# Patient Record
Sex: Female | Born: 1955 | Race: White | Hispanic: No | Smoking: Never smoker
Health system: Southern US, Community
[De-identification: ages and names within clinical notes are randomized; demographics above are authoritative.]

## PROBLEM LIST (undated history)

## (undated) DIAGNOSIS — I2510 Atherosclerotic heart disease of native coronary artery without angina pectoris: Secondary | ICD-10-CM

## (undated) DIAGNOSIS — F419 Anxiety disorder, unspecified: Secondary | ICD-10-CM

## (undated) DIAGNOSIS — I209 Angina pectoris, unspecified: Secondary | ICD-10-CM

## (undated) DIAGNOSIS — K219 Gastro-esophageal reflux disease without esophagitis: Secondary | ICD-10-CM

## (undated) DIAGNOSIS — E1140 Type 2 diabetes mellitus with diabetic neuropathy, unspecified: Secondary | ICD-10-CM

## (undated) DIAGNOSIS — Z9889 Other specified postprocedural states: Secondary | ICD-10-CM

## (undated) DIAGNOSIS — G4730 Sleep apnea, unspecified: Secondary | ICD-10-CM

## (undated) DIAGNOSIS — C189 Malignant neoplasm of colon, unspecified: Secondary | ICD-10-CM

## (undated) DIAGNOSIS — I639 Cerebral infarction, unspecified: Secondary | ICD-10-CM

## (undated) DIAGNOSIS — E7800 Pure hypercholesterolemia, unspecified: Secondary | ICD-10-CM

## (undated) DIAGNOSIS — M1990 Unspecified osteoarthritis, unspecified site: Secondary | ICD-10-CM

## (undated) DIAGNOSIS — R112 Nausea with vomiting, unspecified: Secondary | ICD-10-CM

## (undated) DIAGNOSIS — I519 Heart disease, unspecified: Secondary | ICD-10-CM

## (undated) DIAGNOSIS — I10 Essential (primary) hypertension: Secondary | ICD-10-CM

## (undated) DIAGNOSIS — E039 Hypothyroidism, unspecified: Secondary | ICD-10-CM

## (undated) DIAGNOSIS — Z85828 Personal history of other malignant neoplasm of skin: Secondary | ICD-10-CM

## (undated) DIAGNOSIS — E079 Disorder of thyroid, unspecified: Secondary | ICD-10-CM

## (undated) DIAGNOSIS — R079 Chest pain, unspecified: Secondary | ICD-10-CM

## (undated) DIAGNOSIS — F410 Panic disorder [episodic paroxysmal anxiety] without agoraphobia: Secondary | ICD-10-CM

## (undated) DIAGNOSIS — I219 Acute myocardial infarction, unspecified: Secondary | ICD-10-CM

## (undated) HISTORY — PX: KNEE SURGERY: SHX244

## (undated) HISTORY — PX: JOINT REPLACEMENT: SHX530

## (undated) HISTORY — PX: ABDOMINAL HYSTERECTOMY: SHX81

## (undated) HISTORY — PX: CHOLECYSTECTOMY: SHX55

## (undated) HISTORY — DX: Malignant neoplasm of colon, unspecified: C18.9

## (undated) HISTORY — DX: Hypothyroidism, unspecified: E03.9

## (undated) HISTORY — PX: CORONARY ANGIOPLASTY WITH STENT PLACEMENT: SHX49

## (undated) HISTORY — DX: Gastro-esophageal reflux disease without esophagitis: K21.9

## (undated) HISTORY — DX: Sleep apnea, unspecified: G47.30

## (undated) HISTORY — PX: BACK SURGERY: SHX140

## (undated) HISTORY — DX: Panic disorder (episodic paroxysmal anxiety): F41.0

## (undated) HISTORY — PX: ELBOW SURGERY: SHX618

## (undated) HISTORY — DX: Heart disease, unspecified: I51.9

## (undated) HISTORY — DX: Anxiety disorder, unspecified: F41.9

## (undated) HISTORY — DX: Pure hypercholesterolemia, unspecified: E78.00

## (undated) HISTORY — DX: Chest pain, unspecified: R07.9

---

## 1996-02-03 DIAGNOSIS — I219 Acute myocardial infarction, unspecified: Secondary | ICD-10-CM

## 1996-02-03 HISTORY — DX: Acute myocardial infarction, unspecified: I21.9

## 1999-05-29 ENCOUNTER — Ambulatory Visit (HOSPITAL_COMMUNITY): Admission: RE | Admit: 1999-05-29 | Discharge: 1999-05-29 | Payer: Self-pay | Admitting: Neurosurgery

## 1999-05-29 ENCOUNTER — Encounter: Payer: Self-pay | Admitting: Neurosurgery

## 1999-06-12 ENCOUNTER — Ambulatory Visit (HOSPITAL_COMMUNITY): Admission: RE | Admit: 1999-06-12 | Discharge: 1999-06-12 | Payer: Self-pay | Admitting: Neurosurgery

## 1999-06-12 ENCOUNTER — Encounter: Payer: Self-pay | Admitting: Neurosurgery

## 2000-08-27 ENCOUNTER — Encounter: Admission: RE | Admit: 2000-08-27 | Discharge: 2000-08-27 | Payer: Self-pay | Admitting: Neurosurgery

## 2000-08-27 ENCOUNTER — Encounter: Payer: Self-pay | Admitting: Neurosurgery

## 2000-09-07 ENCOUNTER — Ambulatory Visit (HOSPITAL_COMMUNITY): Admission: RE | Admit: 2000-09-07 | Discharge: 2000-09-07 | Payer: Self-pay | Admitting: Pulmonary Disease

## 2000-10-01 ENCOUNTER — Encounter: Payer: Self-pay | Admitting: Neurosurgery

## 2000-10-05 ENCOUNTER — Encounter: Payer: Self-pay | Admitting: Neurosurgery

## 2000-10-05 ENCOUNTER — Inpatient Hospital Stay (HOSPITAL_COMMUNITY): Admission: RE | Admit: 2000-10-05 | Discharge: 2000-10-12 | Payer: Self-pay | Admitting: Neurosurgery

## 2000-10-26 ENCOUNTER — Inpatient Hospital Stay (HOSPITAL_COMMUNITY): Admission: RE | Admit: 2000-10-26 | Discharge: 2000-11-11 | Payer: Self-pay | Admitting: Neurosurgery

## 2000-11-01 ENCOUNTER — Encounter: Payer: Self-pay | Admitting: Neurosurgery

## 2000-12-09 ENCOUNTER — Encounter: Payer: Self-pay | Admitting: Neurosurgery

## 2000-12-09 ENCOUNTER — Encounter: Admission: RE | Admit: 2000-12-09 | Discharge: 2000-12-09 | Payer: Self-pay | Admitting: Neurosurgery

## 2001-04-05 ENCOUNTER — Encounter: Admission: RE | Admit: 2001-04-05 | Discharge: 2001-04-05 | Payer: Self-pay | Admitting: Internal Medicine

## 2001-06-19 ENCOUNTER — Ambulatory Visit (HOSPITAL_COMMUNITY): Admission: RE | Admit: 2001-06-19 | Discharge: 2001-06-19 | Payer: Self-pay | Admitting: Neurosurgery

## 2001-06-19 ENCOUNTER — Encounter: Payer: Self-pay | Admitting: Neurosurgery

## 2001-07-12 ENCOUNTER — Ambulatory Visit (HOSPITAL_COMMUNITY): Admission: RE | Admit: 2001-07-12 | Discharge: 2001-07-12 | Payer: Self-pay | Admitting: Neurosurgery

## 2001-07-12 ENCOUNTER — Encounter: Payer: Self-pay | Admitting: Neurosurgery

## 2001-08-03 ENCOUNTER — Emergency Department (HOSPITAL_COMMUNITY): Admission: EM | Admit: 2001-08-03 | Discharge: 2001-08-04 | Payer: Self-pay | Admitting: Internal Medicine

## 2001-08-04 ENCOUNTER — Encounter: Payer: Self-pay | Admitting: Internal Medicine

## 2001-11-28 ENCOUNTER — Ambulatory Visit (HOSPITAL_COMMUNITY): Admission: RE | Admit: 2001-11-28 | Discharge: 2001-11-28 | Payer: Self-pay | Admitting: Pulmonary Disease

## 2001-12-08 ENCOUNTER — Ambulatory Visit (HOSPITAL_COMMUNITY): Admission: RE | Admit: 2001-12-08 | Discharge: 2001-12-08 | Payer: Self-pay | Admitting: Pulmonary Disease

## 2002-07-21 ENCOUNTER — Ambulatory Visit (HOSPITAL_COMMUNITY): Admission: RE | Admit: 2002-07-21 | Discharge: 2002-07-21 | Payer: Self-pay | Admitting: Pulmonary Disease

## 2002-08-14 ENCOUNTER — Encounter: Payer: Self-pay | Admitting: *Deleted

## 2002-08-14 ENCOUNTER — Encounter (HOSPITAL_COMMUNITY): Admission: RE | Admit: 2002-08-14 | Discharge: 2002-09-13 | Payer: Self-pay | Admitting: *Deleted

## 2002-09-08 ENCOUNTER — Ambulatory Visit (HOSPITAL_COMMUNITY): Admission: RE | Admit: 2002-09-08 | Discharge: 2002-09-08 | Payer: Self-pay | Admitting: Pulmonary Disease

## 2003-04-26 ENCOUNTER — Ambulatory Visit (HOSPITAL_COMMUNITY): Admission: RE | Admit: 2003-04-26 | Discharge: 2003-04-26 | Payer: Self-pay | Admitting: Neurosurgery

## 2003-06-01 ENCOUNTER — Ambulatory Visit (HOSPITAL_COMMUNITY): Admission: RE | Admit: 2003-06-01 | Discharge: 2003-06-01 | Payer: Self-pay | Admitting: Neurosurgery

## 2003-06-21 ENCOUNTER — Inpatient Hospital Stay (HOSPITAL_COMMUNITY): Admission: AD | Admit: 2003-06-21 | Discharge: 2003-06-23 | Payer: Self-pay | Admitting: Interventional Cardiology

## 2003-11-29 ENCOUNTER — Ambulatory Visit (HOSPITAL_COMMUNITY): Admission: RE | Admit: 2003-11-29 | Discharge: 2003-11-29 | Payer: Self-pay | Admitting: Pulmonary Disease

## 2004-01-10 ENCOUNTER — Ambulatory Visit: Payer: Self-pay | Admitting: Cardiology

## 2004-03-04 ENCOUNTER — Inpatient Hospital Stay (HOSPITAL_COMMUNITY): Admission: EM | Admit: 2004-03-04 | Discharge: 2004-03-07 | Payer: Self-pay | Admitting: Emergency Medicine

## 2004-06-04 ENCOUNTER — Inpatient Hospital Stay (HOSPITAL_COMMUNITY): Admission: EM | Admit: 2004-06-04 | Discharge: 2004-06-06 | Payer: Self-pay | Admitting: Emergency Medicine

## 2005-02-17 ENCOUNTER — Ambulatory Visit (HOSPITAL_COMMUNITY): Admission: RE | Admit: 2005-02-17 | Discharge: 2005-02-17 | Payer: Self-pay | Admitting: Interventional Cardiology

## 2005-09-26 ENCOUNTER — Ambulatory Visit (HOSPITAL_COMMUNITY): Admission: RE | Admit: 2005-09-26 | Discharge: 2005-09-26 | Payer: Self-pay | Admitting: Neurosurgery

## 2005-10-16 ENCOUNTER — Ambulatory Visit (HOSPITAL_COMMUNITY): Admission: RE | Admit: 2005-10-16 | Discharge: 2005-10-16 | Payer: Self-pay | Admitting: Neurosurgery

## 2006-11-02 ENCOUNTER — Encounter: Admission: RE | Admit: 2006-11-02 | Discharge: 2006-11-02 | Payer: Self-pay | Admitting: Interventional Cardiology

## 2006-11-08 ENCOUNTER — Inpatient Hospital Stay (HOSPITAL_BASED_OUTPATIENT_CLINIC_OR_DEPARTMENT_OTHER): Admission: RE | Admit: 2006-11-08 | Discharge: 2006-11-08 | Payer: Self-pay | Admitting: Interventional Cardiology

## 2008-11-13 ENCOUNTER — Inpatient Hospital Stay (HOSPITAL_BASED_OUTPATIENT_CLINIC_OR_DEPARTMENT_OTHER): Admission: RE | Admit: 2008-11-13 | Discharge: 2008-11-13 | Payer: Self-pay | Admitting: Interventional Cardiology

## 2010-02-22 ENCOUNTER — Encounter: Payer: Self-pay | Admitting: Neurosurgery

## 2010-02-23 ENCOUNTER — Encounter: Payer: Self-pay | Admitting: Obstetrics & Gynecology

## 2010-04-17 ENCOUNTER — Emergency Department (HOSPITAL_COMMUNITY): Payer: Medicare Other

## 2010-04-17 ENCOUNTER — Emergency Department (HOSPITAL_COMMUNITY)
Admission: EM | Admit: 2010-04-17 | Discharge: 2010-04-17 | Disposition: A | Discharge status: Home / Self Care | Payer: Medicare Other | Attending: Emergency Medicine | Admitting: Emergency Medicine

## 2010-04-17 DIAGNOSIS — E119 Type 2 diabetes mellitus without complications: Secondary | ICD-10-CM | POA: Insufficient documentation

## 2010-04-17 DIAGNOSIS — R059 Cough, unspecified: Secondary | ICD-10-CM | POA: Insufficient documentation

## 2010-04-17 DIAGNOSIS — J189 Pneumonia, unspecified organism: Secondary | ICD-10-CM | POA: Insufficient documentation

## 2010-04-17 DIAGNOSIS — I10 Essential (primary) hypertension: Secondary | ICD-10-CM | POA: Insufficient documentation

## 2010-04-17 DIAGNOSIS — Z79899 Other long term (current) drug therapy: Secondary | ICD-10-CM | POA: Insufficient documentation

## 2010-04-17 DIAGNOSIS — R05 Cough: Secondary | ICD-10-CM | POA: Insufficient documentation

## 2010-04-17 DIAGNOSIS — I2510 Atherosclerotic heart disease of native coronary artery without angina pectoris: Secondary | ICD-10-CM | POA: Insufficient documentation

## 2010-04-17 DIAGNOSIS — R509 Fever, unspecified: Secondary | ICD-10-CM | POA: Insufficient documentation

## 2010-04-17 LAB — BASIC METABOLIC PANEL
BUN: 12 mg/dL (ref 6–23)
CO2: 24 mEq/L (ref 19–32)
Calcium: 8.7 mg/dL (ref 8.4–10.5)
Chloride: 99 mEq/L (ref 96–112)
Creatinine, Ser: 0.99 mg/dL (ref 0.4–1.2)
GFR calc Af Amer: >60 mL/min (ref >60)
GFR calc non Af Amer: 58 mL/min — ABNORMAL LOW (ref >60)
Glucose, Bld: 294 mg/dL — ABNORMAL HIGH (ref 70–99)
Potassium: 4.2 mEq/L (ref 3.5–5.1)
Sodium: 135 mEq/L (ref 135–145)

## 2010-04-17 LAB — DIFFERENTIAL
Basophils Absolute: 0 10*3/uL (ref 0.0–0.1)
Basophils Relative: 0 % (ref 0–1)
Eosinophils Absolute: 0.2 10*3/uL (ref 0.0–0.7)
Eosinophils Relative: 3 % (ref 0–5)
Lymphocytes Relative: 16 % (ref 12–46)
Lymphs Abs: 1.4 10*3/uL (ref 0.7–4.0)
Monocytes Absolute: 0.6 10*3/uL (ref 0.1–1.0)
Monocytes Relative: 7 % (ref 3–12)
Neutro Abs: 6.1 10*3/uL (ref 1.7–7.7)
Neutrophils Relative %: 74 % (ref 43–77)

## 2010-04-17 LAB — CBC
HCT: 35.1 % — ABNORMAL LOW (ref 36.0–46.0)
Hemoglobin: 11.7 g/dL — ABNORMAL LOW (ref 12.0–15.0)
MCH: 30.1 pg (ref 26.0–34.0)
MCHC: 33.3 g/dL (ref 30.0–36.0)
MCV: 90.2 fL (ref 78.0–100.0)
Platelets: 165 10*3/uL (ref 150–400)
RBC: 3.89 MIL/uL (ref 3.87–5.11)
RDW: 13.7 % (ref 11.5–15.5)
WBC: 8.3 10*3/uL (ref 4.0–10.5)

## 2010-04-17 LAB — GLUCOSE, CAPILLARY: Glucose-Capillary: 287 mg/dL — ABNORMAL HIGH (ref 70–99)

## 2010-05-08 LAB — POCT I-STAT GLUCOSE
Glucose, Bld: 130 mg/dL — ABNORMAL HIGH (ref 70–99)
Operator id: 221371

## 2010-06-17 NOTE — Cardiovascular Report (Signed)
[name], [name]              ACCOUNT NO.:  [id number]   MEDICAL RECORD NO.:  [id number]          PATIENT TYPE:  OIB   LOCATION:  1962                         FACILITY:  [facility]   PHYSICIAN:  Lyn Records, M.D.   DATE OF BIRTH:  [birth year]   DATE OF PROCEDURE:  DATE OF DISCHARGE:                            CARDIAC CATHETERIZATION   INDICATIONS FOR PROCEDURE:  Recurring chest discomfort at rest in a  patient with known coronary disease, prior history of LAD stenting with  DES in May 2005.  Procedure is being done to document the patency of the  LAD and look for evidence of progression of disease.   PROCEDURE PERFORMED:  1. Left heart catheterization.  2. Selective coronary angiogram.  3. Left ventriculography.  4. Intracoronary nitro.   DESCRIPTION:  After informed consent, a 4-French sheath was placed in  the right femoral artery using a modified Seldinger technique.  A 4-  [place] A2 multipurpose catheter was used for hemodynamic recordings and  left ventriculography by hand injection.  The right coronary artery was  also selectively engaged using the multipurpose catheter.  A 4-French #4  left Judkins catheter was used for left coronary angiography.  The  patient tolerated the procedure without complications.   RESULTS:  1. Hemodynamic data:      a.     Aortic pressure 123/78.      b.     Left ventricular pressure 124/14.  2. Left ventriculography:  Left ventricular cavity size and function      are normal.  Ejection fraction is 60%.  No mitral regurgitation is      noted.  3. Coronary angiography.      a.     Left main coronary:  Widely patent.      b.     Left anterior descending coronary:  Large vessel wraps       around left ventricular apex.  Two regions of stenting are noted       in the proximal vessel followed by a skip area and then a long       segment of mid-vessel stents.  Irregularities are noted in the       distal vessel.  The stents are widely patent,  as is the segment       that is not stented between the regions of stent.  Flow in the LAD       is reduced and there is decreased septal blushing.  TIMI grade 2.5       flow was felt to be present.  No obvious obstructive lesion is       noted.      c.     Circumflex artery:  It is a large vessel with no significant       obstruction.  A large trifurcating first obtuse marginal, a small       second and third obtuse marginal, and a large fourth obtuse       marginal is noted.  No significant obstruction is noted in the       entire circumflex system.  d.     Right coronary:  The right coronary is dominant, gives off a       AV-nodal branch.  No obstruction is noted.  Luminal irregularities       are seen.   CONCLUSION:  1. Widely patent LAD stents.  2. Diminished flow through the LAD out towards the apex of uncertain      etiology, but not due to epicardial obstruction.  Therefore, I      suspect this is due to microvascular disease/obstructed      microcirculation either related to vasoconstriction or fixed      obstruction from previous PCI.  3. Normal circumflex and right coronary.   PLAN:  1. Optimize statin therapy.  2. Optimize ACE inhibitor therapy.  3. Increase nitrate intensity.  4. Clinical followup in 2 weeks.      Lyn Records, M.D.  Electronically Signed     HWS/MEDQ  D:  11/08/2006  T:  11/08/2006  Job:  [id number]   cc:   Ramon Dredge L. Juanetta Gosling, M.D.

## 2010-06-20 NOTE — Cardiovascular Report (Signed)
[name], [name]              ACCOUNT NO.:  [id number]   MEDICAL RECORD NO.:  [id number]          PATIENT TYPE:  INP   LOCATION:  3742                         FACILITY:  [facility]   PHYSICIAN:  Lyn Records III, M.D.DATE OF BIRTH:  12-Nov-1955   DATE OF PROCEDURE:  03/06/2004  DATE OF DISCHARGE:                              CARDIAC CATHETERIZATION   INDICATION FOR PROCEDURE:  Recurring episodes of chest tightness occurring  predominately arrest, partially responsive to sublingual nitroglycerin,  similar to chest discomfort present prior to percutaneous intervention in  May. The patient has a history of high-grade LAD disease treated with  overlapping proximal and mid LAD stents in May 2005. Study is being done to  rule out restenosis.   PROCEDURE:  1.  Left heart catheterization  2.  Selective coronary angiography.  3.  Left ventriculography.  4.  AngioSeal arteriotomy closure.   DESCRIPTION:  After informed consent, a 6-French sheath was placed in the  right femoral artery using a modified Seldinger technique. A 6-French A2  multipurpose catheter was used for hemodynamic recordings, left  ventriculography by hand injection and selective right coronary angiography.  A #4 left Judkins catheter was used for left coronary angiography. The  patient tolerated procedure without significant complications. AngioSeal  arteriotomy closure was performed post procedure without complications.  Sheathogram was performed to document adequate anatomy.   RESULTS:   I. HEMODYNAMIC DATA:  A. Left ventricular pressure 158/18.  B. Aortic pressure 158/92.   II. LEFT VENTRICULOGRAPHY:  The LV cavity size and function are normal, EF  60%, no mitral regurgitation.   III. CORONARY ANGIOGRAPHY:  A. Left main coronary: Normal.  B. Left anterior descending coronary: The LAD is the site of overlapping  stents that begin in the proximal vessel and extend to the mid-vessel. These  stents are widely  patent. There is an eccentric mid-region narrowing of  perhaps 20% on less this seen only in one view that is in the region of  overlap. There is also an eccentric narrowing of the distal stent outlet  that narrows the vessel by up to 30% to 40%; this is also seen only in a  couple of views. Most views reveal widely patent stent.  There is certainly  no evidence of any significant obstructive lesion. The LAD is a large,  tortuous and wraps around left ventricular apex. No side-branch occlusion is  noted.  C. Circumflex artery: Circumflex coronary artery gives origin to a large  branching first obtuse marginal that is widely patent, a smaller second and  third obtuse marginal that are present and widely patent. The fourth obtuse  marginal branch is also widely patent and is a bifurcating vessel in the  inferolateral wall. No obstruction seen in the circumflex.  D. Right coronary: The right coronary artery is large and widely patent,  giving origin to the PDA. There are luminal irregularities noted in the mid-  vessel.   CONCLUSION:  1.  Widely patent overlapping stents in the proximal/mid left anterior      descending with less than 40% narrowing in the any point. The  most      severe region of narrowing, which is 30% to 40%, is at the outlet of the      overlapping stent segment in the mid-vessel that is seen in only a      couple views and is very eccentric. Flow is TIMI grade 3.  The      circumflex and right coronary are widely patent.  2.  Normal left ventricular function.  3.  Possible etiology of recurrent chest pain is noncardiac versus coronary      artery spasm.   PLAN:  We will discontinue IV nitroglycerin. We will add Norvasc as an  antispasm therapy. Certainly, no evidence is present for the need for  intervention. If discomfort continues, GI evaluation may need to be done and  we may need to consider a perfusion study.      HWS/MEDQ  D:  03/06/2004  T:  03/06/2004   Job:  [id number]   cc:   Sidney Ace, [address] Dr. Lendon Colonel

## 2010-06-20 NOTE — Discharge Summary (Signed)
[place]. [facility]  Patient:    [name], [name] Visit Number: [id number] MRN: [id number]          Service Type: Attending:  Payton Doughty, M.D. Dictated by:   Payton Doughty, M.D. Adm. Date:  10/26/00 Disc. Date: 11/11/00                             Discharge Summary  ADMISSION DIAGNOSIS:  Probable wound infection.  DISCHARGE DIAGNOSIS:  Methicillin resistant Staphylococcus aureus wound infection.  PROCEDURE:  Incision and drainage of wound infection.  COMPLICATIONS:  None.  DISCHARGE STATUS:  Alive and well.  HISTORY OF PRESENT ILLNESS:  This is a 55 year old right-handed white girl whose history and physical is recounted in the chart.  She underwent a lumbar fusion three weeks prior to admission and developed some drainage and had a rigor.  On exam in the office, her incision was draining a bit so she was admitted for exploration of her incision.  MEDICATIONS:  Lasix, metoprolol, Synthroid, Valium, Paxil, Remeron, and Flexeril.  ALLERGIES:  MORPHINE and E-VISTA.  PHYSICAL EXAMINATION:  General exam was remarkable for incision with small amount of drainage.  Neurologically, she was intact.  She was admitted from the office and taken to the operating room and underwent incision and drainage of the lumbar incision. There was MRSA recovered from the incision.  She was placed on vancomycin and Rifampin.  HOSPITAL COURSE:  A program of dressing changes was instituted, wet-to-dry with normal saline.  She has been doing reasonably well.  After several days, her temperature has defervesced.  Her incision is granulating well. Arrangements have been made for her to receive home antibiotics, she will also have dressing changes b.i.d. via [facility]. She complained in the hospital of some cramping in her legs.  Her electrolytes are normal and her strength is full.  She is now being discharged home with the above mentioned plan, on  Rifampin.  FOLLOW-UP:  This will be in about 10 days in the [facility] office for wound check. Dictated by:   Payton Doughty, M.D. Attending:  Payton Doughty, M.D. DD:  11/11/00 TD:  11/11/00 Job: [id number]

## 2010-06-20 NOTE — Op Note (Signed)
[place]. [facility]  Patient:    [name], [name] Visit Number: [id number] MRN: [id number]          Service Type: SUR Location: 3000 [id number] Attending Physician:  Emeterio Reeve Dictated by:   Payton Doughty, M.D. Proc. 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  OPERATION PERFORMED:  L4-5, L5-S1 laminectomy, diskectomy and posterior lumbar interbody fusion with Ray threaded fusion cage.  SURGEON:  Payton Doughty, M.D.  ANESTHESIA:  General endotracheal.  PREP:  Sterile Betadine prep and scrub with alcohol wipe.  COMPLICATIONS:  None.  ASSISTANT:  Tanya Nones. Jeral Fruit, M.D.  INDICATIONS FOR PROCEDURE:  The patient is a 55 year old right-handed white female with severe spondylosis at 4-5 and 5-1 and positive discography.  DESCRIPTION OF PROCEDURE:  The patient was taken to the operating room, smoothly anesthetized and intubated, and placed prone on the operating table. Following shave, prep and drape in the usual sterile fashion, the skin was infiltrated with 1% lidocaine with 1:400,000.  The skin was incised from mid-S1 to the bottom of L3 and the lamina of L4, L5 and S1 were exposed bilaterally and the subperiosteal plane out over the facet joints. Intraoperative x-ray confirmed correctness of level.  The pars interarticularis, lamina and inferior facet of L4 and S5 and the superior facets of L5 and S1 were removed bilaterally.  Ligamentum flavum was removed at each level and the L4, L5 and S1 nerve roots were dissected free as they rounded their respective pedicles.  There was extensive lateral recess narrowing worse on the left than on the right of the 5 root.  Following complete decompression of the roots diskectomy was carried out bilaterally at both levels.  The 4-5 disk was extensively  degenerated.  5-1 was not so as extensively degenerated but was still compressing the L5 root and the lateral recess on the left side.  Following complete diskectomy, Ray threaded fusion cages were placed, 14 x 21 mm at L4-5 and 16 x 21 mm at L5-S1.  The cages were packed with bone harvested from the facet joints and capped.  Intraoperative x-ray showed good placement of cages.  They were capped.  Incision was irrigated and meticulous hemostasis obtained.  The fascia was reapproximated with 0 Vicryl in interrupted fashion.  Subcutaneous tissues were reapproximated with 0 Vicryl in interrupted fashion.  Subcuticular tissue was reapproximated with 3-0 Vicryl in interrupted fashion.  The skin was closed in 3-0 nylon in a running locked fashion.  Betadine Telfa dressing was applied and made occlusive with OpSite.  The patient was transferred to the recovery room in good condition. Dictated by:   Payton Doughty, M.D. Attending Physician:  Emeterio Reeve DD:  10/05/00 TD:  10/05/00 Job: [id number]

## 2010-06-20 NOTE — Procedures (Signed)
   NAMEJAHMYA, ONOFRIO                          ACCOUNT NO.:  [id number]   MEDICAL RECORD NO.:  [id number]                  PATIENT TYPE:  PREC   LOCATION:                                       FACILITY:   PHYSICIAN:  Vida Roller, M.D.                DATE OF BIRTH:  07-Mar-1955   DATE OF PROCEDURE:  08/14/2002  DATE OF DISCHARGE:                                    STRESS TEST   ADENOSINE CARDIOLITE:   INDICATION:  Ms. Reamer is a 55 year old female with no known coronary  artery disease who had her last dobutamine Cardiolite in 1999 for atypical  chest discomfort which revealed no ischemia.  She now presents with atypical  chest discomfort.  Her cardiac risk factors include hypertension and family  history.  She had a recent echocardiogram in June 2004 which was within  normal limits.  She does state that she has a history of severe asthma with  the last hospitalization approximately six months ago.  She states the last  time she used her inhaler was about two weeks ago.   BASELINE DATA:  EKG shows sinus rhythm at 97 beats per minute with  nonspecific ST abnormalities.  Blood pressure is 162/92.  On exam, the  patient's lungs are clear to auscultation bilaterally with no wheezes.   The patient was premedicated with albuterol inhaler two puffs prior to the  start of this adenosine Cardiolite.  Adenosine 60 mg was infused over a four  minute protocol with Cardiolite injected at three minutes.  The patient  reported chest tightness, nausea, and flushing which resolved in recovery.  She did not have any signs of wheezing.   EKG shows no arrhythmias and no ischemic changes.   Final images and results are pending M.D. review.     Amy Mercy Riding, P.A. [facility]                     Vida Roller, M.D.    AB/MEDQ  D:  08/14/2002  T:  08/14/2002  Job:  [id number]

## 2010-06-20 NOTE — Cardiovascular Report (Signed)
[name], [name]              ACCOUNT NO.:  [id number]   MEDICAL RECORD NO.:  [id number]          PATIENT TYPE:  OIB   LOCATION:  2899                         FACILITY:  [facility]   PHYSICIAN:  Lyn Records, M.D.   DATE OF BIRTH:  10-12-1955   DATE OF PROCEDURE:  02/17/2005  DATE OF DISCHARGE:                              CARDIAC CATHETERIZATION   INDICATIONS FOR PROCEDURE:  The patient is studied for two reasons:  a. Class III symptoms.  b. Restudy for Stradivarius trial via [employer].   PROCEDURE PERFORMED:  1.  Left heart cath.  2.  Selective coronary.  3.  Left ventriculography.  4.  Intravascular ultrasound circumflex coronary artery.   DESCRIPTION:  After informed consent 6-French sheath was placed in the right  femoral artery using modified Seldinger technique.  A 6-French A2  multipurpose catheter was then used for hemodynamic recordings, left  ventriculography by hand injection, and selective left and right coronary  angiography.  Then 200 mcg of intracoronary nitroglycerin was administered  into both coronary arteries.  After review of the digital display we  proceeded with intravascular ultrasound of the circumflex.  We used an XB  3.5 left system 6-French guide catheter and a Biomedical engineer.  We  had some difficulty getting distally in the large third obtuse marginal  branch but we were able to eventually accomplish this.  We then performed  intravascular ultrasound using the [employer] system.  The ultrasound was  performed without complications.  Then 200 mcg of intracoronary  nitroglycerin was given postprocedure and a final angiogram was performed to  demonstrate a stability of anatomy.  The patient received 3000 units of IV  heparin before instrumentation of the coronary os with the wire.  ACT was  documented to be 230 seconds prior to manipulation.  We were unable to close  using Angio-Seal because of the insertion site being at a branch  point.   RESULTS:  1.  Hemodynamic data:      1.  Aortic pressure 125/83.      2.  Left ventricle pressure 125/11.  2.  Left ventriculography:  Normal.  EF 60%.  3.  Coronary angiography:      1.  Left main coronary:  Normal.      2.  Left anterior descending coronary:  The proximal and mid LAD are          widely patent.  Cipher stents were present in the proximal and mid          segment.  Minimal luminal irregularities are noted throughout the          LAD.  LAD is transapical.  No stenosis greater than 20% is felt to          be present.      3.  Circumflex artery:  Normal.      4.  Ramus branch:  Widely patent and normal.      5.  Right coronary:  The right coronary is dominant.  PDA and LV branch  arises distally.  The mid less than 25% stenosis is noted after a          mid acute marginal branch. This stenosis appears to be less          significant than on prior angiograms.  4.  Intravascular ultrasound:  Intravascular ultrasound was completed      starting in the distal third of the circumflex and imaging all the way      back to the left main.  Minimal plaque was noted in the vessel.   CONCLUSION:  1.  Widely patent left and right coronary systems with widely patent      proximal and mid LAD stents (Cipher drug-eluting stents placed in 2006,      February.  2.  Normal LV function.  3.  Successful intravascular ultrasound of the circumflex without      complications.   PLAN:  Continue medical therapy.      Lyn Records, M.D.  Electronically Signed     HWS/MEDQ  D:  02/17/2005  T:  02/17/2005  Job:  [id number]   cc:   Corinda [employer] research   Ramon Dredge L. Juanetta Gosling, M.D.  Fax: [phone]
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
## 2010-06-20 NOTE — Discharge Summary (Signed)
[name], [name]              ACCOUNT NO.:  [id number]   MEDICAL RECORD NO.:  [id number]          PATIENT TYPE:  INP   LOCATION:  3742                         FACILITY:  [facility]   PHYSICIAN:  Lyn Records, M.D.   DATE OF BIRTH:  04-Feb-1955   DATE OF ADMISSION:  03/04/2004  DATE OF DISCHARGE:  03/07/2004                                 DISCHARGE SUMMARY   ADMISSION DIAGNOSES:  1.  Chest pain, rule out myocardial infarction.  2.  Known coronary artery disease.  3.  Hyperlipidemia.  4.  Noninsulin-dependent diabetes mellitus.  5.  Hypothyroid.   DISCHARGE DIAGNOSES:  1.  Chest pain, negative for Myocardial infarction by cardiac enzymes and      EKG.  2.  Known coronary artery disease status post Cypher stent placement x2 to      the proximal and mid LAD Jun 22, 2003.  3.  Widely patent LAD stent by cardiac catheterization March 06, 2004.  4.  Noninsulin-dependent diabetes mellitus, controlled.  5.  Hyperlipidemia on Statin therapy.  6.  Hypothyroid, stable on Synthroid.   PROCEDURE:  Cardiac catheterization March 06, 2004.   COMPLICATIONS:  None.   DISPOSITION:  Discharge status stable.  Improved.   HISTORY OF PRESENT ILLNESS:  Please see complete H&P for details but in  short this is a 55 year old female with known CAD.  She underwent Cypher  stent placement x2 to the proximal and mid LAD Jun 22, 2003.  She presented  to [facility] in [place] with complaint of intermittent chest pressure  related to activity unrelieved with sublingual nitroglycerin.  They are  associated with shortness of breath and would radiate through to her back  and left shoulder and arm.  She was transferred from [facility] to  [facility] for further treatment and evaluation.  She was given  sublingual nitroglycerin and then placed in Nitrol paste and given IV  morphine.  IV nitroglycerin was started and she had significant decrease in  her pain.   PHYSICAL EXAMINATION:   GENERAL:  Please see complete H&P.  VITAL SIGNS:  Vital signs were stable.  She was afebrile.   DIAGNOSTIC STUDIES:  Chest x-ray showed no acute process.  Admission labs  including CBC, BMP were normal with the exception of hypokalemia at 3.2.  Initial cardiac enzymes were also normal.  Physical exam was essentially  without any abnormalities.   HOSPITAL COURSE:  She was admitted to rule out MI with serial enzymes.  She  was continued on aspirin, Plavix, beta blocker and IV nitroglycerin.  Subcutaneous Lovenox was added.  Plans for cardiac catheterization were made  for the following morning.   Vital signs remained stable throughout her admission.  She was taken to the  cardiac catheterization lab by Dr. Katrinka Blazing on March 06, 2004 secondary to  delay in the cath lab which delayed her procedure to the following day.  She  remained stable overnight.   Results of cardiac catheterization showed a normal left main, left  circumflex and RCA.  Stents to the LAD were widely patent.  LV function was  normal.  At this point there was question as to whether she may have had a  coronary artery spasm as the cause of her symptoms.  She remained stable  overnight.  Afebrile.  Normal sinus on telemetry.  She had some recurrent  very mild chest pressure but nothing as to what brought her to the hospital  initially.  The following morning her right groin cath was without bruising  or hematoma.  She was discharged home without further incident.   DISCHARGE MEDICATIONS:  1.  Norvasc 5 mg daily.  2.  Lasix 40 mg daily.  3.  Plavix 75 mg daily.  4.  Aspirin 325 mg daily.  5.  Lopressor 100 mg q.a.m. and 50 mg q.p.m.  6.  Glyburide/Metformin 10/500 mg daily.  7.  Vytorin 10/20 mg daily.  8.  Nitroglycerin 0.4 mg p.r.n.   DISCHARGE INSTRUCTIONS:  She was instructed not to undertake any strenuous  physical activity for the next two days i.e., no lifting more than five  pounds, bending, stooping or  straining.  She was instructed not to drive on  the day of discharge.  No sexual activity for the next two days.   She is to maintain a low fat, low cholesterol diet as well as maintain her  diabetic diet restrictions.  She may shower and gently wash her right groin  cath site with warm soap and water.  She was instructed to pat the area dry  and not to rub it dry.   She has an appointment to see Dr. Katrinka Blazing for follow up on Monday, March 24, 2004 at 1 p.m.      HB/MEDQ  D:  05/08/2004  T:  05/08/2004  Job:  [id number]

## 2010-06-20 NOTE — Cardiovascular Report (Signed)
NAME:  [name], [name]                        ACCOUNT NO.:  [id number]   MEDICAL RECORD NO.:  [id number]                   PATIENT TYPE:  INP   LOCATION:  6525                                 FACILITY:  [facility]   PHYSICIAN:  Lesleigh Noe, M.D.            DATE OF BIRTH:  31-Aug-1955   DATE OF PROCEDURE:  06/22/2003  DATE OF DISCHARGE:  06/23/2003                              CARDIAC CATHETERIZATION   PROCEDURE:  Percutaneous coronary intervention.   INDICATIONS FOR PROCEDURE:  1. Progressive angina pectoris, high grade LAD documented by cath Jun 21, 2003.  2. Enrollment in a Stradivarius study with IVUS of the circumflex.   DESCRIPTION:  After informed consent, a 6 French sheath was started in the  right femoral artery using a modified Seldinger technique.  The patient was  already on IV Integrilin.  After sheath was in place 5,000 units of IV  heparin was administered and ACT was documented to be 286 seconds.  We  performed angioplasty and stenting of the proximal and mid LAD segments  using a 6 French 3.0 CLS guide catheter, a medium Asahi guide wire and a 2.5  mm x 15 mm Maverick balloon.  After predilatation we deployed a 23 x 3.0 mm  Cypher stent in the mid vessel and post dilated with a 3.25 15 mm Quantum to  15 atmospheres.  We also deployed a proximal 18 mm x 3.5 mm Cypher stent and  deployed to 15 atmospheres.  Post angiographic images were acceptable and  TIMI grade 3 flow was noted.   We then placed the guide wire into the distal circumflex out into the distal  obtuse marginal branch and we IVUSed from the distal third of the circumflex  all the way back into the left main.  No complications occurred.  200 mcg of  intracoronary nitroglycerin was administered prior to IVUS of the  circumflex.   CONCLUSIONS:  1. Successful percutaneous transluminal coronary angioplasty and stent of     the proximal and mid left anterior descending from 95 and 70% to 0% and  less than 10% with TIMI grade 3 flow.  2. Successful intracoronary vascular ultrasound of the distal third of the     circumflex back into the left main and enrollment in the Stradivarius     study.   PLAN:  1. Aspirin and Plavix for at least 12 months.  2. Integrilin x12 hours.  3. Discharge Jun 23, 2003.                                               Lesleigh Noe, M.D.    HWS/MEDQ  D:  06/22/2003  T:  06/24/2003  Job:  [id number]   cc:   Ramon Dredge L. Juanetta Gosling, M.D.  [address]  [place]  [place] [address]  Fax: [phone]   Payton Doughty, M.D.  [address]  [place]  [place] [address]  Fax: [phone]

## 2010-06-20 NOTE — Cardiovascular Report (Signed)
NAME:  MAREESA, [name]                        ACCOUNT NO.:  [id number]   MEDICAL RECORD NO.:  [id number]                   PATIENT TYPE:  INP   LOCATION:  3710                                 FACILITY:  [facility]   PHYSICIAN:  Lesleigh Noe, M.D.            DATE OF BIRTH:  Aug 11, 1955   DATE OF PROCEDURE:  06/21/2003  DATE OF DISCHARGE:                              CARDIAC CATHETERIZATION   INDICATIONS FOR PROCEDURE:  Abnormal EKG with poor R wave progression  suggesting anterior myocardial infarction.  Ongoing recurrent chest symptoms  consistent with angina pectoris.  Prior negative cardiac evaluation within  the past year using Cardiolite scintigraphy done by another practice.   DATE OF PROCEDURE:  Jun 21, 2003.   PROCEDURE PERFORMED:  1. Left heart catheterization.  2. Selective coronary angiography.  3. Left ventriculography.   DESCRIPTION:  After informed consent, a 4-French sheath was placed in the  right femoral artery using modified Seldinger technique.  A 4-French #4  right Judkins catheter was used for right coronary angiography, left  ventriculography by hand injection and hemodynamic recordings.  A #4 4  French left Judkins catheter was used for left coronary angiography.  We  identified high grade LAD disease with TIMI-2-3 flow.  During coronary  injections, chest discomfort occurred.  IV nitroglycerin was started.  The  patient will be admitted to the hospital.   RESULTS:   I. HEMODYNAMIC DATA:  A.  Aortic pressure 125/78.  B.  Left ventricular pressure 125/11.   II. LEFT VENTRICULOGRAPHY:  Overall LV size and function was normal.  EF is  65%.   III. CORONARY ANGIOGRAPHY:  A.  Left main coronary:  Free of any significant  obstruction.  B.  Left anterior descending coronary:  The LAD is a large vessel that wraps  around the left ventricular apex and his highly diseased.  There is a focal  95% stenosis proximally and in the mid vessel there is segmental  70-80%  narrowing.  The LAD is transapical.  C.  Circumflex artery:  Circumflex is large and gives three obtuse marginals  and is free of significant obstruction.  D.  Ramus branch:  A small to moderate size ramus branch and is free of any  obstruction.  E.  Right coronary artery:  The right coronary is moderate in size and free  of significant obstruction.   ASSESSMENT:  1. Significant high grade left anterior descending disease accounting for     the patient's symptoms.  2. Normal left ventricular function with normal regional wall motion.   PLAN:  Accelerating angina and the diminished flow in the LAD dictate that  the patient be admitted to the hospital and started on antiplatelet therapy  and antithrombotic therapy with plan to proceed with percutaneous coronary  intervention within the next 24 hours depending upon the patient's course.  Lesleigh Noe, M.D.    HWS/MEDQ  D:  06/21/2003  T:  06/22/2003  Job:  [id number]   cc:   Payton Doughty, M.D.  [address]  [place]  [place] [address]  Fax: [phone]

## 2010-06-20 NOTE — Procedures (Signed)
   [name], [name]                          ACCOUNT NO.:  [id number]   MEDICAL RECORD NO.:  [id number]                    PATIENT TYPE:   LOCATION:                                       FACILITY:   PHYSICIAN:  Edward L. Juanetta Gosling, M.D.             DATE OF BIRTH:   DATE OF PROCEDURE:  10/17/2002  DATE OF DISCHARGE:                              PULMONARY FUNCTION TEST   IMPRESSION:  1. Spirometry shows mild ventilatory defect without definite airflow     obstruction.  The flow volume loop is more consistent with restrictive     change, however.  2. Arterial blood gases show normal oxygenation.                                               Edward L. Juanetta Gosling, M.D.    ELH/MEDQ  D:  10/17/2002  T:  10/17/2002  Job:  [id number]

## 2010-06-20 NOTE — Cardiovascular Report (Signed)
NAMEVINCY, [name]              ACCOUNT NO.:  [id number]   MEDICAL RECORD NO.:  [id number]          PATIENT TYPE:  INP   LOCATION:  2931                         FACILITY:  [facility]   PHYSICIAN:  Lyn Records III, M.D.DATE OF BIRTH:  07/13/1955   DATE OF PROCEDURE:  06/05/2004  DATE OF DISCHARGE:                              CARDIAC CATHETERIZATION   INDICATION FOR THE STUDY:  Chest discomfort, prolonged, suspicious for  unstable angina in a patient who in May of 2005 underwent tandem stent  implantation in the proximal and mid LAD with resolution of anginal-quality  chest pain.  She began having recurrent pain in late 2005.  Had diagnostic  catheterization done in February of 2005 that demonstrated patent stents and  has gradually gotten worse and worse since that time.  This is a restudy in  this patient who presented with prolonged pain requiring IV nitroglycerin  for resolution.   PROCEDURE PERFORMED:  1.  Left heart catheterization.  2.  Selective coronary angiography.  3.  Left ventriculography.  4.  Intravascular ultrasound of the left anterior descending to check stent      patency, intravascular ultrasound of the circumflex to complete the      clinical trial that the patient had enrolled in.  This trial was called      the STRADIVARIUS trial.  Intravascular ultrasound of the right coronary      to answer the clinical question about significance of a borderline-      appearing lesion in the mid right.  5.  AngioSeal arteriotomy closure.   DESCRIPTION:  The patient was brought to the catheterization laboratory.  She was emotionally distraught.  She had consented to the procedure.  We  gave a total of approximately 5 mg of intravenous Versed.  She received over  100 mcg of intravenous fentanyl and additionally 2 mg of IV morphine.  A 6-  French sheath was placed using the modified Seldinger technique after  subcutaneous infiltration of Xylocaine.   We then performed  left and right heart catheterization using a 6-French A2  multipurpose catheter.  We then performed intravascular ultrasound of the  left coronary, circumflex, and right coronary.  We used a BMW wire in the  LAD and performed ultrasound documenting widely patent stent in the mid and  proximal LAD.  We then performed circumflex intravascular ultrasound to  complete restudy requirements for the STRADIVARIUS trial.  We then used a  JR4 6-French side-hole catheter and to help was performed intravascular  ultrasound on the mid right coronary.  Intravascular ultrasound demonstrated  a 60-65% cross-area stenosis in the mid RCA, clearly borderline, but less  than significant based upon 75% cross-area stenosis representing significant  clinical obstruction.   The intravascular ultrasound was performed after 50 units/kg of intravenous  heparin.  ACT was documented to be above 240 seconds throughout the entire  intravascular ultrasound in three vessels.  We used a BMW wire in the LAD, a  Prowater wire in the circumflex, and a BMW wire in the right coronary.  The  intravascular ultrasound was performed using the  Scimed Atlantis SR Plus  intravascular ultrasound system.  The mid and proximal LAD was performed.  The distal to proximal circumflex was performed and the mid RCA was  performed.  Intracoronary nitroglycerin was given into each vessel prior to  performing the procedure.  Postprocedure AngioSeal arteriotomy closure was  performed with good hemostasis.   RESULTS:   I. HEMODYNAMIC DATA:  A.  Aortic pressure 158/92.  B.  Left ventricular pressure 158/18.   II. LEFT VENTRICULOGRAPHY:  The left ventricle is normal in size.  Contractility is normal.  EF is greater than 65%.   III. CORONARY ANGIOGRAPHY:  A.  Left main coronary:  Widely patent.  B.  Left anterior descending coronary:  The left anterior descending  coronary contains proximal stent and a mid stent.  There is a gap between  the  stent sites.  The first septal perforators is pinched by the proximal  stent.  There is TIMI grade 3 antegrade flow in the first septal perforator.  The second septal perforator is also pinched by the mid stent.  The LAD is  widely patent.  No significant diagonal obstructions are noted.  The LAD is  widely patent around the apex.  C.  Ramus branch:  The ramus is large and free of any significant  obstruction.  It trifurcates on the left lateral wall.  D.  Circumflex artery:  Circumflex artery is large.  Gives origin to four  small obtuse marginal branches.  The distal-most obtuse marginal branch is  the largest and it is the third obtuse marginal.  No significant obstruction  is noted.  E.  Right coronary:  The right coronary is dominant.  Gives a large PDA.  The mid vessel contains a borderline stenosis that in multiple views looks  to be in the 50% range.   IV. INTRAVASCULAR ULTRASOUND:  A.  Intravascular ultrasound of the LAD  stents reveals wide patency throughout the stented region.  There is a gap  between the proximal and mid stent.  No significant obstruction is noted.  B.  Circumflex intravascular ultrasound revealed a widely patent vessel.  This completed requirements for the STRADIVARIUS trial which was done  coincidentally since the patient was in the catheterization laboratory.  C.  The right coronary contained a 63.7% cross-area stenosis in the mid  vessel in the same region as the 50% visual stenosis.  This was not felt to  be clinically significant as 75% cross-area stenosis is required for what is  felt to be clinical significance.   V. INTRAVASCULAR ULTRASOUND:  Performed without complications with good  hemostasis.   CONCLUSION:  1.  Widely patent left anterior descending stents.  2.  Normal circumflex and ramus.  3.  A 65% cross-area stenosis in the mid right coronary artery, 50% by      visual inspection.  We did not feel this was clinically significant. 4.   Normal left ventricular function.  5.  Perhaps the patient's chest pain is related to pinching of the first and      second septal perforators by the previously placed stents.  This seems      somewhat unlikely since the patient has had this anatomy since the      stents were implanted   PLAN:  Continue medical therapy.  Discontinue IV nitroglycerin.  Clinical  observation.  Risk factor modification.      HWS/MEDQ  D:  06/05/2004  T:  06/05/2004  Job:  [id number]

## 2010-06-20 NOTE — H&P (Signed)
NAMESINDY, MCCUNE              ACCOUNT NO.:  [id number]   MEDICAL RECORD NO.:  [id number]          PATIENT TYPE:  INP   LOCATION:  1830                         FACILITY:  [facility]   PHYSICIAN:  Lyn Records, M.D.   DATE OF BIRTH:  February 05, 1955   DATE OF ADMISSION:  06/04/2004  DATE OF DISCHARGE:                                HISTORY & PHYSICAL   HISTORY OF PRESENT ILLNESS:  [name] is a 55 year old white woman who  is admitted to [facility] for further evaluation of chest pain.   The patient has a history of coronary artery disease.  She has previously  suffered an acute myocardial infarction.  In addition, she has undergone  placement of numerous stents.  The patient was transported from [facility] to [facility] for further management of chest pain.  The  patient presented there with a history of chest pain which began at  approximately 8 p.m. this evening.  This occurred while she was watching  television.  The chest pain was described as a pressure in the center of her  chest.  There was also an associated sharp component.  It appeared not to be  related to position, activity, meals, or respirations.  There was associated  dyspnea, diaphoresis, and nausea.  She took several nitroglycerin tablets,  but these seemed not to have an effect.  In the emergency department at  [facility], a chest CT was performed.  This reportedly  demonstrated no evidence of pulmonary embolus or aortic dissection.  The  chest pain has largely subsided, though is not completely gone.  The total  duration of her significant chest pain was several hours.  The chest pain is  worse with deep inspiration and rotation of the torso.   There is no history of congestive heart failure or arrhythmia.   PAST MEDICAL HISTORY:  Other medical problems include:  1.  Hypertension.  2.  Dyslipidemia.  3.  Diabetes mellitus.  4.  Hypothyroidism.  5.  Depression.  6.   Migraine headaches.  7.  Gastroesophageal reflux.   SOCIAL:  The patient lives alone.  She neither smokes nor drinks.  She does  not work.   MEDICATIONS:  Metformin, Glyburide, Plavix, Norvasc, nitroglycerin, and  Imdur.   ALLERGIES:  None.   OPERATIONS:  1.  Hysterectomy.  2.  Cholecystectomy.  3.  Appendectomy.  4.  Back surgery.   FAMILY HISTORY:  Both of her parents have hypertension.   REVIEW OF SYSTEMS:  Review of systems reveals no new problems related to her  head, eyes, ears, nose, mouth, throat, lungs, gastrointestinal system,  genitourinary system, or extremities.  There is no history of neurologic or  psychiatric disorder.  There is no history of fever, chills, or weight loss.   PHYSICAL EXAMINATION:  VITAL SIGNS:  Blood pressure 125/60.  Pulse 87 and  regular.  Respirations 16.  Temperature 98.3.  GENERAL:  The patient was an obese, middle-aged woman in no discomfort.  She  was alert, oriented, appropriate, and responsive.  HEENT:  Head, eyes, ears, nose  and mouth were normal.  NECK:  The neck was without thyromegaly or adenopathy.  Carotid pulses were  palpable bilaterally and without bruits.  CARDIAC:  Examination revealed a normal S1 and S2.  There was no S3, S4,  murmur, rub, or click.  Cardiac rhythm was regular.  The chest pain was  exacerbated by palpation of the left anterior chest.  LUNGS:  The lungs were clear.  ABDOMEN:  The abdomen was soft and nontender.  There was no mass,  hepatosplenomegaly, bruit, distention, rebound, guarding, or rigidity.  Bowel sounds were normal.  BREASTS, PELVIC AND RECTAL:  Examinations were not performed as they were  not pertinent to the reason for acute care hospitalization.  EXTREMITIES:  The extremities were without edema, deviation, or deformity.  Radial and dorsalis pedal pulses were palpable bilaterally.  NEUROLOGIC:  Brief screening neurologic survey was unremarkable.   LABORATORY AND ACCESSORY CLINICAL DATA:   The electrocardiogram from [facility]  revealed normal sinus rhythm.  There was evidence of a prior anterior  myocardial infarction.   The chest radiograph report was pending at the time of this dictation.   BUN was 17, creatinine 1.0, and potassium 3.3.  Total CK was 193 with a CK-  MB of 2.1, index of 1.0, and troponin of 0.01.  The remaining studies were  pending at the time of this dictation.   IMPRESSION:  1.  Chest pain; rule out unstable angina.  There are some atypical      components:  The chest pain is worse with inspiration, rotation of the      torso, and palpation of the chest wall.  The chest CT from [facility] is      reportedly negative for pulmonary embolus and aortic dissection.  2.  Coronary artery disease; status post myocardial infarction; status post      stents.  Awaiting hospital record for more details.  3.  Hypertension.  4.  Dyslipidemia.  5.  Diabetes mellitus.  6.  Hypothyroidism.  7.  Depression.  8.  Migraine headaches.  9.  Gastroesophageal reflux.   PLAN:  1.  Cardiac stepdown unit.  2.  Serial cardiac enzymes.  3.  Aspirin.  4.  Intravenous heparin.  5.  Intravenous nitroglycerin.  6.  Metoprolol.  7.  Continue Plavix.  8.  Hold Glucophage.  9.  Further measures per Dr. Katrinka Blazing.      MSC/MEDQ  D:  06/04/2004  T:  06/04/2004  Job:  [id number]
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
## 2010-06-20 NOTE — H&P (Signed)
[place]. [facility]  Patient:    [name], [name] Visit Number: [id number] MRN: [id number]          Service Type: Attending:  Payton Doughty, M.D. Dictated by:   Payton Doughty, M.D. Adm. Date:  10/26/00                           History and Physical  ADMITTING DIAGNOSES:  Probable wound infection.  HISTORY:  This is a now 55 year old right-handed white girl who underwent a lumbar fusion about three weeks ago.  Postoperatively she did reasonably well following that she had a small amount of drainage from her incision.  Sutures removed.  She was placed on ciprofloxacin but it has increased drainage now. Although she has not had a fever, has had a rigor or two and I feels needs to have her incision explored.  PAST MEDICAL HISTORY:  Hypertension.  MEDICATIONS:  Lasix, metoprolol, Synthroid, Valium for panic attacks, Paxil for depression, Remeron at night for sleep and help with depression, Flexeril on a p.r.n. basis, Tens unit three or four times a week.  Goes to a [place].  ALLERGIES:  MORPHINE, EVISTA.  SOCIAL HISTORY:  She does not smoke and does not drink.  She is on disability.  FAMILY HISTORY:  Mom is 15, in poor health with COPD.  Dad died at 8 with myocardial infarction.  PAST SURGICAL HISTORY:  Hysterectomy 1982, herniorrhaphy 1995, elbow operation 1996, knee operation 1994.  REVIEW OF SYSTEMS:  Remarkable for numerous pneumatic complaints.  She has had feelings that her knees are giving out.  PHYSICAL EXAMINATION  HEENT:  Within normal limits.  NECK:  Reasonable range of motion.  CHEST:  Clear.  CARDIAC:  Regular rate and rhythm.  ABDOMEN:  Nontender.  No hepatosplenomegaly.  EXTREMITIES:  Without clubbing or cyanosis.  Peripheral pulses are good.  GENITOURINARY:  Deferred.  BACK:  Some drainage from the center of the incision.  It is slightly discolored, but not malodorous, and is clear.  NEUROLOGIC:  Intact regarding  cranial nerves, motor and sensory examination, and reflexes.  CLINICAL IMPRESSION:  Possible for wound infection.  She is a very large girl. This may represent fat necrosis; however, I think that it probably is infected and needs to be explored and packed.  The risks and benefits of this approach have been discussed with her and she wishes to proceed. Dictated by:   Payton Doughty, M.D. Attending:  Payton Doughty, M.D. DD:  10/26/00 TD:  10/26/00 Job: [phone]

## 2010-06-20 NOTE — Procedures (Signed)
   NAME:  [name], [name]                        ACCOUNT NO.:  [id number]   MEDICAL RECORD NO.:  [id number]                   PATIENT TYPE:  OUT   LOCATION:  RAD                                  FACILITY:  [facility]   PHYSICIAN:  [place] Bing, M.D.               DATE OF BIRTH:  01/17/1956   DATE OF PROCEDURE:  07/21/2002                  AGE:  55  DATE OF DISCHARGE:  07/21/2002                  SEX:  F                                CARDIAC ULTRASOUND   CLINICAL INFORMATION:  A history of hypertension, chest pain and dyspnea.   M-MODE:  AORTA:  2.8  (<4.0)  LEFT ATRIUM:  3.7  (<4.0)  SEPTUM:  1.3  (0.7-1.1)  POSTERIOR WALL:  1.2  (0.7-1.1)  LV-DIASTOLE:  3.8  (<5.7)  LV-SYSTOLE:  2.7  (<4.0)   CLINICAL INFORMATION:  @@   PREVIOUS STUDY:  @@   M-MODE:  AORTA:  @@  (<4.0)  LEFT ATRIUM:  @@  (<4.0)  SEPTUM:  @@  (0.7-1.1)  POSTERIOR WALL:  @@  (0.7-1.1)  LV-DIASTOLE:  @@  (<5.7)  LV-SYSTOLE:  @@  (<4.0)   FINDINGS/IMPRESSION:  1. A technically somewhat suboptimal, but adequate echocardiographic study.  2. Normal left atrium, right atrium and right ventricle.  3. Normal aortic, mitral, and tricuspid valves.  4. Normal internal dimension of the left ventricle;  5. Borderline LVH.  6. Normal regional and global LV systolic function.  7. Normal IVC.                                               Ruth Bing, M.D.    RR/MEDQ  D:  07/23/2002  T:  07/24/2002  Job:  [id number]

## 2010-06-20 NOTE — Op Note (Signed)
[place]. [facility]  Patient:    [name], [name] Visit Number: [id number] MRN: [id number]          Service Type: SUR Location: [id number] Attending Physician:  Emeterio Reeve Dictated by:   Payton Doughty, M.D. Proc. Date: 10/26/00 Admit Date:  10/26/2000                             Operative Report  PREOPERATIVE DIAGNOSIS:   Probable wound infection.  POSTOPERATIVE DIAGNOSIS:  Wound infection.  OPERATION:  Incision and drainage of lumbar wound infection.  SURGEON:  Payton Doughty, M.D.  INDICATIONS:  This is a 55 year old girl who three weeks ago had a lumbar fusion done.  Initially she did well.  She has had increasing pain.  She had some fever and drainage from her incisions and presents for I&D of her wound.  DESCRIPTION OF PROCEDURE:  She was taken to the operating room, smoothly anesthestized and intubated and placed prone on the operating room table. Following prep and drape in the usual sterile fashion, the incision was opened being productive of some frank pus.  This was cultured and sent off.  All surgical areas were reopened down to the level of the lamina and there was pus down to the lamina.  They were all carefully irrigated and then scrubbed with a Betadine scrubby.  Bacitracin irrigation was then done.  The entire thing was then packed with Betadine soaked Kerlix.  It was covered with the Montgomery strap.  The patient returned to the recovery room in good condition. Dictated by:   Payton Doughty, M.D. Attending Physician:  Emeterio Reeve DD:  10/26/00 TD:  10/26/00 Job: [phone]

## 2010-08-04 ENCOUNTER — Ambulatory Visit (HOSPITAL_COMMUNITY): Payer: Medicare Other

## 2010-08-12 ENCOUNTER — Ambulatory Visit (HOSPITAL_COMMUNITY): Payer: Medicare Other

## 2010-08-12 ENCOUNTER — Ambulatory Visit (HOSPITAL_COMMUNITY): Admission: RE | Admit: 2010-08-12 | Payer: Medicare Other | Source: Ambulatory Visit

## 2010-08-20 ENCOUNTER — Ambulatory Visit (HOSPITAL_COMMUNITY)
Admission: RE | Admit: 2010-08-20 | Discharge: 2010-08-20 | Disposition: A | Discharge status: Home / Self Care | Payer: Medicare Other | Source: Ambulatory Visit | Attending: Pulmonary Disease | Admitting: Pulmonary Disease

## 2010-08-20 ENCOUNTER — Encounter (HOSPITAL_COMMUNITY): Payer: Self-pay

## 2010-08-20 ENCOUNTER — Emergency Department (HOSPITAL_COMMUNITY)
Admission: EM | Admit: 2010-08-20 | Discharge: 2010-08-20 | Disposition: A | Discharge status: Home / Self Care | Payer: Medicare Other | Attending: Emergency Medicine | Admitting: Emergency Medicine

## 2010-08-20 ENCOUNTER — Other Ambulatory Visit (HOSPITAL_COMMUNITY): Payer: Self-pay | Admitting: Pulmonary Disease

## 2010-08-20 DIAGNOSIS — R0602 Shortness of breath: Secondary | ICD-10-CM

## 2010-08-20 DIAGNOSIS — R079 Chest pain, unspecified: Secondary | ICD-10-CM | POA: Insufficient documentation

## 2010-08-20 DIAGNOSIS — R209 Unspecified disturbances of skin sensation: Secondary | ICD-10-CM | POA: Insufficient documentation

## 2010-08-20 DIAGNOSIS — Z79899 Other long term (current) drug therapy: Secondary | ICD-10-CM | POA: Insufficient documentation

## 2010-08-20 DIAGNOSIS — Z9861 Coronary angioplasty status: Secondary | ICD-10-CM | POA: Insufficient documentation

## 2010-08-20 DIAGNOSIS — R05 Cough: Secondary | ICD-10-CM | POA: Insufficient documentation

## 2010-08-20 DIAGNOSIS — R062 Wheezing: Secondary | ICD-10-CM | POA: Insufficient documentation

## 2010-08-20 DIAGNOSIS — E119 Type 2 diabetes mellitus without complications: Secondary | ICD-10-CM | POA: Insufficient documentation

## 2010-08-20 DIAGNOSIS — I2510 Atherosclerotic heart disease of native coronary artery without angina pectoris: Secondary | ICD-10-CM | POA: Insufficient documentation

## 2010-08-20 DIAGNOSIS — R064 Hyperventilation: Secondary | ICD-10-CM

## 2010-08-20 DIAGNOSIS — I10 Essential (primary) hypertension: Secondary | ICD-10-CM | POA: Insufficient documentation

## 2010-08-20 DIAGNOSIS — R200 Anesthesia of skin: Secondary | ICD-10-CM

## 2010-08-20 DIAGNOSIS — R059 Cough, unspecified: Secondary | ICD-10-CM | POA: Insufficient documentation

## 2010-08-20 HISTORY — DX: Disorder of thyroid, unspecified: E07.9

## 2010-08-20 HISTORY — DX: Atherosclerotic heart disease of native coronary artery without angina pectoris: I25.10

## 2010-08-20 HISTORY — DX: Essential (primary) hypertension: I10

## 2010-08-20 LAB — GLUCOSE, CAPILLARY: Glucose-Capillary: 156 mg/dL — ABNORMAL HIGH (ref 70–99)

## 2010-08-20 LAB — COMPREHENSIVE METABOLIC PANEL
ALT: 45 U/L — ABNORMAL HIGH (ref 0–35)
AST: 81 U/L — ABNORMAL HIGH (ref 0–37)
Albumin: 3.4 g/dL — ABNORMAL LOW (ref 3.5–5.2)
Alkaline Phosphatase: 126 U/L — ABNORMAL HIGH (ref 39–117)
BUN: 13 mg/dL (ref 6–23)
CO2: 28 mEq/L (ref 19–32)
Calcium: 9.2 mg/dL (ref 8.4–10.5)
Chloride: 100 mEq/L (ref 96–112)
Creatinine, Ser: 0.83 mg/dL (ref 0.50–1.10)
GFR calc Af Amer: >60 mL/min (ref >60)
GFR calc non Af Amer: >60 mL/min (ref >60)
Glucose, Bld: 180 mg/dL — ABNORMAL HIGH (ref 70–99)
Potassium: 5 mEq/L (ref 3.5–5.1)
Sodium: 136 mEq/L (ref 135–145)
Total Bilirubin: 0.4 mg/dL (ref 0.3–1.2)
Total Protein: 6.8 g/dL (ref 6.0–8.3)

## 2010-08-20 LAB — BLOOD GAS, ARTERIAL
Acid-Base Excess: 0.3 mmol/L (ref 0.0–2.0)
Bicarbonate: 25.2 mEq/L — ABNORMAL HIGH (ref 20.0–24.0)
Drawn by: 21179
FIO2: 0.21 %
O2 Saturation: 95.2 %
Patient temperature: 37
TCO2: 23 mmol/L (ref 0–100)
pCO2 arterial: 46.6 mmHg — ABNORMAL HIGH (ref 35.0–45.0)
pH, Arterial: 7.351 (ref 7.350–7.400)
pO2, Arterial: 78.9 mmHg — ABNORMAL LOW (ref 80.0–100.0)

## 2010-08-20 LAB — CBC
HCT: 34.6 % — ABNORMAL LOW (ref 36.0–46.0)
Hemoglobin: 11.4 g/dL — ABNORMAL LOW (ref 12.0–15.0)
MCH: 30.1 pg (ref 26.0–34.0)
MCHC: 32.9 g/dL (ref 30.0–36.0)
MCV: 91.3 fL (ref 78.0–100.0)
Platelets: 201 10*3/uL (ref 150–400)
RBC: 3.79 MIL/uL — ABNORMAL LOW (ref 3.87–5.11)
RDW: 13.6 % (ref 11.5–15.5)
WBC: 6.5 10*3/uL (ref 4.0–10.5)

## 2010-08-20 LAB — DIFFERENTIAL
Basophils Absolute: 0 10*3/uL (ref 0.0–0.1)
Basophils Relative: 0 % (ref 0–1)
Eosinophils Absolute: 0.2 10*3/uL (ref 0.0–0.7)
Eosinophils Relative: 4 % (ref 0–5)
Lymphocytes Relative: 39 % (ref 12–46)
Lymphs Abs: 2.6 10*3/uL (ref 0.7–4.0)
Monocytes Absolute: 0.4 10*3/uL (ref 0.1–1.0)
Monocytes Relative: 7 % (ref 3–12)
Neutro Abs: 3.3 10*3/uL (ref 1.7–7.7)
Neutrophils Relative %: 50 % (ref 43–77)

## 2010-08-20 LAB — TROPONIN I: Troponin I: <0.3 ng/mL (ref <0.30)

## 2010-08-20 LAB — CK TOTAL AND CKMB (NOT AT ARMC)
CK, MB: 2.3 ng/mL (ref 0.3–4.0)
Relative Index: 0.5 (ref 0.0–2.5)
Total CK: 466 U/L — ABNORMAL HIGH (ref 7–177)

## 2010-08-20 MED ORDER — ALBUTEROL SULFATE (2.5 MG/3ML) 0.083% IN NEBU: 2.5000 mg | INHALATION_SOLUTION | Freq: Once | RESPIRATORY_TRACT | Status: DC

## 2010-08-20 NOTE — ED Provider Notes (Signed)
History     Chief Complaint  Patient presents with  . Numbness  . Chest Pain  . Shortness of Breath   Patient is a 55 y.o. female presenting with chest pain. The history is provided by the patient. No language interpreter was used.  Chest Pain Episode onset: today. Chest pain occurs constantly. The chest pain is resolved. The pain is associated with breathing. The severity of the pain is mild. The quality of the pain is described as tightness. The pain does not radiate. Exacerbated by: nothing. Pertinent negatives for primary symptoms include no fatigue, no shortness of breath, no cough, no abdominal pain, no nausea and no vomiting.  Associated symptoms include numbness.  Pertinent negatives for associated symptoms include no diaphoresis and no near-syncope. Treatments tried: rest. Risk factors include obesity.  Her past medical history is significant for CAD, diabetes, hypertension and thyroid problem.  Pertinent negatives for past medical history include no seizures.   Patient reports she was having a pulmonary function test performed this morning and when the pulmonary technician left briefly she had a sudden onset of chest tightness and facial numbness. Patient attributes chest tightness and facial numbness to "breathing to hard" during the pulmonary function test. States she currently feels normal and al symptoms have resolved. Denies abdominal pain, diaphoresis, back pain, SOB. Past Medical History  Diagnosis Date  . Diabetes mellitus   . Hypertension   . Asthma   . Thyroid disease   . Coronary artery disease     Past Surgical History  Procedure Date  . Coronary angioplasty with stent placement   . Cholecystectomy   . Abdominal hysterectomy   . Elbow surgery   . Back surgery   . Knee surgery     No family history on file.  History  Substance Use Topics  . Smoking status: Never Smoker   . Smokeless tobacco: Not on file  . Alcohol Use: No    OB History    Grav Para  Term Preterm Abortions TAB SAB Ect Mult Living                  Review of Systems  Constitutional: Negative for diaphoresis and fatigue.  HENT: Negative for congestion, sinus pressure and ear discharge.   Eyes: Negative for discharge.  Respiratory: Negative for cough and shortness of breath.   Cardiovascular: Positive for chest pain. Negative for near-syncope.  Gastrointestinal: Negative for nausea, vomiting, abdominal pain and diarrhea.  Genitourinary: Negative for frequency and hematuria.  Musculoskeletal: Negative for back pain.  Skin: Negative for rash.  Neurological: Positive for numbness. Negative for seizures and headaches.  Hematological: Negative.   Psychiatric/Behavioral: Negative for hallucinations.    Physical Exam  BP 107/63  Pulse 67  Temp(Src) 97.8 F (36.6 C) (Oral)  Resp 16  Ht 5\' 3"  (1.6 m)  Wt 237 lb (107.502 kg)  BMI 41.98 kg/m2  SpO2 97%  Physical Exam  Nursing note and vitals reviewed. Constitutional: She is oriented to person, place, and time. Vital signs are normal. She appears well-developed and well-nourished. No distress.  HENT:  Head: Normocephalic and atraumatic.  Eyes: Conjunctivae and EOM are normal. No scleral icterus.  Neck: Normal range of motion. Neck supple.  Cardiovascular: Normal rate and regular rhythm.  Exam reveals no gallop and no friction rub.   No murmur heard. Pulmonary/Chest: Effort normal. No stridor. She has no wheezes. She has no rales. She exhibits no tenderness.  Abdominal: She exhibits no distension. There is no  tenderness. There is no rebound.  Musculoskeletal: Normal range of motion. She exhibits no edema.  Neurological: She is oriented to person, place, and time.  Skin: No rash noted. No erythema.  Psychiatric: She has a normal mood and affect. Her behavior is normal.    ED Course  Procedures  MDM Pt improved with time  Results for orders placed during the hospital encounter of 08/20/10  CBC      Component  Value Range   WBC 6.5  4.0 - 10.5 (K/uL)   RBC 3.79 (*) 3.87 - 5.11 (MIL/uL)   Hemoglobin 11.4 (*) 12.0 - 15.0 (g/dL)   HCT 91.4 (*) 78.2 - 46.0 (%)   MCV 91.3  78.0 - 100.0 (fL)   MCH 30.1  26.0 - 34.0 (pg)   MCHC 32.9  30.0 - 36.0 (g/dL)   RDW 95.6  21.3 - 08.6 (%)   Platelets 201  150 - 400 (K/uL)  DIFFERENTIAL      Component Value Range   Neutrophils Relative 50  43 - 77 (%)   Neutro Abs 3.3  1.7 - 7.7 (K/uL)   Lymphocytes Relative 39  12 - 46 (%)   Lymphs Abs 2.6  0.7 - 4.0 (K/uL)   Monocytes Relative 7  3 - 12 (%)   Monocytes Absolute 0.4  0.1 - 1.0 (K/uL)   Eosinophils Relative 4  0 - 5 (%)   Eosinophils Absolute 0.2  0.0 - 0.7 (K/uL)   Basophils Relative 0  0 - 1 (%)   Basophils Absolute 0.0  0.0 - 0.1 (K/uL)  COMPREHENSIVE METABOLIC PANEL      Component Value Range   Sodium 136  135 - 145 (mEq/L)   Potassium 5.0  3.5 - 5.1 (mEq/L)   Chloride 100  96 - 112 (mEq/L)   CO2 28  19 - 32 (mEq/L)   Glucose, Bld 180 (*) 70 - 99 (mg/dL)   BUN 13  6 - 23 (mg/dL)   Creatinine, Ser 5.78  0.50 - 1.10 (mg/dL)   Calcium 9.2  8.4 - 46.9 (mg/dL)   Total Protein 6.8  6.0 - 8.3 (g/dL)   Albumin 3.4 (*) 3.5 - 5.2 (g/dL)   AST 81 (*) 0 - 37 (U/L)   ALT 45 (*) 0 - 35 (U/L)   Alkaline Phosphatase 126 (*) 39 - 117 (U/L)   Total Bilirubin 0.4  0.3 - 1.2 (mg/dL)   GFR calc non Af Amer >60  >60 (mL/min)   GFR calc Af Amer >60  >60 (mL/min)  TROPONIN I      Component Value Range   Troponin I <0.30  <0.30 (ng/mL)  CK TOTAL AND CKMB      Component Value Range   Total CK 466 (*) 7 - 177 (U/L)   CK, MB 2.3  0.3 - 4.0 (ng/mL)   Relative Index 0.5  0.0 - 2.5   GLUCOSE, CAPILLARY      Component Value Range   Glucose-Capillary 156 (*) 70 - 99 (mg/dL)   Comment 1 Documented in Chart     Comment 2 Notify RN        Chart written by Clarita Crane acting as scribe for Dr. Estell Harpin  I personally performed the services described in this documentation, which was scribed in my presence. The  recorded information has been reviewed and considered.   Benny Lennert, MD 08/20/10 [id number]

## 2010-08-20 NOTE — ED Notes (Signed)
Pt doing an out pt pulmonary functioning test to r/o copd. During test pt states her face started to feel numb and began to have chest tightness with sob as well. Pt states face is still numb at present. Face symmetrical, grips strong and speech intact.  Speaking complete sentences and breathing normally. EKG done x 2 in Resp outpt prior to being transferred to ER>

## 2010-08-20 NOTE — ED Notes (Signed)
Dr. Estell Harpin made aware of pt in ER.

## 2010-08-21 NOTE — Procedures (Signed)
NAMECEDRIC, [name]              ACCOUNT NO.:  [id number]  MEDICAL RECORD NO.:  [id number]  LOCATION:                                 FACILITY:  PHYSICIAN:  Edward L. Juanetta Gosling, M.D.DATE OF BIRTH:  [birth year]  DATE OF PROCEDURE: DATE OF DISCHARGE:                           PULMONARY FUNCTION TEST   REASON FOR PULMONARY FUNCTION TESTING:  Shortness of breath. 1. Spirometry shows a mild ventilatory defect with evidence of airflow     obstruction. 2. Lung volumes show mild reduction in total lung capacity suggesting     restrictive lung disease. 3. DLCO is moderately reduced and someone corrected for volume. 4. Arterial blood gas is normal. 5. Noting the patient's body habitus, this is consistent with a     combined obstructive and restrictive change perhaps partially due     to body habitus with height and weight.     Edward L. Juanetta Gosling, M.D.     ELH/MEDQ  D:  08/21/2010  T:  08/21/2010  Job:  [id number]

## 2010-11-13 ENCOUNTER — Other Ambulatory Visit (HOSPITAL_COMMUNITY): Payer: Self-pay | Admitting: Pulmonary Disease

## 2010-11-13 ENCOUNTER — Ambulatory Visit (HOSPITAL_COMMUNITY)
Admission: RE | Admit: 2010-11-13 | Discharge: 2010-11-13 | Disposition: A | Discharge status: Home / Self Care | Payer: Medicare Other | Source: Ambulatory Visit | Attending: Pulmonary Disease | Admitting: Pulmonary Disease

## 2010-11-13 DIAGNOSIS — R062 Wheezing: Secondary | ICD-10-CM

## 2010-11-13 DIAGNOSIS — IMO0001 Reserved for inherently not codable concepts without codable children: Secondary | ICD-10-CM

## 2010-11-13 DIAGNOSIS — R059 Cough, unspecified: Secondary | ICD-10-CM

## 2010-11-13 DIAGNOSIS — R05 Cough: Secondary | ICD-10-CM

## 2010-11-13 LAB — POCT I-STAT GLUCOSE
Glucose, Bld: 116 — ABNORMAL HIGH
Operator id: 141321

## 2011-03-10 ENCOUNTER — Other Ambulatory Visit (HOSPITAL_COMMUNITY): Payer: Self-pay | Admitting: Pulmonary Disease

## 2011-03-10 ENCOUNTER — Ambulatory Visit (HOSPITAL_COMMUNITY)
Admission: RE | Admit: 2011-03-10 | Discharge: 2011-03-10 | Disposition: A | Discharge status: Home / Self Care | Payer: Medicare Other | Source: Ambulatory Visit | Attending: Pulmonary Disease | Admitting: Pulmonary Disease

## 2011-03-10 DIAGNOSIS — R05 Cough: Secondary | ICD-10-CM | POA: Diagnosis not present

## 2011-03-10 DIAGNOSIS — R059 Cough, unspecified: Secondary | ICD-10-CM | POA: Diagnosis not present

## 2011-03-25 DIAGNOSIS — R0609 Other forms of dyspnea: Secondary | ICD-10-CM | POA: Diagnosis not present

## 2011-03-25 DIAGNOSIS — E119 Type 2 diabetes mellitus without complications: Secondary | ICD-10-CM | POA: Diagnosis not present

## 2011-03-25 DIAGNOSIS — I2510 Atherosclerotic heart disease of native coronary artery without angina pectoris: Secondary | ICD-10-CM | POA: Diagnosis not present

## 2011-03-25 DIAGNOSIS — R0989 Other specified symptoms and signs involving the circulatory and respiratory systems: Secondary | ICD-10-CM | POA: Diagnosis not present

## 2011-03-25 DIAGNOSIS — E782 Mixed hyperlipidemia: Secondary | ICD-10-CM | POA: Diagnosis not present

## 2011-03-25 DIAGNOSIS — I10 Essential (primary) hypertension: Secondary | ICD-10-CM | POA: Diagnosis not present

## 2011-03-27 DIAGNOSIS — R0609 Other forms of dyspnea: Secondary | ICD-10-CM | POA: Diagnosis not present

## 2011-03-27 DIAGNOSIS — I2510 Atherosclerotic heart disease of native coronary artery without angina pectoris: Secondary | ICD-10-CM | POA: Diagnosis not present

## 2011-03-27 DIAGNOSIS — R0989 Other specified symptoms and signs involving the circulatory and respiratory systems: Secondary | ICD-10-CM | POA: Diagnosis not present

## 2011-03-27 DIAGNOSIS — I10 Essential (primary) hypertension: Secondary | ICD-10-CM | POA: Diagnosis not present

## 2011-03-27 DIAGNOSIS — E119 Type 2 diabetes mellitus without complications: Secondary | ICD-10-CM | POA: Diagnosis not present

## 2011-03-27 DIAGNOSIS — E782 Mixed hyperlipidemia: Secondary | ICD-10-CM | POA: Diagnosis not present

## 2011-04-13 DIAGNOSIS — D485 Neoplasm of uncertain behavior of skin: Secondary | ICD-10-CM | POA: Diagnosis not present

## 2011-04-13 DIAGNOSIS — L538 Other specified erythematous conditions: Secondary | ICD-10-CM | POA: Diagnosis not present

## 2011-04-13 DIAGNOSIS — L820 Inflamed seborrheic keratosis: Secondary | ICD-10-CM | POA: Diagnosis not present

## 2011-04-13 DIAGNOSIS — D1801 Hemangioma of skin and subcutaneous tissue: Secondary | ICD-10-CM | POA: Diagnosis not present

## 2011-04-13 DIAGNOSIS — L570 Actinic keratosis: Secondary | ICD-10-CM | POA: Diagnosis not present

## 2011-04-14 DIAGNOSIS — I10 Essential (primary) hypertension: Secondary | ICD-10-CM | POA: Diagnosis not present

## 2011-04-14 DIAGNOSIS — E781 Pure hyperglyceridemia: Secondary | ICD-10-CM | POA: Diagnosis not present

## 2011-04-14 DIAGNOSIS — IMO0001 Reserved for inherently not codable concepts without codable children: Secondary | ICD-10-CM | POA: Diagnosis not present

## 2011-04-14 DIAGNOSIS — E039 Hypothyroidism, unspecified: Secondary | ICD-10-CM | POA: Diagnosis not present

## 2011-05-28 DIAGNOSIS — Z79899 Other long term (current) drug therapy: Secondary | ICD-10-CM | POA: Diagnosis not present

## 2011-05-28 DIAGNOSIS — Z794 Long term (current) use of insulin: Secondary | ICD-10-CM | POA: Diagnosis not present

## 2011-05-28 DIAGNOSIS — E119 Type 2 diabetes mellitus without complications: Secondary | ICD-10-CM | POA: Diagnosis not present

## 2011-05-28 DIAGNOSIS — I10 Essential (primary) hypertension: Secondary | ICD-10-CM | POA: Diagnosis not present

## 2011-05-28 DIAGNOSIS — M79609 Pain in unspecified limb: Secondary | ICD-10-CM | POA: Diagnosis not present

## 2011-05-28 DIAGNOSIS — R21 Rash and other nonspecific skin eruption: Secondary | ICD-10-CM | POA: Diagnosis not present

## 2011-05-28 DIAGNOSIS — Z7902 Long term (current) use of antithrombotics/antiplatelets: Secondary | ICD-10-CM | POA: Diagnosis not present

## 2011-06-03 DIAGNOSIS — I2510 Atherosclerotic heart disease of native coronary artery without angina pectoris: Secondary | ICD-10-CM | POA: Diagnosis not present

## 2011-06-03 DIAGNOSIS — J984 Other disorders of lung: Secondary | ICD-10-CM | POA: Diagnosis not present

## 2011-06-03 DIAGNOSIS — J449 Chronic obstructive pulmonary disease, unspecified: Secondary | ICD-10-CM | POA: Diagnosis not present

## 2011-06-03 DIAGNOSIS — I872 Venous insufficiency (chronic) (peripheral): Secondary | ICD-10-CM | POA: Diagnosis not present

## 2011-06-03 DIAGNOSIS — Z8249 Family history of ischemic heart disease and other diseases of the circulatory system: Secondary | ICD-10-CM | POA: Diagnosis not present

## 2011-06-03 DIAGNOSIS — Z23 Encounter for immunization: Secondary | ICD-10-CM | POA: Diagnosis not present

## 2011-06-03 DIAGNOSIS — R42 Dizziness and giddiness: Secondary | ICD-10-CM | POA: Diagnosis not present

## 2011-06-03 DIAGNOSIS — Z7902 Long term (current) use of antithrombotics/antiplatelets: Secondary | ICD-10-CM | POA: Diagnosis not present

## 2011-06-03 DIAGNOSIS — I10 Essential (primary) hypertension: Secondary | ICD-10-CM | POA: Diagnosis not present

## 2011-06-03 DIAGNOSIS — Z794 Long term (current) use of insulin: Secondary | ICD-10-CM | POA: Diagnosis not present

## 2011-06-03 DIAGNOSIS — Z9861 Coronary angioplasty status: Secondary | ICD-10-CM | POA: Diagnosis not present

## 2011-06-03 DIAGNOSIS — Z79899 Other long term (current) drug therapy: Secondary | ICD-10-CM | POA: Diagnosis not present

## 2011-06-03 DIAGNOSIS — E860 Dehydration: Secondary | ICD-10-CM | POA: Diagnosis not present

## 2011-06-03 DIAGNOSIS — Z6839 Body mass index (BMI) 39.0-39.9, adult: Secondary | ICD-10-CM | POA: Diagnosis not present

## 2011-06-03 DIAGNOSIS — R110 Nausea: Secondary | ICD-10-CM | POA: Diagnosis not present

## 2011-06-03 DIAGNOSIS — R0789 Other chest pain: Secondary | ICD-10-CM | POA: Diagnosis not present

## 2011-06-03 DIAGNOSIS — Z8701 Personal history of pneumonia (recurrent): Secondary | ICD-10-CM | POA: Diagnosis not present

## 2011-06-03 DIAGNOSIS — E119 Type 2 diabetes mellitus without complications: Secondary | ICD-10-CM | POA: Diagnosis not present

## 2011-06-03 DIAGNOSIS — R079 Chest pain, unspecified: Secondary | ICD-10-CM | POA: Diagnosis not present

## 2011-06-03 DIAGNOSIS — E669 Obesity, unspecified: Secondary | ICD-10-CM | POA: Diagnosis not present

## 2011-06-03 DIAGNOSIS — D485 Neoplasm of uncertain behavior of skin: Secondary | ICD-10-CM | POA: Diagnosis not present

## 2011-06-03 DIAGNOSIS — E039 Hypothyroidism, unspecified: Secondary | ICD-10-CM | POA: Diagnosis not present

## 2011-06-03 DIAGNOSIS — R51 Headache: Secondary | ICD-10-CM | POA: Diagnosis not present

## 2011-06-04 DIAGNOSIS — J189 Pneumonia, unspecified organism: Secondary | ICD-10-CM | POA: Diagnosis not present

## 2011-06-04 DIAGNOSIS — R079 Chest pain, unspecified: Secondary | ICD-10-CM | POA: Diagnosis not present

## 2011-06-05 DIAGNOSIS — R51 Headache: Secondary | ICD-10-CM | POA: Diagnosis not present

## 2011-06-05 DIAGNOSIS — J189 Pneumonia, unspecified organism: Secondary | ICD-10-CM | POA: Diagnosis not present

## 2011-06-05 DIAGNOSIS — Z9889 Other specified postprocedural states: Secondary | ICD-10-CM | POA: Diagnosis not present

## 2011-06-05 DIAGNOSIS — R079 Chest pain, unspecified: Secondary | ICD-10-CM | POA: Diagnosis not present

## 2011-09-16 DIAGNOSIS — I10 Essential (primary) hypertension: Secondary | ICD-10-CM | POA: Diagnosis not present

## 2011-09-16 DIAGNOSIS — E039 Hypothyroidism, unspecified: Secondary | ICD-10-CM | POA: Diagnosis not present

## 2011-09-16 DIAGNOSIS — E781 Pure hyperglyceridemia: Secondary | ICD-10-CM | POA: Diagnosis not present

## 2011-09-16 DIAGNOSIS — IMO0001 Reserved for inherently not codable concepts without codable children: Secondary | ICD-10-CM | POA: Diagnosis not present

## 2011-11-02 DIAGNOSIS — M5020 Other cervical disc displacement, unspecified cervical region: Secondary | ICD-10-CM | POA: Diagnosis not present

## 2011-11-02 DIAGNOSIS — Z23 Encounter for immunization: Secondary | ICD-10-CM | POA: Diagnosis not present

## 2011-11-10 DIAGNOSIS — M542 Cervicalgia: Secondary | ICD-10-CM | POA: Diagnosis not present

## 2011-11-10 DIAGNOSIS — M47812 Spondylosis without myelopathy or radiculopathy, cervical region: Secondary | ICD-10-CM | POA: Diagnosis not present

## 2011-11-10 DIAGNOSIS — M5020 Other cervical disc displacement, unspecified cervical region: Secondary | ICD-10-CM | POA: Diagnosis not present

## 2011-11-23 DIAGNOSIS — M5020 Other cervical disc displacement, unspecified cervical region: Secondary | ICD-10-CM | POA: Diagnosis not present

## 2011-11-25 ENCOUNTER — Other Ambulatory Visit (HOSPITAL_COMMUNITY): Payer: Self-pay | Admitting: Pulmonary Disease

## 2011-11-25 DIAGNOSIS — Z139 Encounter for screening, unspecified: Secondary | ICD-10-CM

## 2011-12-01 ENCOUNTER — Ambulatory Visit (HOSPITAL_COMMUNITY): Payer: Medicare Other

## 2012-01-18 DIAGNOSIS — IMO0001 Reserved for inherently not codable concepts without codable children: Secondary | ICD-10-CM | POA: Diagnosis not present

## 2012-01-18 DIAGNOSIS — E039 Hypothyroidism, unspecified: Secondary | ICD-10-CM | POA: Diagnosis not present

## 2012-01-18 DIAGNOSIS — E781 Pure hyperglyceridemia: Secondary | ICD-10-CM | POA: Diagnosis not present

## 2012-01-18 DIAGNOSIS — I10 Essential (primary) hypertension: Secondary | ICD-10-CM | POA: Diagnosis not present

## 2012-01-19 DIAGNOSIS — L538 Other specified erythematous conditions: Secondary | ICD-10-CM | POA: Diagnosis not present

## 2012-02-01 DIAGNOSIS — M5020 Other cervical disc displacement, unspecified cervical region: Secondary | ICD-10-CM | POA: Diagnosis not present

## 2012-02-16 DIAGNOSIS — N959 Unspecified menopausal and perimenopausal disorder: Secondary | ICD-10-CM | POA: Diagnosis not present

## 2012-02-16 DIAGNOSIS — IMO0001 Reserved for inherently not codable concepts without codable children: Secondary | ICD-10-CM | POA: Diagnosis not present

## 2012-02-16 DIAGNOSIS — E781 Pure hyperglyceridemia: Secondary | ICD-10-CM | POA: Diagnosis not present

## 2012-02-16 DIAGNOSIS — G609 Hereditary and idiopathic neuropathy, unspecified: Secondary | ICD-10-CM | POA: Diagnosis not present

## 2012-03-15 DIAGNOSIS — M47817 Spondylosis without myelopathy or radiculopathy, lumbosacral region: Secondary | ICD-10-CM | POA: Diagnosis not present

## 2012-04-26 DIAGNOSIS — M5020 Other cervical disc displacement, unspecified cervical region: Secondary | ICD-10-CM | POA: Diagnosis not present

## 2012-06-02 DIAGNOSIS — E782 Mixed hyperlipidemia: Secondary | ICD-10-CM | POA: Diagnosis not present

## 2012-06-02 DIAGNOSIS — I10 Essential (primary) hypertension: Secondary | ICD-10-CM | POA: Diagnosis not present

## 2012-06-02 DIAGNOSIS — I2510 Atherosclerotic heart disease of native coronary artery without angina pectoris: Secondary | ICD-10-CM | POA: Diagnosis not present

## 2012-06-02 DIAGNOSIS — I509 Heart failure, unspecified: Secondary | ICD-10-CM | POA: Diagnosis not present

## 2012-06-02 DIAGNOSIS — R072 Precordial pain: Secondary | ICD-10-CM | POA: Diagnosis not present

## 2012-06-02 DIAGNOSIS — Z01810 Encounter for preprocedural cardiovascular examination: Secondary | ICD-10-CM | POA: Diagnosis not present

## 2012-06-08 DIAGNOSIS — I10 Essential (primary) hypertension: Secondary | ICD-10-CM | POA: Diagnosis not present

## 2012-06-08 DIAGNOSIS — I509 Heart failure, unspecified: Secondary | ICD-10-CM | POA: Diagnosis not present

## 2012-06-08 DIAGNOSIS — R072 Precordial pain: Secondary | ICD-10-CM | POA: Diagnosis not present

## 2012-06-08 DIAGNOSIS — I2510 Atherosclerotic heart disease of native coronary artery without angina pectoris: Secondary | ICD-10-CM | POA: Diagnosis not present

## 2012-06-08 DIAGNOSIS — E782 Mixed hyperlipidemia: Secondary | ICD-10-CM | POA: Diagnosis not present

## 2012-06-08 DIAGNOSIS — Z01810 Encounter for preprocedural cardiovascular examination: Secondary | ICD-10-CM | POA: Diagnosis not present

## 2012-06-17 DIAGNOSIS — R072 Precordial pain: Secondary | ICD-10-CM | POA: Diagnosis not present

## 2012-06-17 DIAGNOSIS — I509 Heart failure, unspecified: Secondary | ICD-10-CM | POA: Diagnosis not present

## 2012-06-17 DIAGNOSIS — R9439 Abnormal result of other cardiovascular function study: Secondary | ICD-10-CM | POA: Diagnosis not present

## 2012-06-17 DIAGNOSIS — I10 Essential (primary) hypertension: Secondary | ICD-10-CM | POA: Diagnosis not present

## 2012-06-17 DIAGNOSIS — I2510 Atherosclerotic heart disease of native coronary artery without angina pectoris: Secondary | ICD-10-CM | POA: Diagnosis not present

## 2012-06-20 ENCOUNTER — Other Ambulatory Visit: Payer: Self-pay | Admitting: Interventional Cardiology

## 2012-06-21 ENCOUNTER — Other Ambulatory Visit: Payer: Self-pay | Admitting: Cardiology

## 2012-06-21 NOTE — H&P (Signed)
PRE CATH WORK UP/HS/786.50/794.30/LABS/CHEST XRAY/SEE LINDA.      HPI:     General:           [name] is a 57 yo female followed by Dr Katrinka Blazing with a hx of CAD with multiple LAD stents in 2005, Hypertension, Diabetes and CHF. She recently saw Dr Katrinka Blazing for preop Cervical disc surgerical clearance, at which time she described periods of chest pains and he proceeded with nuclear stress test with noted apical defect.          She states she has left sided chest cramps, "twinges" that radiate into left arm, occurs with activities such as showering or carrying groceries. She has used NTG last week and it helps. She has not had any pain this week where she felt she needed NTG..         Patient denies syncope, swelling, nor PND. occasionally awakens at night due to palpitations especially if lying on back. Occasional dizziness, She has hx of asthma that she feels is stable, but does have SOB with exertion, occasional cough, occasional wheezing and her inhalers help. no URI, no fever,.     ROS:      as noted in HPI, no allergies to the IVP dye no neurological complaints, no signs of bleeding, no nausea, vomiting nor GI complaints. she does develop late eveing swelling in ankles. she feels she can lay flat for cardiac cath.     Medical History: Coronary atherosclerotic heart disease with multiple LAD stents ([place]), 2005, Asthma, History of panic attacks, Hypertension, Degenerative disc disease, Gastroesophageal reflux disease.      Surgical History: Hysterectomy 1982, Cholecystectomy 1990, Knee surgery x2, Dr. [facility] , Rectal fissure surgery .      Family History:  Father: deceased 78 yrs, Myocardial infarctionMother: alive 78 yrs, Myocardial infarctionSister 1: alive 18 yrs     Social History:      General: no Smoking. no Tobacco Exposure. no Alcohol. Marital Status: single.      Medications: Taking Crestor 40 mg Tablet 1 tablet once a day---followed by Dr Rulon Sera, Taking  Levothyroxine Sodium 175 MCG Tablet 1 tablet every morning on an empty stomach Once a day, Taking Diazepam 5 MG Tablet 1 tablet Once a day, Taking Hydrocodone-APAP-Dietary Prod 10-325 MG Miscellaneous as directed , Taking Omeprazole 20 MG Capsule Delayed Release 1 capsule twice a day, Taking Mirtazapine 30 mg at bedtime, Taking Symbicort 160-4.5 MCG/ACT Aerosol 2 puffs Twice a day, Taking ProAir HFA 108 (90 Base) MCG/ACT Aerosol Solution 2 puffs as needed every 4 hrs, Taking Spiriva HandiHaler 18 MCG Capsule 1 capsule by mouth Once a day, Taking Losartan Potassium 100 mg 1 tablet once a day, Taking Lasix 40 MG Tablet 1 tablet twice a day, Taking Potassium Chloride Crys CR 20 MEQ Tablet Extended Release 1 tablet Twice a day, Taking Metformin XR 500 MG Tablet Extended Release 24 Hour 2 tablets twice a day, Taking Toprol XL 200 MG Tablet Extended Release 24 Hour 1 tablet qd, Taking Nitroglycerin 0.4 MG tablet TAKE 1 TABLET UNDER THE TONGUE AS NEEDED , Taking Gabapentin 300 MG Capsule 1 capsule at bedtime, Taking Cyclobenzaprine HCl 10 MG Tablet 1 tablet as directed as needed, Taking Humalog Mix 75/25 Suspension 75 units befire breajfast and 70 units before dinner , Taking Plavix 75 MG Tablet TAKE 1 TABLET BY MOUTH EVERY DAY , Taking Vitamin D 2000 UNIT Tablet 1 tablet with a meal Once a day, Medication List reviewed and  reconciled with the patient     Allergies: Estradiol: rash.        Vitals: Wt 232.4, Wt change -1 lb, Ht 66, BMI 37.51, Pulse sitting 74, BP sitting 122/74.     Examination:     Cardiology Exam:         GENERAL APPEARANCE: pleasant, NAD, female, . HEENT: normal. CAROTID UPSTROKE: normal. JVD: flat. HEART: regular rate and rhythm, normal S1S2, no murmur, no rub, no gallop, or click. HEART MURMUR: none. LUNGS: clear to auscultation, no wheezing/rhonchi/rales. ABDOMEN: soft, non-tender,+ bowel sounds. EXTREMITIES: trace, bilateral, leg edema. PERIPHERAL PULSES: 2+, bilateral. NEUROLOGIC: grossly  intact. MOOD: normal.               Assessment:  1. Chest pain, midsternal - 786.51 (Primary), Atypical but in a female with prior LAD stenting who now seeks surgical clearance for cervical disc disease   2. Coronary atherosclerosis of native coronary artery - 414.01, Prior LAD stenting   3. Essential hypertension, benign - 401.1, Controlled   4. Congestive heart failure, unspecified - 428.0   5. Abnormal nuclear stress test - 794.39, proceed with cardiac catheterization for possible PCI at main lab       1. Chest pain, midsternal       Imaging: Chest PA/Lat (Ordered for 06/17/2012)            Thomas,Jodi 06/17/2012 03:46:58 PM > , x-ray completed. FERGUSON,CYNTHIA A 06/17/2012 05:16:53 PM > stable pre cath   Notes: Risks and benefits of cardiac catheterization have been reviewed including risk of stroke, heart attack, death, bleeding, renal impariment and arterial damage. There was ample oppurtuny to answer questions. Alternatives were discussed. Patient understands and wishes to proceed.  Metformin to be held 48 hrs before cath and after procedure. pt advised also not to take lasix the am of procedure.       2. Coronary atherosclerosis of native coronary artery  Continue Crestor Tablet, 40 mg, 1 tablet, Orally, once a day---followed by Dr Rulon Sera ;  Continue Nitroglycerin tablet, 0.4 MG, TAKE 1 TABLET UNDER THE TONGUE AS NEEDED ;  Continue Plavix Tablet, 75 MG, TAKE 1 TABLET BY MOUTH EVERY DAY .       3. Essential hypertension, benign  Continue Losartan Potassium, 100 mg, 1 tablet, once a day ;  Continue Toprol XL Tablet Extended Release 24 Hour, 200 MG, 1 tablet, Orally, qd .       4. Congestive heart failure, unspecified  Continue Lasix Tablet, 40 MG, 1 tablet, Orally, twice a day ;  Continue Potassium Chloride Crys CR Tablet Extended Release, 20 MEQ, 1 tablet, Orally, Twice a day .       5. Abnormal nuclear stress test       LAB: CBC with Diff      LAB: Comp Metabolic Panel       LAB: PT and PTT ([id number])    6. Others   Continue Symbicort Aerosol, 160-4.5 MCG/ACT, 2 puffs, Inhalation, Twice a day ;  Continue ProAir HFA Aerosol Solution, 108 (90 Base) MCG/ACT, 2 puffs as needed, Inhalation, every 4 hrs ;  Continue Spiriva HandiHaler Capsule, 18 MCG, 1 capsule by mouth, Inhalation, Once a day .          Procedures:      Venipuncture:          Venipuncture: Gasanova,Svetlana 06/17/2012 03:43:30 PM > , performed in left arm.             Labs:  Lab: CBC with Diff       WBC 9.5  4.0-11.0 - K/ul       RBC 4.62  4.20-5.40 - M/uL       HGB 13.2  12.0-16.0 - g/dL       HCT 16.1  09.6-04.5 - %       MCH 28.5  27.0-33.0 - pg       MPV 8.9  7.5-10.7 - fL       MCV 87.4  81.0-99.0 - fL       MCHC 32.7  32.0-36.0 - g/dL       RDW 40.9 H 81.1-91.4 - %       NRBC# 0.00  -        PLT 229  150-400 - K/uL       NEUT % 56.8  43.3-71.9 - %       NRBC% 0.10  - %       LYMPH% 33.0  16.8-43.5 - %       MONO % 6.3  4.6-12.4 - %       EOS % 3.6  0.0-7.8 - %       BASO % 0.3  0.0-1.0 - %       NEUT # 5.4  1.9-7.2 - K/uL       LYMPH# 3.10 H 1.10-2.70 - K/uL       MONO # 0.6  0.3-0.8 - K/uL       EOS # 0.3  0.0-0.6 - K/uL       BASO # 0.0  0.0-0.1 - K/uL                 FERGUSON,CYNTHIA A 06/17/2012 04:52:20 PM > for cath               Lab: Comp Metabolic Panel       GLUCOSE 142 H 70-99 - mg/dL       BUN 23  7-82 - mg/dL       CREATININE 9.56  0.60-1.30 - mg/dl       SODIUM 213  086-578 - mmol/L       POTASSIUM 4.1  3.5-5.5 - mmol/L       CHLORIDE 99  98-107 - mmol/L       C02 32  22-32 - mmol/L       ANION GAP 11.0  6.0-20.0 - mmol/L       CALCIUM 9.4  8.6-10.3 - mg/dL       T PROTEIN 6.9  4.6-9.6 - g/dL       ALBUMIN 4.5  2.9-5.2 - g/dL       T.BILI 0.8  0.3-1.0 - mg/dL       ALP 841  32-440 - U/L       AST 34  0-39 - U/L       ALT 40  0-52 - U/L       eGFR (NON-AFRICAN AMERICAN) 49 L >60 - calc       eGFR (AFRICAN AMERICAN) 59 L >60 - calc                  FERGUSON,CYNTHIA A 06/17/2012 05:40:25 PM > reviewed               Lab: PT and PTT ([id number])       aPTT 25  24-33 - SEC       INR 1.0  0.8-1.2 -        Prothrombin  Time 10.8  9.1-12.0 - SEC                 FERGUSON,CYNTHIA A 06/19/2012 07:03:22 AM > ok for cath           Procedure Codes: 96045 ECL CBC PLATELET DIFF, 80053 ECL COMP METABOLIC PANEL, 40981 BLOOD COLLECTION ROUTINE VENIPUNCTURE     Follow Up: HS pending cath (Reason: CAD, S/P cath)           Provider: Michaell Cowing. Emelda Fear, NP  Patient: [name], [name]  DOB: 1955-08-20  Date: 06/17/2012

## 2012-06-23 ENCOUNTER — Ambulatory Visit (HOSPITAL_COMMUNITY)
Admission: RE | Admit: 2012-06-23 | Discharge: 2012-06-23 | Disposition: A | Discharge status: Home / Self Care | Payer: Medicare Other | Source: Ambulatory Visit | Attending: Interventional Cardiology | Admitting: Interventional Cardiology

## 2012-06-23 ENCOUNTER — Encounter (HOSPITAL_COMMUNITY)
Admission: RE | Disposition: A | Discharge status: Home / Self Care | Payer: Self-pay | Source: Ambulatory Visit | Attending: Interventional Cardiology

## 2012-06-23 DIAGNOSIS — I2510 Atherosclerotic heart disease of native coronary artery without angina pectoris: Secondary | ICD-10-CM | POA: Diagnosis present

## 2012-06-23 DIAGNOSIS — Z9861 Coronary angioplasty status: Secondary | ICD-10-CM | POA: Insufficient documentation

## 2012-06-23 DIAGNOSIS — E119 Type 2 diabetes mellitus without complications: Secondary | ICD-10-CM | POA: Diagnosis not present

## 2012-06-23 DIAGNOSIS — I509 Heart failure, unspecified: Secondary | ICD-10-CM | POA: Diagnosis not present

## 2012-06-23 DIAGNOSIS — J45909 Unspecified asthma, uncomplicated: Secondary | ICD-10-CM | POA: Insufficient documentation

## 2012-06-23 DIAGNOSIS — I10 Essential (primary) hypertension: Secondary | ICD-10-CM | POA: Insufficient documentation

## 2012-06-23 DIAGNOSIS — Z8249 Family history of ischemic heart disease and other diseases of the circulatory system: Secondary | ICD-10-CM | POA: Diagnosis not present

## 2012-06-23 DIAGNOSIS — I209 Angina pectoris, unspecified: Secondary | ICD-10-CM | POA: Diagnosis not present

## 2012-06-23 DIAGNOSIS — M5030 Other cervical disc degeneration, unspecified cervical region: Secondary | ICD-10-CM | POA: Insufficient documentation

## 2012-06-23 DIAGNOSIS — R9439 Abnormal result of other cardiovascular function study: Secondary | ICD-10-CM | POA: Diagnosis present

## 2012-06-23 DIAGNOSIS — K219 Gastro-esophageal reflux disease without esophagitis: Secondary | ICD-10-CM | POA: Diagnosis not present

## 2012-06-23 HISTORY — PX: LEFT HEART CATHETERIZATION WITH CORONARY ANGIOGRAM: SHX5451

## 2012-06-23 LAB — GLUCOSE, CAPILLARY: Glucose-Capillary: 156 mg/dL — ABNORMAL HIGH (ref 70–99)

## 2012-06-23 SURGERY — LEFT HEART CATHETERIZATION WITH CORONARY ANGIOGRAM
Anesthesia: LOCAL

## 2012-06-23 MED ORDER — DIAZEPAM 5 MG PO TABS
ORAL_TABLET | ORAL | Status: AC
  Filled 2012-06-23: qty 1

## 2012-06-23 MED ORDER — SODIUM CHLORIDE 0.9 % IV SOLN
INTRAVENOUS | Status: DC
  Administered 2012-06-23: 12:00:00 via INTRAVENOUS

## 2012-06-23 MED ORDER — FENTANYL CITRATE 0.05 MG/ML IJ SOLN
INTRAMUSCULAR | Status: AC
  Filled 2012-06-23: qty 2

## 2012-06-23 MED ORDER — ASPIRIN 81 MG PO CHEW
CHEWABLE_TABLET | ORAL | Status: AC
  Filled 2012-06-23: qty 4

## 2012-06-23 MED ORDER — SODIUM CHLORIDE 0.9 % IV SOLN: 250.0000 mL | INTRAVENOUS | Status: DC | PRN

## 2012-06-23 MED ORDER — ACETAMINOPHEN 325 MG PO TABS: 650.0000 mg | ORAL_TABLET | ORAL | Status: DC | PRN

## 2012-06-23 MED ORDER — DIAZEPAM 5 MG PO TABS
5.0000 mg | ORAL_TABLET | ORAL | Status: AC
  Administered 2012-06-23: 5 mg via ORAL

## 2012-06-23 MED ORDER — MIDAZOLAM HCL 2 MG/2ML IJ SOLN
INTRAMUSCULAR | Status: AC
  Filled 2012-06-23: qty 2

## 2012-06-23 MED ORDER — ASPIRIN 81 MG PO CHEW
324.0000 mg | CHEWABLE_TABLET | ORAL | Status: AC
  Administered 2012-06-23: 324 mg via ORAL

## 2012-06-23 MED ORDER — SODIUM CHLORIDE 0.9 % IJ SOLN: 3.0000 mL | Freq: Two times a day (BID) | INTRAMUSCULAR | Status: DC

## 2012-06-23 MED ORDER — SODIUM CHLORIDE 0.9 % IJ SOLN: 3.0000 mL | INTRAMUSCULAR | Status: DC | PRN

## 2012-06-23 MED ORDER — VERAPAMIL HCL 2.5 MG/ML IV SOLN
INTRAVENOUS | Status: AC
  Filled 2012-06-23: qty 2

## 2012-06-23 MED ORDER — HEPARIN SODIUM (PORCINE) 1000 UNIT/ML IJ SOLN
INTRAMUSCULAR | Status: AC
  Filled 2012-06-23: qty 1

## 2012-06-23 MED ORDER — SODIUM CHLORIDE 0.9 % IV SOLN: INTRAVENOUS | Status: DC

## 2012-06-23 MED ORDER — ONDANSETRON HCL 4 MG/2ML IJ SOLN: 4.0000 mg | Freq: Four times a day (QID) | INTRAMUSCULAR | Status: DC | PRN

## 2012-06-23 MED ORDER — HEPARIN (PORCINE) IN NACL 2-0.9 UNIT/ML-% IJ SOLN
INTRAMUSCULAR | Status: AC
  Filled 2012-06-23: qty 1000

## 2012-06-23 MED ORDER — LIDOCAINE HCL (PF) 1 % IJ SOLN
INTRAMUSCULAR | Status: AC
  Filled 2012-06-23: qty 30

## 2012-06-23 NOTE — H&P (Signed)
The patient has been having recurring episodes of chest pressure with exertion and post exertion. Borderline response to nitroglycerin. Recent nuclear study demonstrated mild anterior wall ischemia. This was mostly in the apex. She is planning to have a large surgical procedure involving her neck. The studies being done to define the LAD anatomy given prior multiple stents.

## 2012-06-23 NOTE — CV Procedure (Signed)
     Diagnostic Cardiac Catheterization Report  [name]  57 y.o.  female 04/15/1955  Procedure Date: 06/23/2012 Referring Physician: Kari Baars, M.D. Primary Cardiologist:: [facility]WB Katrinka Blazing III M.D.   PROCEDURE:  Left heart catheterization with selective coronary angiography, left ventriculogram.  INDICATIONS:  Apical ischemia on nuclear scintigraphy and atypical angina in a patient who is preoperative for cervical disc surgery . The studies being done to rule out restenosis and stent center in the patient's proximal LAD.  The risks, benefits, and details of the procedure were explained to the patient.  The patient verbalized understanding and wanted to proceed.  Informed written consent was obtained.  PROCEDURE TECHNIQUE:  After Xylocaine anesthesia a 5 French sheath was placed in the right radial artery with a single anterior needle wall stick.   Coronary angiography was done using a 5 French A2 MP, 3.5 cm left Judkins catheter.  Left ventriculography was not performed. I. aborted attempts to perform left ventriculography because advancement of the catheter caused significant forearm discomfort. I was unable to advance a multipurpose catheter into the ventricle despite excellent position of the wire in the left ventricle.   CONTRAST:  Total of 65 cc.  COMPLICATIONS:  None.    HEMODYNAMICS:  Aortic pressure was 132/76 mmHg; LV pressure was not recorded.  ANGIOGRAPHIC DATA:   The left main coronary artery is widely patent.  The left anterior descending artery is the proximal stents are widely patent. There is generalized 30-50% narrowing but no high-grade obstruction. In the distal third of the LAD there is diffuse disease with an eccentric 75-85% stenosis. This likely represents the lesion cause of the apical defect on nuclear scintigraphy. Its distal location within a region of diffuse disease causes me to feel that conservative medical management is the best treatment  option.  The left circumflex artery is gives origin to 4 obtuse marginal branches. The first marginal has he appearance of a ramus branch. 2 smaller obtuse marginal branches are noted followed by a larger left ventricular posterolateral branch. No significant obstruction is noted..  The right coronary artery is could never be selectively engaged but adequate visualization was obtained to demonstrate that the vessel is widely patent.Marland Kitchen  LEFT VENTRICULOGRAM:  Left ventricular angiogram was not done because of inability to advance the catheter into the left ventricle. The difficulty was significant pain in the forearm any time the catheter was advanced beyond a certain point.  IMPRESSIONS:  1. New apical defect on nuclear scintigraphy correlates with a distal LAD 75-85% stenosis. The proximal LAD stents are patent.  2. The RCA and circumflex are widely patent.  3. LVEF 75% by recent nuclear scintigraphy. LV gram impressions were not recorded because of difficulties as noted above.   RECOMMENDATION:  Aggressive medical therapy for the distal LAD stenosis. A fall back option would be to stent the distal vessel but quit talking a very small stent diameter which increases the risk of poor long term outcome.Marland Kitchen

## 2012-07-18 DIAGNOSIS — G609 Hereditary and idiopathic neuropathy, unspecified: Secondary | ICD-10-CM | POA: Diagnosis not present

## 2012-07-18 DIAGNOSIS — E781 Pure hyperglyceridemia: Secondary | ICD-10-CM | POA: Diagnosis not present

## 2012-07-18 DIAGNOSIS — IMO0001 Reserved for inherently not codable concepts without codable children: Secondary | ICD-10-CM | POA: Diagnosis not present

## 2012-07-18 DIAGNOSIS — I10 Essential (primary) hypertension: Secondary | ICD-10-CM | POA: Diagnosis not present

## 2012-07-18 DIAGNOSIS — E039 Hypothyroidism, unspecified: Secondary | ICD-10-CM | POA: Diagnosis not present

## 2012-07-20 DIAGNOSIS — I10 Essential (primary) hypertension: Secondary | ICD-10-CM | POA: Diagnosis not present

## 2012-07-20 DIAGNOSIS — I209 Angina pectoris, unspecified: Secondary | ICD-10-CM | POA: Diagnosis not present

## 2012-07-20 DIAGNOSIS — I2510 Atherosclerotic heart disease of native coronary artery without angina pectoris: Secondary | ICD-10-CM | POA: Diagnosis not present

## 2012-07-28 DIAGNOSIS — M5020 Other cervical disc displacement, unspecified cervical region: Secondary | ICD-10-CM | POA: Diagnosis not present

## 2012-08-18 DIAGNOSIS — I10 Essential (primary) hypertension: Secondary | ICD-10-CM | POA: Diagnosis not present

## 2012-08-18 DIAGNOSIS — Z0000 Encounter for general adult medical examination without abnormal findings: Secondary | ICD-10-CM | POA: Diagnosis not present

## 2012-08-18 DIAGNOSIS — E039 Hypothyroidism, unspecified: Secondary | ICD-10-CM | POA: Diagnosis not present

## 2012-08-18 DIAGNOSIS — K223 Perforation of esophagus: Secondary | ICD-10-CM | POA: Diagnosis not present

## 2012-08-18 DIAGNOSIS — E109 Type 1 diabetes mellitus without complications: Secondary | ICD-10-CM | POA: Diagnosis not present

## 2012-08-18 DIAGNOSIS — E785 Hyperlipidemia, unspecified: Secondary | ICD-10-CM | POA: Diagnosis not present

## 2012-08-26 ENCOUNTER — Other Ambulatory Visit (HOSPITAL_COMMUNITY): Payer: Self-pay | Admitting: Pulmonary Disease

## 2012-08-26 DIAGNOSIS — R109 Unspecified abdominal pain: Secondary | ICD-10-CM

## 2012-08-30 ENCOUNTER — Other Ambulatory Visit (HOSPITAL_COMMUNITY): Payer: Medicare Other

## 2012-09-07 ENCOUNTER — Ambulatory Visit (HOSPITAL_COMMUNITY): Payer: Medicare Other

## 2012-09-20 ENCOUNTER — Ambulatory Visit (HOSPITAL_COMMUNITY)
Admission: RE | Admit: 2012-09-20 | Discharge: 2012-09-20 | Disposition: A | Discharge status: Home / Self Care | Payer: Medicare Other | Source: Ambulatory Visit | Attending: Pulmonary Disease | Admitting: Pulmonary Disease

## 2012-09-20 DIAGNOSIS — R109 Unspecified abdominal pain: Secondary | ICD-10-CM

## 2012-09-20 DIAGNOSIS — K769 Liver disease, unspecified: Secondary | ICD-10-CM | POA: Insufficient documentation

## 2012-09-20 DIAGNOSIS — Z9089 Acquired absence of other organs: Secondary | ICD-10-CM | POA: Diagnosis not present

## 2012-09-20 DIAGNOSIS — K7689 Other specified diseases of liver: Secondary | ICD-10-CM | POA: Diagnosis not present

## 2012-10-04 ENCOUNTER — Inpatient Hospital Stay (HOSPITAL_COMMUNITY): Admission: RE | Admit: 2012-10-04 | Payer: Medicare Other | Source: Ambulatory Visit

## 2012-10-24 DIAGNOSIS — E119 Type 2 diabetes mellitus without complications: Secondary | ICD-10-CM | POA: Diagnosis not present

## 2012-10-24 DIAGNOSIS — I739 Peripheral vascular disease, unspecified: Secondary | ICD-10-CM | POA: Diagnosis not present

## 2012-10-24 DIAGNOSIS — I10 Essential (primary) hypertension: Secondary | ICD-10-CM | POA: Diagnosis not present

## 2012-10-24 DIAGNOSIS — I209 Angina pectoris, unspecified: Secondary | ICD-10-CM | POA: Diagnosis not present

## 2012-10-24 DIAGNOSIS — I2510 Atherosclerotic heart disease of native coronary artery without angina pectoris: Secondary | ICD-10-CM | POA: Diagnosis not present

## 2012-10-26 DIAGNOSIS — I70219 Atherosclerosis of native arteries of extremities with intermittent claudication, unspecified extremity: Secondary | ICD-10-CM | POA: Diagnosis not present

## 2012-11-14 ENCOUNTER — Ambulatory Visit (HOSPITAL_COMMUNITY): Payer: Medicare Other

## 2012-11-30 ENCOUNTER — Ambulatory Visit: Payer: Medicare Other | Admitting: Interventional Cardiology

## 2012-12-01 ENCOUNTER — Ambulatory Visit (HOSPITAL_COMMUNITY): Payer: Medicare Other

## 2012-12-03 DIAGNOSIS — E039 Hypothyroidism, unspecified: Secondary | ICD-10-CM | POA: Diagnosis not present

## 2012-12-03 DIAGNOSIS — Z794 Long term (current) use of insulin: Secondary | ICD-10-CM | POA: Diagnosis not present

## 2012-12-03 DIAGNOSIS — I252 Old myocardial infarction: Secondary | ICD-10-CM | POA: Diagnosis not present

## 2012-12-03 DIAGNOSIS — M1710 Unilateral primary osteoarthritis, unspecified knee: Secondary | ICD-10-CM | POA: Diagnosis not present

## 2012-12-03 DIAGNOSIS — Z79899 Other long term (current) drug therapy: Secondary | ICD-10-CM | POA: Diagnosis not present

## 2012-12-03 DIAGNOSIS — M25469 Effusion, unspecified knee: Secondary | ICD-10-CM | POA: Diagnosis not present

## 2012-12-03 DIAGNOSIS — IMO0002 Reserved for concepts with insufficient information to code with codable children: Secondary | ICD-10-CM | POA: Diagnosis not present

## 2012-12-03 DIAGNOSIS — E119 Type 2 diabetes mellitus without complications: Secondary | ICD-10-CM | POA: Diagnosis not present

## 2012-12-07 DIAGNOSIS — Z23 Encounter for immunization: Secondary | ICD-10-CM | POA: Diagnosis not present

## 2012-12-07 DIAGNOSIS — E109 Type 1 diabetes mellitus without complications: Secondary | ICD-10-CM | POA: Diagnosis not present

## 2012-12-07 DIAGNOSIS — M1990 Unspecified osteoarthritis, unspecified site: Secondary | ICD-10-CM | POA: Diagnosis not present

## 2012-12-07 DIAGNOSIS — I10 Essential (primary) hypertension: Secondary | ICD-10-CM | POA: Diagnosis not present

## 2012-12-07 DIAGNOSIS — E039 Hypothyroidism, unspecified: Secondary | ICD-10-CM | POA: Diagnosis not present

## 2012-12-09 ENCOUNTER — Ambulatory Visit (HOSPITAL_COMMUNITY): Payer: Medicare Other

## 2012-12-10 ENCOUNTER — Encounter: Payer: Self-pay | Admitting: *Deleted

## 2012-12-10 ENCOUNTER — Encounter: Payer: Self-pay | Admitting: Interventional Cardiology

## 2012-12-10 DIAGNOSIS — I10 Essential (primary) hypertension: Secondary | ICD-10-CM | POA: Insufficient documentation

## 2012-12-10 DIAGNOSIS — E079 Disorder of thyroid, unspecified: Secondary | ICD-10-CM | POA: Insufficient documentation

## 2012-12-13 ENCOUNTER — Ambulatory Visit: Payer: Medicare Other | Admitting: Interventional Cardiology

## 2012-12-20 ENCOUNTER — Other Ambulatory Visit: Payer: Self-pay | Admitting: Family Medicine

## 2012-12-20 ENCOUNTER — Ambulatory Visit (HOSPITAL_COMMUNITY)
Admission: RE | Admit: 2012-12-20 | Discharge: 2012-12-20 | Disposition: A | Discharge status: Home / Self Care | Payer: Medicare Other | Source: Ambulatory Visit | Attending: Pulmonary Disease | Admitting: Pulmonary Disease

## 2012-12-20 DIAGNOSIS — Z1231 Encounter for screening mammogram for malignant neoplasm of breast: Secondary | ICD-10-CM | POA: Diagnosis not present

## 2012-12-20 DIAGNOSIS — Z139 Encounter for screening, unspecified: Secondary | ICD-10-CM

## 2012-12-26 ENCOUNTER — Telehealth: Payer: Self-pay

## 2012-12-26 MED ORDER — ISOSORBIDE MONONITRATE ER 60 MG PO TB24: 60.0000 mg | ORAL_TABLET | Freq: Every day | ORAL | Status: DC

## 2012-12-26 MED ORDER — AMLODIPINE BESYLATE 10 MG PO TABS: 10.0000 mg | ORAL_TABLET | Freq: Every day | ORAL | Status: DC

## 2012-12-26 MED ORDER — CLOPIDOGREL BISULFATE 75 MG PO TABS: 75.0000 mg | ORAL_TABLET | Freq: Every day | ORAL | Status: DC

## 2012-12-26 NOTE — Telephone Encounter (Signed)
done

## 2013-01-02 ENCOUNTER — Encounter: Payer: Self-pay | Admitting: Interventional Cardiology

## 2013-01-02 ENCOUNTER — Ambulatory Visit (INDEPENDENT_AMBULATORY_CARE_PROVIDER_SITE_OTHER): Payer: Medicare Other | Admitting: Interventional Cardiology

## 2013-01-02 VITALS — BP 100/74 | HR 80 | Ht 64.0 in | Wt 238.0 lb

## 2013-01-02 DIAGNOSIS — I2510 Atherosclerotic heart disease of native coronary artery without angina pectoris: Secondary | ICD-10-CM

## 2013-01-02 DIAGNOSIS — I10 Essential (primary) hypertension: Secondary | ICD-10-CM

## 2013-01-02 DIAGNOSIS — Z01810 Encounter for preprocedural cardiovascular examination: Secondary | ICD-10-CM

## 2013-01-02 DIAGNOSIS — R9439 Abnormal result of other cardiovascular function study: Secondary | ICD-10-CM

## 2013-01-02 DIAGNOSIS — E785 Hyperlipidemia, unspecified: Secondary | ICD-10-CM | POA: Diagnosis not present

## 2013-01-02 DIAGNOSIS — R079 Chest pain, unspecified: Secondary | ICD-10-CM

## 2013-01-02 MED ORDER — RANOLAZINE ER 500 MG PO TB12: 500.0000 mg | ORAL_TABLET | Freq: Two times a day (BID) | ORAL | Status: DC

## 2013-01-02 NOTE — Progress Notes (Signed)
Patient ID: [name], female   DOB: [birth year], 57 y.o.   MRN: [id number]    [address], [address] [place], [place]  [address] Phone: [phone] Fax:  [phone]  Date:  01/02/2013   ID:  [name], DOB 1955/12/17, MRN [id number]  PCP:  Fredirick Maudlin, MD   ASSESSMENT:  1. Coronary artery disease, stable without significant prolonged angina. Mildly abnormal myocardial perfusion study with apical ischemia 2. Hypertension under good control 3. Chest pain syndrome, noncardiac possibly related to cervical disc and/or gastroesophageal reflux disease 4. Preoperative cardiovascular exam 5. Hyperlipidemia  PLAN:  1. Prescription for Ranexa 500 mg twice a day 2. Clinical followup in 6 months 3. Cleared for upcoming knee replacement surgery 4. Will need Plavix discontinued 7 days prior to procedure   SUBJECTIVE: [name] is a 57 y.o. female who has had stable angina. She has been somewhat limited in physical activity because of severe left knee pain. She also has significant cervical disc disease that is also scheduled to be operated upon. Since I last saw her, she states that Ranexa has improved her overall clinical condition. She is rarely uses nitroglycerin. She denies angina. Her last myocardial perfusion study revealed a very small region of apical ischemia. She is proximal and mid LAD stents. She denies dyspnea. No angina at rest. She has become significantly sedentary because of her knee discomfort.   Wt Readings from Last 3 Encounters:  01/02/13 238 lb (107.956 kg)  06/23/12 233 lb (105.688 kg)  06/23/12 233 lb (105.688 kg)     Past Medical History  Diagnosis Date  . Diabetes mellitus   . Hypertension   . Asthma   . Thyroid disease   . Coronary artery disease   . Hypercholesteremia   . Panic attack   . Sleep apnea   . Chest pain   . GERD (gastroesophageal reflux disease)     Current Outpatient Prescriptions  Medication Sig Dispense  Refill  . albuterol (PROVENTIL HFA;VENTOLIN HFA) 108 (90 BASE) MCG/ACT inhaler Inhale 2 puffs into the lungs every 4 (four) hours as needed for wheezing.      Marland Kitchen amLODipine (NORVASC) 10 MG tablet Take 1 tablet (10 mg total) by mouth daily.  90 tablet  0  . aspirin EC 81 MG tablet Take 81 mg by mouth daily.      . budesonide-formoterol (SYMBICORT) 160-4.5 MCG/ACT inhaler Inhale 2 puffs into the lungs 2 (two) times daily.      . clopidogrel (PLAVIX) 75 MG tablet Take 1 tablet (75 mg total) by mouth daily.  90 tablet  0  . cyclobenzaprine (FLEXERIL) 10 MG tablet Take 10 mg by mouth 3 (three) times daily as needed for muscle spasms.      . diazepam (VALIUM) 5 MG tablet Take 5 mg by mouth daily.      . furosemide (LASIX) 40 MG tablet Take 40 mg by mouth 2 (two) times daily.        Marland Kitchen gabapentin (NEURONTIN) 300 MG capsule Take 300 mg by mouth at bedtime.      Marland Kitchen HYDROcodone-acetaminophen (NORCO) 10-325 MG per tablet Take 1 tablet by mouth every 6 (six) hours as needed for pain.      Marland Kitchen insulin aspart protamine-insulin aspart (NOVOLOG 70/30) (70-30) 100 UNIT/ML injection Inject into the skin 2 (two) times daily with a meal. 50units am 40 in evening       . insulin lispro protamine-lispro (HUMALOG 75/25) (75-25) 100 UNIT/ML  SUSP Inject 70-75 Units into the skin 2 (two) times daily with a meal. Takes 75 units in am and 70 units in pm      . isosorbide mononitrate (IMDUR) 60 MG 24 hr tablet Take 1 tablet (60 mg total) by mouth daily.  90 tablet  0  . levothyroxine (SYNTHROID, LEVOTHROID) 175 MCG tablet Take 175 mcg by mouth daily before breakfast.      . losartan (COZAAR) 100 MG tablet Take 100 mg by mouth daily.      . metformin (FORTAMET) 500 MG (OSM) 24 hr tablet Take by mouth 2 (two) times daily with a meal.        . metoprolol (TOPROL-XL) 200 MG 24 hr tablet Take 200 mg by mouth daily.        . mirtazapine (REMERON) 30 MG tablet Take 30 mg by mouth at bedtime.      . nitroGLYCERIN (NITROSTAT) 0.4 MG SL  tablet Place 0.4 mg under the tongue every 5 (five) minutes as needed for chest pain.      Marland Kitchen omeprazole (PRILOSEC) 20 MG capsule Take 20 mg by mouth daily.      . potassium chloride SA (K-DUR,KLOR-CON) 20 MEQ tablet Take 40 mEq by mouth daily.      . rosuvastatin (CRESTOR) 40 MG tablet Take 40 mg by mouth daily.        Marland Kitchen tiotropium (SPIRIVA) 18 MCG inhalation capsule Place 18 mcg into inhaler and inhale daily.       No current facility-administered medications for this visit.    Allergies:    Allergies  Allergen Reactions  . Tape     Adhesive breaks me out    Social History:  The patient  reports that she has never smoked. She does not have any smokeless tobacco history on file. She reports that she does not drink alcohol or use illicit drugs.   ROS:  Please see the history of present illness.   Denies orthopnea, PND, palpitations, edema, transient neurological symptoms, and bleeding.   All other systems reviewed and negative.   OBJECTIVE: VS:  BP 100/74  Pulse 80  Ht 5\' 4"  (1.626 m)  Wt 238 lb (107.956 kg)  BMI 40.83 kg/m2  SpO2 92% Well nourished, well developed, in no acute distress, obese HEENT: normal Neck: JVD flat. Carotid bruit 2+ without bruits  Cardiac:  normal S1, S2; RRR; 1-2/6 systolic murmur Lungs:  clear to auscultation bilaterally, no wheezing, rhonchi or rales Abd: soft, nontender, no hepatomegaly Ext: Edema absent. Pulses 2+ Skin: warm and dry Neuro:  CNs 2-12 intact, no focal abnormalities noted  EKG:  NSR       Signed, Darci Needle III, MD 01/02/2013 4:12 PM

## 2013-01-02 NOTE — Patient Instructions (Signed)
Start Ranexa 500mg  twice daily. An Rx has been sent to your pharmacy  Your physician wants you to follow-up in: 6 months You will receive a reminder letter in the mail two months in advance. If you don't receive a letter, please call our office to schedule the follow-up appointment.

## 2013-01-04 DIAGNOSIS — M5020 Other cervical disc displacement, unspecified cervical region: Secondary | ICD-10-CM | POA: Diagnosis not present

## 2013-01-06 ENCOUNTER — Other Ambulatory Visit: Payer: Self-pay

## 2013-01-06 MED ORDER — NITROGLYCERIN 0.4 MG SL SUBL: 0.4000 mg | SUBLINGUAL_TABLET | SUBLINGUAL | Status: DC | PRN

## 2013-02-20 DIAGNOSIS — M1710 Unilateral primary osteoarthritis, unspecified knee: Secondary | ICD-10-CM | POA: Diagnosis not present

## 2013-02-20 DIAGNOSIS — M25569 Pain in unspecified knee: Secondary | ICD-10-CM | POA: Diagnosis not present

## 2013-02-20 DIAGNOSIS — I252 Old myocardial infarction: Secondary | ICD-10-CM | POA: Diagnosis not present

## 2013-02-20 DIAGNOSIS — IMO0002 Reserved for concepts with insufficient information to code with codable children: Secondary | ICD-10-CM | POA: Diagnosis not present

## 2013-03-07 DIAGNOSIS — Z01818 Encounter for other preprocedural examination: Secondary | ICD-10-CM | POA: Diagnosis not present

## 2013-03-14 DIAGNOSIS — N35919 Unspecified urethral stricture, male, unspecified site: Secondary | ICD-10-CM | POA: Diagnosis not present

## 2013-03-15 DIAGNOSIS — M899 Disorder of bone, unspecified: Secondary | ICD-10-CM | POA: Diagnosis not present

## 2013-03-15 DIAGNOSIS — IMO0002 Reserved for concepts with insufficient information to code with codable children: Secondary | ICD-10-CM | POA: Diagnosis not present

## 2013-03-15 DIAGNOSIS — M1710 Unilateral primary osteoarthritis, unspecified knee: Secondary | ICD-10-CM | POA: Diagnosis not present

## 2013-03-29 DIAGNOSIS — E039 Hypothyroidism, unspecified: Secondary | ICD-10-CM | POA: Diagnosis not present

## 2013-03-29 DIAGNOSIS — E785 Hyperlipidemia, unspecified: Secondary | ICD-10-CM | POA: Diagnosis not present

## 2013-03-29 DIAGNOSIS — I499 Cardiac arrhythmia, unspecified: Secondary | ICD-10-CM | POA: Diagnosis not present

## 2013-03-29 DIAGNOSIS — I10 Essential (primary) hypertension: Secondary | ICD-10-CM | POA: Diagnosis not present

## 2013-04-10 ENCOUNTER — Telehealth: Payer: Self-pay | Admitting: Interventional Cardiology

## 2013-04-10 NOTE — Telephone Encounter (Signed)
New message    Office is waiting is waiting on cardiac clearance.    Fax # [phone].  Patient think it's  [facility].

## 2013-04-11 ENCOUNTER — Other Ambulatory Visit: Payer: Self-pay | Admitting: *Deleted

## 2013-04-11 MED ORDER — ISOSORBIDE MONONITRATE ER 60 MG PO TB24: 60.0000 mg | ORAL_TABLET | Freq: Every day | ORAL | Status: DC

## 2013-04-11 MED ORDER — AMLODIPINE BESYLATE 10 MG PO TABS: 10.0000 mg | ORAL_TABLET | Freq: Every day | ORAL | Status: DC

## 2013-04-11 MED ORDER — CLOPIDOGREL BISULFATE 75 MG PO TABS: 75.0000 mg | ORAL_TABLET | Freq: Every day | ORAL | Status: DC

## 2013-04-12 ENCOUNTER — Telehealth: Payer: Self-pay | Admitting: Interventional Cardiology

## 2013-04-12 ENCOUNTER — Telehealth: Payer: Self-pay

## 2013-04-12 DIAGNOSIS — I10 Essential (primary) hypertension: Secondary | ICD-10-CM | POA: Diagnosis not present

## 2013-04-12 DIAGNOSIS — IMO0001 Reserved for inherently not codable concepts without codable children: Secondary | ICD-10-CM | POA: Diagnosis not present

## 2013-04-12 DIAGNOSIS — G609 Hereditary and idiopathic neuropathy, unspecified: Secondary | ICD-10-CM | POA: Diagnosis not present

## 2013-04-12 DIAGNOSIS — E781 Pure hyperglyceridemia: Secondary | ICD-10-CM | POA: Diagnosis not present

## 2013-04-12 DIAGNOSIS — E559 Vitamin D deficiency, unspecified: Secondary | ICD-10-CM | POA: Diagnosis not present

## 2013-04-12 DIAGNOSIS — E039 Hypothyroidism, unspecified: Secondary | ICD-10-CM | POA: Diagnosis not present

## 2013-04-12 NOTE — Telephone Encounter (Signed)
Cardiac clearance given to medical records to fax to [facility] [phone]

## 2013-04-12 NOTE — Telephone Encounter (Signed)
Received request from Nurse fax box, documents faxed for surgical clearance. To: [facility] Fax number: [phone]  Attention: [phone]/kdm

## 2013-04-20 ENCOUNTER — Telehealth: Payer: Self-pay | Admitting: Interventional Cardiology

## 2013-04-20 NOTE — Telephone Encounter (Signed)
returned pt call.adv pt that clearance was faxed to [facility] on 04/12/13 with confirmation. will refax again.pt verbalized understanding

## 2013-04-20 NOTE — Telephone Encounter (Signed)
New message     Dr Case needs clearance for knee surgery.  Form was faxed 2wk ago.  pls fax clearance to [phone] or [phone] (pt can't tell if it is a 3 or 8)

## 2013-04-27 ENCOUNTER — Other Ambulatory Visit: Payer: Self-pay | Admitting: Interventional Cardiology

## 2013-05-11 DIAGNOSIS — M47817 Spondylosis without myelopathy or radiculopathy, lumbosacral region: Secondary | ICD-10-CM | POA: Diagnosis not present

## 2013-05-18 ENCOUNTER — Telehealth: Payer: Self-pay

## 2013-05-18 DIAGNOSIS — Z7901 Long term (current) use of anticoagulants: Secondary | ICD-10-CM | POA: Diagnosis not present

## 2013-05-18 DIAGNOSIS — Z01818 Encounter for other preprocedural examination: Secondary | ICD-10-CM | POA: Diagnosis not present

## 2013-05-18 DIAGNOSIS — Z79899 Other long term (current) drug therapy: Secondary | ICD-10-CM | POA: Diagnosis not present

## 2013-05-18 DIAGNOSIS — IMO0002 Reserved for concepts with insufficient information to code with codable children: Secondary | ICD-10-CM | POA: Diagnosis not present

## 2013-05-18 MED ORDER — NITROGLYCERIN 0.4 MG SL SUBL: 0.4000 mg | SUBLINGUAL_TABLET | SUBLINGUAL | Status: DC | PRN

## 2013-05-19 NOTE — Telephone Encounter (Signed)
refill 

## 2013-05-22 DIAGNOSIS — M1710 Unilateral primary osteoarthritis, unspecified knee: Secondary | ICD-10-CM | POA: Diagnosis not present

## 2013-05-22 DIAGNOSIS — Z6841 Body Mass Index (BMI) 40.0 and over, adult: Secondary | ICD-10-CM | POA: Diagnosis not present

## 2013-05-22 DIAGNOSIS — E039 Hypothyroidism, unspecified: Secondary | ICD-10-CM | POA: Diagnosis present

## 2013-05-22 DIAGNOSIS — Z7902 Long term (current) use of antithrombotics/antiplatelets: Secondary | ICD-10-CM | POA: Diagnosis not present

## 2013-05-22 DIAGNOSIS — M25569 Pain in unspecified knee: Secondary | ICD-10-CM | POA: Diagnosis not present

## 2013-05-22 DIAGNOSIS — Z8541 Personal history of malignant neoplasm of cervix uteri: Secondary | ICD-10-CM | POA: Diagnosis not present

## 2013-05-22 DIAGNOSIS — IMO0002 Reserved for concepts with insufficient information to code with codable children: Secondary | ICD-10-CM | POA: Diagnosis not present

## 2013-05-22 DIAGNOSIS — Z794 Long term (current) use of insulin: Secondary | ICD-10-CM | POA: Diagnosis not present

## 2013-05-22 DIAGNOSIS — J45909 Unspecified asthma, uncomplicated: Secondary | ICD-10-CM | POA: Diagnosis not present

## 2013-05-22 DIAGNOSIS — Z79899 Other long term (current) drug therapy: Secondary | ICD-10-CM | POA: Diagnosis not present

## 2013-05-22 DIAGNOSIS — Z7982 Long term (current) use of aspirin: Secondary | ICD-10-CM | POA: Diagnosis not present

## 2013-05-22 DIAGNOSIS — Z471 Aftercare following joint replacement surgery: Secondary | ICD-10-CM | POA: Diagnosis not present

## 2013-05-22 DIAGNOSIS — E119 Type 2 diabetes mellitus without complications: Secondary | ICD-10-CM | POA: Diagnosis not present

## 2013-05-22 DIAGNOSIS — G4730 Sleep apnea, unspecified: Secondary | ICD-10-CM | POA: Diagnosis present

## 2013-05-22 DIAGNOSIS — Z9861 Coronary angioplasty status: Secondary | ICD-10-CM | POA: Diagnosis not present

## 2013-05-22 DIAGNOSIS — I252 Old myocardial infarction: Secondary | ICD-10-CM | POA: Diagnosis not present

## 2013-05-22 DIAGNOSIS — Z96659 Presence of unspecified artificial knee joint: Secondary | ICD-10-CM | POA: Diagnosis not present

## 2013-05-22 DIAGNOSIS — I10 Essential (primary) hypertension: Secondary | ICD-10-CM | POA: Diagnosis not present

## 2013-05-23 DIAGNOSIS — J45909 Unspecified asthma, uncomplicated: Secondary | ICD-10-CM | POA: Diagnosis not present

## 2013-05-23 DIAGNOSIS — Z96659 Presence of unspecified artificial knee joint: Secondary | ICD-10-CM | POA: Diagnosis not present

## 2013-05-23 DIAGNOSIS — M1710 Unilateral primary osteoarthritis, unspecified knee: Secondary | ICD-10-CM | POA: Diagnosis not present

## 2013-05-23 DIAGNOSIS — I10 Essential (primary) hypertension: Secondary | ICD-10-CM | POA: Diagnosis not present

## 2013-05-23 DIAGNOSIS — IMO0002 Reserved for concepts with insufficient information to code with codable children: Secondary | ICD-10-CM | POA: Diagnosis not present

## 2013-05-23 DIAGNOSIS — Z7902 Long term (current) use of antithrombotics/antiplatelets: Secondary | ICD-10-CM | POA: Diagnosis not present

## 2013-05-23 DIAGNOSIS — E119 Type 2 diabetes mellitus without complications: Secondary | ICD-10-CM | POA: Diagnosis not present

## 2013-05-23 DIAGNOSIS — Z79899 Other long term (current) drug therapy: Secondary | ICD-10-CM | POA: Diagnosis not present

## 2013-05-23 DIAGNOSIS — Z471 Aftercare following joint replacement surgery: Secondary | ICD-10-CM | POA: Diagnosis not present

## 2013-05-23 DIAGNOSIS — Z6841 Body Mass Index (BMI) 40.0 and over, adult: Secondary | ICD-10-CM | POA: Diagnosis not present

## 2013-05-24 DIAGNOSIS — M1710 Unilateral primary osteoarthritis, unspecified knee: Secondary | ICD-10-CM | POA: Diagnosis not present

## 2013-05-24 DIAGNOSIS — IMO0002 Reserved for concepts with insufficient information to code with codable children: Secondary | ICD-10-CM | POA: Diagnosis not present

## 2013-05-26 DIAGNOSIS — R269 Unspecified abnormalities of gait and mobility: Secondary | ICD-10-CM | POA: Diagnosis not present

## 2013-05-26 DIAGNOSIS — M79609 Pain in unspecified limb: Secondary | ICD-10-CM | POA: Diagnosis not present

## 2013-05-26 DIAGNOSIS — IMO0001 Reserved for inherently not codable concepts without codable children: Secondary | ICD-10-CM | POA: Diagnosis not present

## 2013-05-26 DIAGNOSIS — M6281 Muscle weakness (generalized): Secondary | ICD-10-CM | POA: Diagnosis not present

## 2013-05-29 DIAGNOSIS — M6281 Muscle weakness (generalized): Secondary | ICD-10-CM | POA: Diagnosis not present

## 2013-05-29 DIAGNOSIS — M79609 Pain in unspecified limb: Secondary | ICD-10-CM | POA: Diagnosis not present

## 2013-05-29 DIAGNOSIS — IMO0001 Reserved for inherently not codable concepts without codable children: Secondary | ICD-10-CM | POA: Diagnosis not present

## 2013-05-29 DIAGNOSIS — R269 Unspecified abnormalities of gait and mobility: Secondary | ICD-10-CM | POA: Diagnosis not present

## 2013-05-31 DIAGNOSIS — M79609 Pain in unspecified limb: Secondary | ICD-10-CM | POA: Diagnosis not present

## 2013-05-31 DIAGNOSIS — IMO0001 Reserved for inherently not codable concepts without codable children: Secondary | ICD-10-CM | POA: Diagnosis not present

## 2013-05-31 DIAGNOSIS — M6281 Muscle weakness (generalized): Secondary | ICD-10-CM | POA: Diagnosis not present

## 2013-05-31 DIAGNOSIS — R269 Unspecified abnormalities of gait and mobility: Secondary | ICD-10-CM | POA: Diagnosis not present

## 2013-06-02 DIAGNOSIS — R269 Unspecified abnormalities of gait and mobility: Secondary | ICD-10-CM | POA: Diagnosis not present

## 2013-06-02 DIAGNOSIS — Z96659 Presence of unspecified artificial knee joint: Secondary | ICD-10-CM | POA: Diagnosis not present

## 2013-06-02 DIAGNOSIS — M6281 Muscle weakness (generalized): Secondary | ICD-10-CM | POA: Diagnosis not present

## 2013-06-02 DIAGNOSIS — IMO0001 Reserved for inherently not codable concepts without codable children: Secondary | ICD-10-CM | POA: Diagnosis not present

## 2013-06-02 DIAGNOSIS — M79609 Pain in unspecified limb: Secondary | ICD-10-CM | POA: Diagnosis not present

## 2013-06-05 DIAGNOSIS — M79609 Pain in unspecified limb: Secondary | ICD-10-CM | POA: Diagnosis not present

## 2013-06-05 DIAGNOSIS — IMO0001 Reserved for inherently not codable concepts without codable children: Secondary | ICD-10-CM | POA: Diagnosis not present

## 2013-06-05 DIAGNOSIS — Z96659 Presence of unspecified artificial knee joint: Secondary | ICD-10-CM | POA: Diagnosis not present

## 2013-06-05 DIAGNOSIS — M6281 Muscle weakness (generalized): Secondary | ICD-10-CM | POA: Diagnosis not present

## 2013-06-05 DIAGNOSIS — R269 Unspecified abnormalities of gait and mobility: Secondary | ICD-10-CM | POA: Diagnosis not present

## 2013-06-06 DIAGNOSIS — N390 Urinary tract infection, site not specified: Secondary | ICD-10-CM | POA: Diagnosis not present

## 2013-06-06 DIAGNOSIS — Z96659 Presence of unspecified artificial knee joint: Secondary | ICD-10-CM | POA: Diagnosis not present

## 2013-06-06 DIAGNOSIS — M25569 Pain in unspecified knee: Secondary | ICD-10-CM | POA: Diagnosis not present

## 2013-06-08 DIAGNOSIS — R269 Unspecified abnormalities of gait and mobility: Secondary | ICD-10-CM | POA: Diagnosis not present

## 2013-06-08 DIAGNOSIS — IMO0001 Reserved for inherently not codable concepts without codable children: Secondary | ICD-10-CM | POA: Diagnosis not present

## 2013-06-08 DIAGNOSIS — M79609 Pain in unspecified limb: Secondary | ICD-10-CM | POA: Diagnosis not present

## 2013-06-08 DIAGNOSIS — M6281 Muscle weakness (generalized): Secondary | ICD-10-CM | POA: Diagnosis not present

## 2013-06-08 DIAGNOSIS — Z96659 Presence of unspecified artificial knee joint: Secondary | ICD-10-CM | POA: Diagnosis not present

## 2013-06-12 DIAGNOSIS — IMO0001 Reserved for inherently not codable concepts without codable children: Secondary | ICD-10-CM | POA: Diagnosis not present

## 2013-06-12 DIAGNOSIS — R269 Unspecified abnormalities of gait and mobility: Secondary | ICD-10-CM | POA: Diagnosis not present

## 2013-06-12 DIAGNOSIS — M79609 Pain in unspecified limb: Secondary | ICD-10-CM | POA: Diagnosis not present

## 2013-06-12 DIAGNOSIS — M6281 Muscle weakness (generalized): Secondary | ICD-10-CM | POA: Diagnosis not present

## 2013-06-12 DIAGNOSIS — Z96659 Presence of unspecified artificial knee joint: Secondary | ICD-10-CM | POA: Diagnosis not present

## 2013-06-14 DIAGNOSIS — M6281 Muscle weakness (generalized): Secondary | ICD-10-CM | POA: Diagnosis not present

## 2013-06-14 DIAGNOSIS — R269 Unspecified abnormalities of gait and mobility: Secondary | ICD-10-CM | POA: Diagnosis not present

## 2013-06-14 DIAGNOSIS — M79609 Pain in unspecified limb: Secondary | ICD-10-CM | POA: Diagnosis not present

## 2013-06-14 DIAGNOSIS — Z96659 Presence of unspecified artificial knee joint: Secondary | ICD-10-CM | POA: Diagnosis not present

## 2013-06-14 DIAGNOSIS — IMO0001 Reserved for inherently not codable concepts without codable children: Secondary | ICD-10-CM | POA: Diagnosis not present

## 2013-06-19 DIAGNOSIS — R269 Unspecified abnormalities of gait and mobility: Secondary | ICD-10-CM | POA: Diagnosis not present

## 2013-06-19 DIAGNOSIS — M79609 Pain in unspecified limb: Secondary | ICD-10-CM | POA: Diagnosis not present

## 2013-06-19 DIAGNOSIS — M6281 Muscle weakness (generalized): Secondary | ICD-10-CM | POA: Diagnosis not present

## 2013-06-19 DIAGNOSIS — IMO0001 Reserved for inherently not codable concepts without codable children: Secondary | ICD-10-CM | POA: Diagnosis not present

## 2013-06-19 DIAGNOSIS — Z96659 Presence of unspecified artificial knee joint: Secondary | ICD-10-CM | POA: Diagnosis not present

## 2013-07-03 DIAGNOSIS — Z96659 Presence of unspecified artificial knee joint: Secondary | ICD-10-CM | POA: Diagnosis not present

## 2013-07-03 DIAGNOSIS — R269 Unspecified abnormalities of gait and mobility: Secondary | ICD-10-CM | POA: Diagnosis not present

## 2013-07-03 DIAGNOSIS — M6281 Muscle weakness (generalized): Secondary | ICD-10-CM | POA: Diagnosis not present

## 2013-07-03 DIAGNOSIS — M79609 Pain in unspecified limb: Secondary | ICD-10-CM | POA: Diagnosis not present

## 2013-07-03 DIAGNOSIS — IMO0001 Reserved for inherently not codable concepts without codable children: Secondary | ICD-10-CM | POA: Diagnosis not present

## 2013-07-05 DIAGNOSIS — Z96659 Presence of unspecified artificial knee joint: Secondary | ICD-10-CM | POA: Diagnosis not present

## 2013-07-05 DIAGNOSIS — R269 Unspecified abnormalities of gait and mobility: Secondary | ICD-10-CM | POA: Diagnosis not present

## 2013-07-05 DIAGNOSIS — M79609 Pain in unspecified limb: Secondary | ICD-10-CM | POA: Diagnosis not present

## 2013-07-05 DIAGNOSIS — M6281 Muscle weakness (generalized): Secondary | ICD-10-CM | POA: Diagnosis not present

## 2013-07-05 DIAGNOSIS — IMO0001 Reserved for inherently not codable concepts without codable children: Secondary | ICD-10-CM | POA: Diagnosis not present

## 2013-07-13 DIAGNOSIS — Z96659 Presence of unspecified artificial knee joint: Secondary | ICD-10-CM | POA: Diagnosis not present

## 2013-07-13 DIAGNOSIS — IMO0002 Reserved for concepts with insufficient information to code with codable children: Secondary | ICD-10-CM | POA: Diagnosis not present

## 2013-07-13 DIAGNOSIS — M1710 Unilateral primary osteoarthritis, unspecified knee: Secondary | ICD-10-CM | POA: Diagnosis not present

## 2013-08-10 ENCOUNTER — Other Ambulatory Visit: Payer: Self-pay | Admitting: Interventional Cardiology

## 2013-08-14 ENCOUNTER — Other Ambulatory Visit: Payer: Self-pay | Admitting: Interventional Cardiology

## 2013-08-16 ENCOUNTER — Other Ambulatory Visit: Payer: Self-pay | Admitting: Interventional Cardiology

## 2013-09-14 DIAGNOSIS — M5020 Other cervical disc displacement, unspecified cervical region: Secondary | ICD-10-CM | POA: Diagnosis not present

## 2013-09-14 DIAGNOSIS — M47817 Spondylosis without myelopathy or radiculopathy, lumbosacral region: Secondary | ICD-10-CM | POA: Diagnosis not present

## 2013-10-02 DIAGNOSIS — E785 Hyperlipidemia, unspecified: Secondary | ICD-10-CM | POA: Diagnosis not present

## 2013-10-02 DIAGNOSIS — I259 Chronic ischemic heart disease, unspecified: Secondary | ICD-10-CM | POA: Diagnosis not present

## 2013-10-02 DIAGNOSIS — I10 Essential (primary) hypertension: Secondary | ICD-10-CM | POA: Diagnosis not present

## 2013-10-02 DIAGNOSIS — E109 Type 1 diabetes mellitus without complications: Secondary | ICD-10-CM | POA: Diagnosis not present

## 2013-10-10 DIAGNOSIS — N35919 Unspecified urethral stricture, male, unspecified site: Secondary | ICD-10-CM | POA: Diagnosis not present

## 2013-10-10 DIAGNOSIS — N390 Urinary tract infection, site not specified: Secondary | ICD-10-CM | POA: Diagnosis not present

## 2013-10-11 DIAGNOSIS — Z96659 Presence of unspecified artificial knee joint: Secondary | ICD-10-CM | POA: Diagnosis not present

## 2013-10-11 DIAGNOSIS — M25569 Pain in unspecified knee: Secondary | ICD-10-CM | POA: Diagnosis not present

## 2013-11-03 IMAGING — CR DG CHEST 2V
2 series · 2 of 2 positions shown · non-contrast
Comparison: 11/13/2010

CLINICAL DATA: Cough

CHEST - 2 VIEW

[view not recorded (1 of 2)]
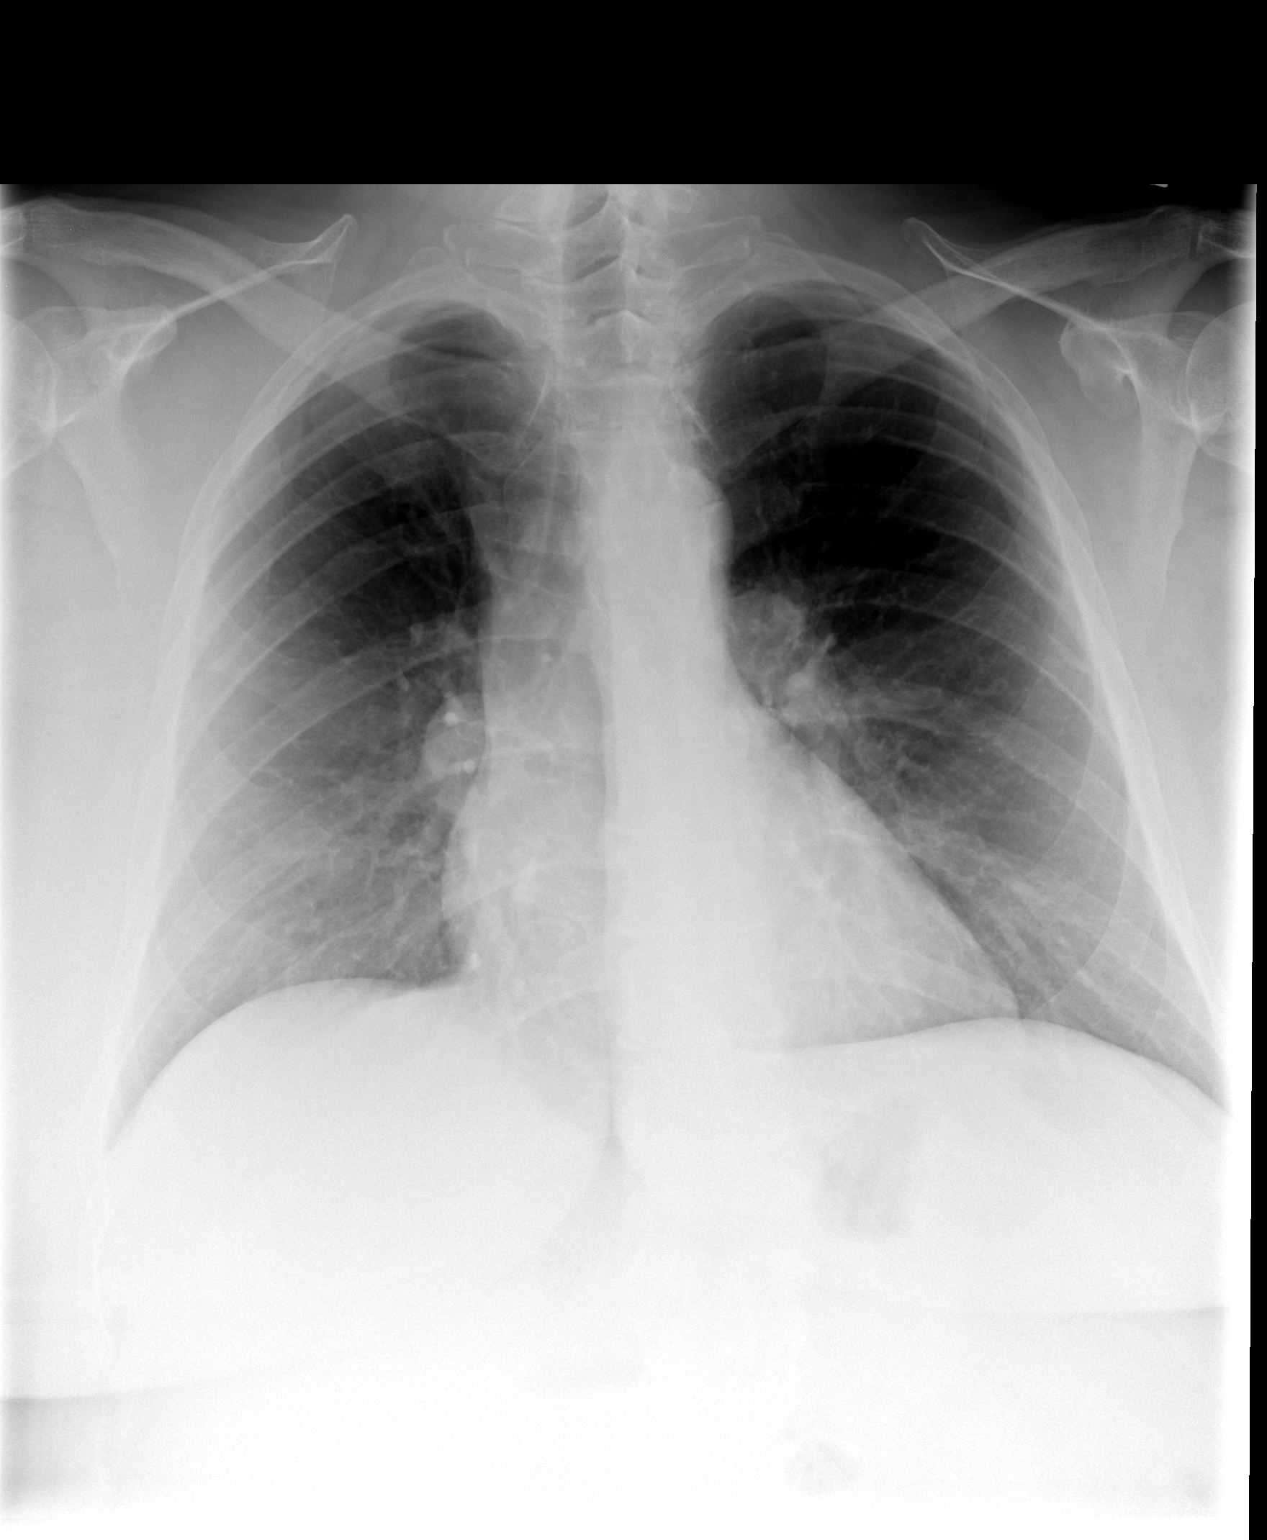

[view not recorded (2 of 2)]
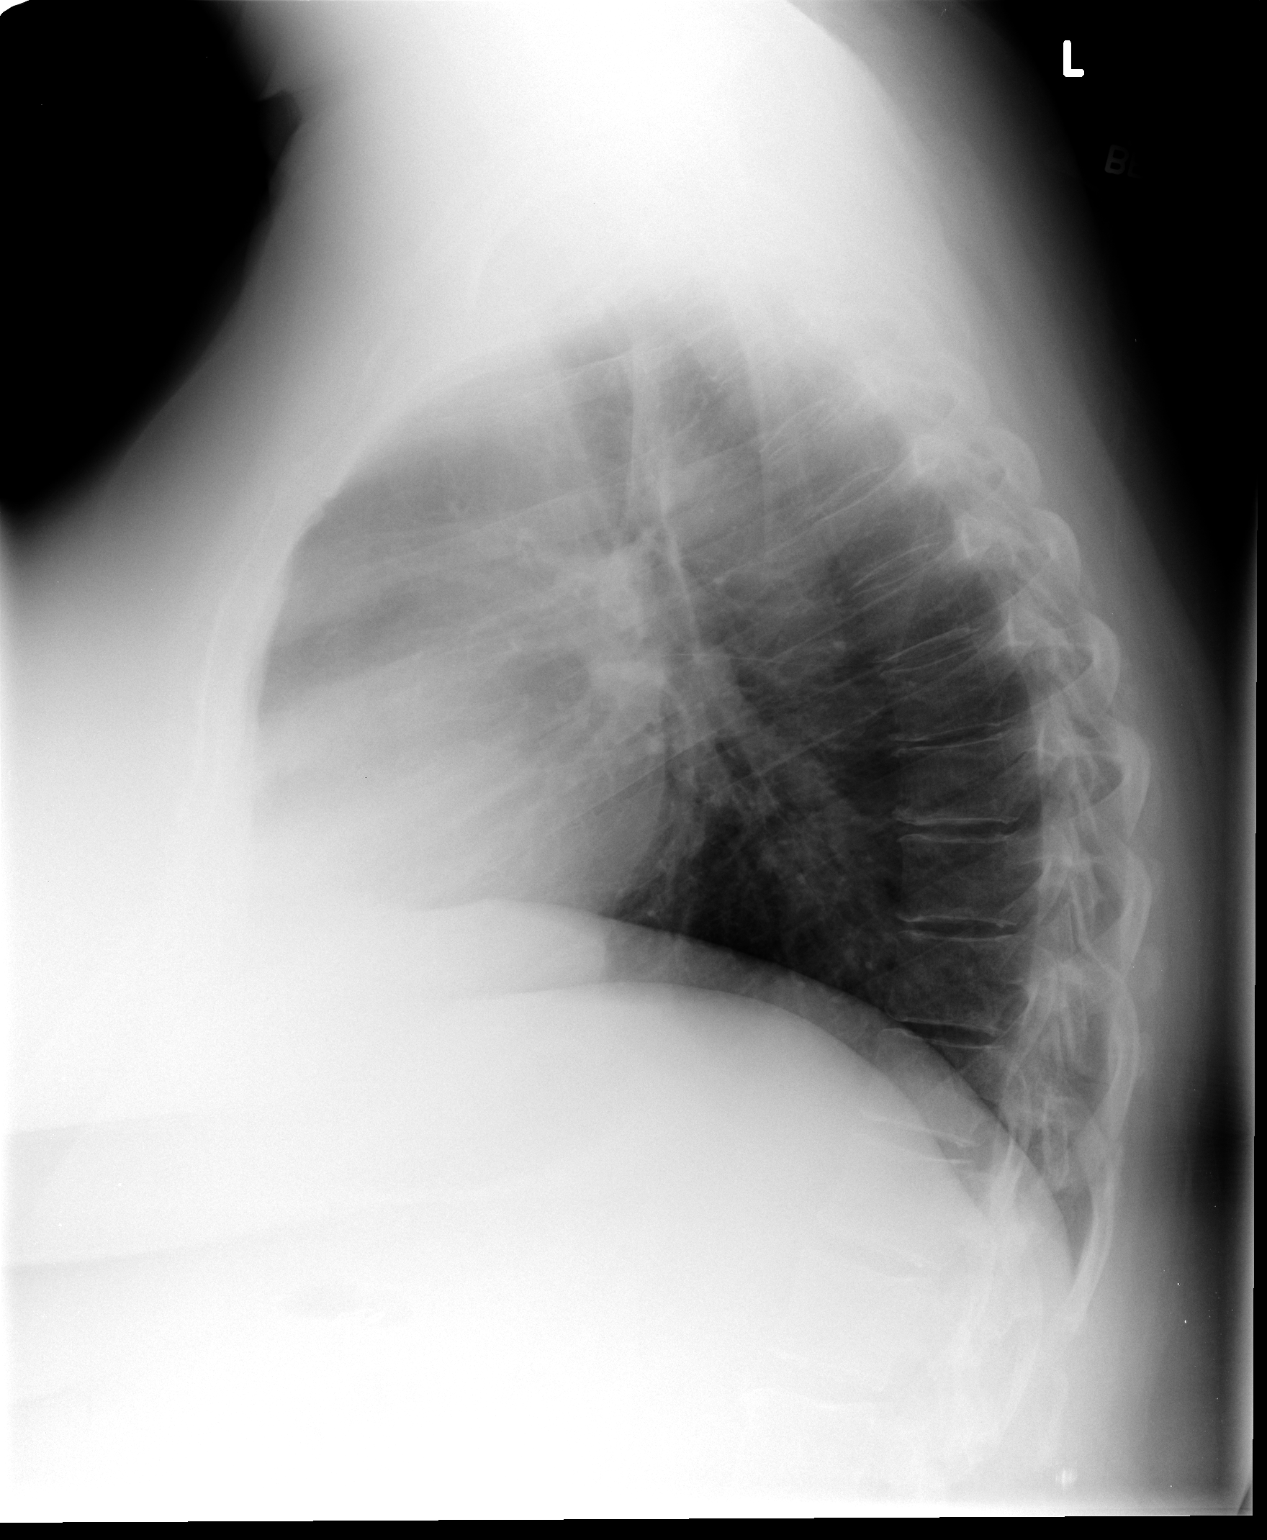

[2 of 2 positions shown; findings below may reference images not displayed]

FINDINGS: Heart size appears normal.

No pleural effusion or pulmonary edema identified.

No airspace consolidation identified.

Review of the visualized osseous structures is unremarkable.
IMPRESSION: 1.  No acute cardiopulmonary abnormalities.]

## 2013-11-10 ENCOUNTER — Other Ambulatory Visit: Payer: Self-pay | Admitting: Interventional Cardiology

## 2013-11-14 ENCOUNTER — Other Ambulatory Visit: Payer: Self-pay | Admitting: Interventional Cardiology

## 2013-12-19 DIAGNOSIS — L920 Granuloma annulare: Secondary | ICD-10-CM | POA: Diagnosis not present

## 2013-12-19 DIAGNOSIS — L821 Other seborrheic keratosis: Secondary | ICD-10-CM | POA: Diagnosis not present

## 2013-12-19 DIAGNOSIS — L218 Other seborrheic dermatitis: Secondary | ICD-10-CM | POA: Diagnosis not present

## 2013-12-25 ENCOUNTER — Ambulatory Visit: Payer: Medicare Other | Admitting: Interventional Cardiology

## 2014-01-02 DIAGNOSIS — I10 Essential (primary) hypertension: Secondary | ICD-10-CM | POA: Diagnosis not present

## 2014-01-02 DIAGNOSIS — Z23 Encounter for immunization: Secondary | ICD-10-CM | POA: Diagnosis not present

## 2014-01-02 DIAGNOSIS — K210 Gastro-esophageal reflux disease with esophagitis: Secondary | ICD-10-CM | POA: Diagnosis not present

## 2014-01-02 DIAGNOSIS — I2510 Atherosclerotic heart disease of native coronary artery without angina pectoris: Secondary | ICD-10-CM | POA: Diagnosis not present

## 2014-01-02 DIAGNOSIS — E119 Type 2 diabetes mellitus without complications: Secondary | ICD-10-CM | POA: Diagnosis not present

## 2014-01-11 ENCOUNTER — Encounter (HOSPITAL_COMMUNITY): Payer: Self-pay | Admitting: Interventional Cardiology

## 2014-02-12 ENCOUNTER — Other Ambulatory Visit: Payer: Self-pay

## 2014-02-12 DIAGNOSIS — I2510 Atherosclerotic heart disease of native coronary artery without angina pectoris: Secondary | ICD-10-CM

## 2014-02-12 MED ORDER — RANOLAZINE ER 500 MG PO TB12: 500.0000 mg | ORAL_TABLET | Freq: Two times a day (BID) | ORAL | Status: DC

## 2014-03-01 ENCOUNTER — Ambulatory Visit: Payer: Medicare Other | Admitting: Interventional Cardiology

## 2014-03-13 DIAGNOSIS — E039 Hypothyroidism, unspecified: Secondary | ICD-10-CM | POA: Diagnosis not present

## 2014-03-13 DIAGNOSIS — E1165 Type 2 diabetes mellitus with hyperglycemia: Secondary | ICD-10-CM | POA: Diagnosis not present

## 2014-03-13 DIAGNOSIS — I10 Essential (primary) hypertension: Secondary | ICD-10-CM | POA: Diagnosis not present

## 2014-03-13 DIAGNOSIS — E780 Pure hypercholesterolemia: Secondary | ICD-10-CM | POA: Diagnosis not present

## 2014-04-02 DIAGNOSIS — R3911 Hesitancy of micturition: Secondary | ICD-10-CM | POA: Diagnosis not present

## 2014-04-02 DIAGNOSIS — R3914 Feeling of incomplete bladder emptying: Secondary | ICD-10-CM | POA: Diagnosis not present

## 2014-04-02 DIAGNOSIS — N390 Urinary tract infection, site not specified: Secondary | ICD-10-CM | POA: Diagnosis not present

## 2014-04-02 DIAGNOSIS — N905 Atrophy of vulva: Secondary | ICD-10-CM | POA: Diagnosis not present

## 2014-04-02 DIAGNOSIS — R312 Other microscopic hematuria: Secondary | ICD-10-CM | POA: Diagnosis not present

## 2014-04-03 DIAGNOSIS — R3914 Feeling of incomplete bladder emptying: Secondary | ICD-10-CM | POA: Diagnosis not present

## 2014-04-03 DIAGNOSIS — R312 Other microscopic hematuria: Secondary | ICD-10-CM | POA: Diagnosis not present

## 2014-04-03 DIAGNOSIS — N390 Urinary tract infection, site not specified: Secondary | ICD-10-CM | POA: Diagnosis not present

## 2014-04-10 DIAGNOSIS — M47816 Spondylosis without myelopathy or radiculopathy, lumbar region: Secondary | ICD-10-CM | POA: Diagnosis not present

## 2014-04-10 DIAGNOSIS — M5126 Other intervertebral disc displacement, lumbar region: Secondary | ICD-10-CM | POA: Diagnosis not present

## 2014-04-13 DIAGNOSIS — Z9049 Acquired absence of other specified parts of digestive tract: Secondary | ICD-10-CM | POA: Diagnosis not present

## 2014-04-13 DIAGNOSIS — N281 Cyst of kidney, acquired: Secondary | ICD-10-CM | POA: Diagnosis not present

## 2014-04-13 DIAGNOSIS — R312 Other microscopic hematuria: Secondary | ICD-10-CM | POA: Diagnosis not present

## 2014-04-13 DIAGNOSIS — Z90710 Acquired absence of both cervix and uterus: Secondary | ICD-10-CM | POA: Diagnosis not present

## 2014-04-13 DIAGNOSIS — R918 Other nonspecific abnormal finding of lung field: Secondary | ICD-10-CM | POA: Diagnosis not present

## 2014-04-17 DIAGNOSIS — R8299 Other abnormal findings in urine: Secondary | ICD-10-CM | POA: Diagnosis not present

## 2014-04-17 DIAGNOSIS — R3911 Hesitancy of micturition: Secondary | ICD-10-CM | POA: Diagnosis not present

## 2014-04-17 DIAGNOSIS — R312 Other microscopic hematuria: Secondary | ICD-10-CM | POA: Diagnosis not present

## 2014-04-17 DIAGNOSIS — R3914 Feeling of incomplete bladder emptying: Secondary | ICD-10-CM | POA: Diagnosis not present

## 2014-04-20 DIAGNOSIS — R311 Benign essential microscopic hematuria: Secondary | ICD-10-CM | POA: Diagnosis not present

## 2014-05-03 ENCOUNTER — Ambulatory Visit: Payer: Medicare Other | Admitting: Interventional Cardiology

## 2014-06-15 DIAGNOSIS — E119 Type 2 diabetes mellitus without complications: Secondary | ICD-10-CM | POA: Diagnosis not present

## 2014-06-15 DIAGNOSIS — H524 Presbyopia: Secondary | ICD-10-CM | POA: Diagnosis not present

## 2014-06-15 DIAGNOSIS — H5213 Myopia, bilateral: Secondary | ICD-10-CM | POA: Diagnosis not present

## 2014-06-15 DIAGNOSIS — H52221 Regular astigmatism, right eye: Secondary | ICD-10-CM | POA: Diagnosis not present

## 2014-06-19 DIAGNOSIS — R312 Other microscopic hematuria: Secondary | ICD-10-CM | POA: Diagnosis not present

## 2014-06-25 DIAGNOSIS — R319 Hematuria, unspecified: Secondary | ICD-10-CM | POA: Diagnosis not present

## 2014-06-28 DIAGNOSIS — I10 Essential (primary) hypertension: Secondary | ICD-10-CM | POA: Diagnosis not present

## 2014-06-28 DIAGNOSIS — G4700 Insomnia, unspecified: Secondary | ICD-10-CM | POA: Diagnosis not present

## 2014-06-28 DIAGNOSIS — E1140 Type 2 diabetes mellitus with diabetic neuropathy, unspecified: Secondary | ICD-10-CM | POA: Diagnosis not present

## 2014-06-28 DIAGNOSIS — I2510 Atherosclerotic heart disease of native coronary artery without angina pectoris: Secondary | ICD-10-CM | POA: Diagnosis not present

## 2014-07-03 ENCOUNTER — Other Ambulatory Visit: Payer: Self-pay | Admitting: Interventional Cardiology

## 2014-07-17 ENCOUNTER — Other Ambulatory Visit (HOSPITAL_COMMUNITY): Payer: Self-pay | Admitting: Respiratory Therapy

## 2014-07-17 DIAGNOSIS — R0602 Shortness of breath: Secondary | ICD-10-CM

## 2014-07-20 ENCOUNTER — Ambulatory Visit (INDEPENDENT_AMBULATORY_CARE_PROVIDER_SITE_OTHER): Payer: Medicare Other | Admitting: Interventional Cardiology

## 2014-07-20 ENCOUNTER — Encounter: Payer: Self-pay | Admitting: Interventional Cardiology

## 2014-07-20 VITALS — BP 138/72 | HR 64 | Ht 64.0 in | Wt 228.8 lb

## 2014-07-20 DIAGNOSIS — R9439 Abnormal result of other cardiovascular function study: Secondary | ICD-10-CM | POA: Diagnosis not present

## 2014-07-20 DIAGNOSIS — E785 Hyperlipidemia, unspecified: Secondary | ICD-10-CM

## 2014-07-20 DIAGNOSIS — I2510 Atherosclerotic heart disease of native coronary artery without angina pectoris: Secondary | ICD-10-CM

## 2014-07-20 DIAGNOSIS — I10 Essential (primary) hypertension: Secondary | ICD-10-CM

## 2014-07-20 NOTE — Patient Instructions (Signed)
Medication Instructions:  Your physician recommends that you continue on your current medications as directed. Please refer to the Current Medication list given to you today.   Labwork: None   Testing/Procedures: None   Follow-Up: Your physician wants you to follow-up in: 1 year with Dr.Smith You will receive a reminder letter in the mail two months in advance. If you don't receive a letter, please call our office to schedule the follow-up appointment.   Any Other Special Instructions Will Be Listed Below (If Applicable). We will request your recent lab results from Dr.Hawkin's office

## 2014-07-20 NOTE — Progress Notes (Signed)
Cardiology Office Note   Date:  07/20/2014   ID:  [name], DOB Jun 15, 1955, MRN [id number]  PCP:  Alonza Bogus, MD  Cardiologist:  Sinclair Grooms, MD   Chief Complaint  Patient presents with  . Chest Pain      History of Present Illness: [name] is a 59 y.o. female who presents for diastolic dysfunction, coronary artery disease with LAD stent, chronically abnormal Cardiolite with apical ischemia, essential hypertension, obesity, and COPD.  The patient has not coming surgical procedure. It will be an operation on her neck. She is concerned that her heart may cause her trouble. She is complaining of aching and cramping in her left arm. This is possibly related to cervical disc disease.  Denies edema. No orthopnea. There is exertional dyspnea. No anginal quality chest pain and she has not used nitroglycerin.    Past Medical History  Diagnosis Date  . Diabetes mellitus   . Hypertension   . Asthma   . Thyroid disease   . Coronary artery disease   . Hypercholesteremia   . Panic attack   . Sleep apnea   . Chest pain   . GERD (gastroesophageal reflux disease)     Past Surgical History  Procedure Laterality Date  . Coronary angioplasty with stent placement    . Cholecystectomy    . Abdominal hysterectomy    . Elbow surgery    . Back surgery    . Knee surgery    . Left heart catheterization with coronary angiogram N/A 06/23/2012    Procedure: LEFT HEART CATHETERIZATION WITH CORONARY ANGIOGRAM;  Surgeon: Sinclair Grooms, MD;  Location: [facility] CATH LAB;  Service: Cardiovascular;  Laterality: N/A;     Current Outpatient Prescriptions  Medication Sig Dispense Refill  . albuterol (PROVENTIL HFA;VENTOLIN HFA) 108 (90 BASE) MCG/ACT inhaler Inhale 2 puffs into the lungs every 4 (four) hours as needed for wheezing.    Marland Kitchen amLODipine (NORVASC) 10 MG tablet TAKE 1 TABLET EVERY DAY 90 tablet 3  . aspirin EC 81 MG tablet Take 81 mg by mouth daily.    .  budesonide-formoterol (SYMBICORT) 160-4.5 MCG/ACT inhaler Inhale 2 puffs into the lungs 2 (two) times daily.    . clopidogrel (PLAVIX) 75 MG tablet TAKE 1 TABLET EVERY DAY 90 tablet 3  . cyclobenzaprine (FLEXERIL) 10 MG tablet Take 10 mg by mouth 3 (three) times daily as needed for muscle spasms.    . diazepam (VALIUM) 5 MG tablet Take 5 mg by mouth daily.    . furosemide (LASIX) 40 MG tablet Take 40 mg by mouth 2 (two) times daily.      Marland Kitchen gabapentin (NEURONTIN) 300 MG capsule Take 300 mg by mouth at bedtime.    Marland Kitchen HYDROcodone-acetaminophen (NORCO) 10-325 MG per tablet Take 1 tablet by mouth every 6 (six) hours as needed for pain.    Marland Kitchen insulin lispro protamine-lispro (HUMALOG 75/25) (75-25) 100 UNIT/ML SUSP Inject 70-75 Units into the skin 2 (two) times daily with a meal. Takes 75 units in am and 70 units in pm    . isosorbide mononitrate (IMDUR) 60 MG 24 hr tablet Take 1 tablet (60 mg total) by mouth daily. 90 tablet 0  . levothyroxine (SYNTHROID, LEVOTHROID) 200 MCG tablet Take 200 mcg by mouth daily before breakfast.    . losartan (COZAAR) 100 MG tablet Take 100 mg by mouth daily.    . metformin (FORTAMET) 500 MG (OSM) 24 hr tablet Take by mouth  2 (two) times daily with a meal.      . metoprolol (TOPROL-XL) 200 MG 24 hr tablet Take 200 mg by mouth daily.      . mirtazapine (REMERON) 30 MG tablet Take 30 mg by mouth at bedtime.    . nitroGLYCERIN (NITROSTAT) 0.4 MG SL tablet Place 1 tablet (0.4 mg total) under the tongue every 5 (five) minutes as needed for chest pain. 25 tablet 3  . omeprazole (PRILOSEC) 20 MG capsule Take 20 mg by mouth daily.    . potassium chloride SA (K-DUR,KLOR-CON) 20 MEQ tablet Take 40 mEq by mouth daily.    Marland Kitchen RANEXA 500 MG 12 hr tablet TAKE 1 TABLET (500 MG TOTAL) BY MOUTH 2 (TWO) TIMES DAILY. 60 tablet 0  . rosuvastatin (CRESTOR) 40 MG tablet Take 40 mg by mouth daily.      Marland Kitchen tiotropium (SPIRIVA) 18 MCG inhalation capsule Place 18 mcg into inhaler and inhale daily.      No current facility-administered medications for this visit.    Allergies:   Tape    Social History:  The patient  reports that she has never smoked. She does not have any smokeless tobacco history on file. She reports that she does not drink alcohol or use illicit drugs.   Family History:  The patient's family history includes COPD in her mother; Heart attack in her father; Heart disease in her father.    ROS:  Please see the history of present illness.   Otherwise, review of systems are positive for leg pain, fatigue, blood in urine, back discomfort, muscle cramping in left arm and left back, easy bruising, and headaches..   All other systems are reviewed and negative.    PHYSICAL EXAM: VS:  BP 138/72 mmHg  Pulse 64  Ht 5\' 4"  (1.626 m)  Wt 103.783 kg (228 lb 12.8 oz)  BMI 39.25 kg/m2 , BMI Body mass index is 39.25 kg/(m^2). GEN: Well nourished, well developed, in no acute distressPeriod smells heavily of cigarette smoke. HEENT: normal Neck: no JVD, carotid bruits, or masses Cardiac: RRR; no murmurs, rubs, or gallops,no edema  Respiratory:  clear to auscultation bilaterally, normal work of breathing GI: soft, nontender, nondistended, + BS MS: no deformity or atrophy Skin: warm and dry, no rash Neuro:  Strength and sensation are intact Psych: euthymic mood, full affect   EKG:  EKG is ordered today. The ekg ordered today demonstrates normal sinus rhythm with right bundle and left anterior hemiblock. EKG is unchanged from prior tracings. The right bundle with with anterior hemiblock is new.   Recent Labs: No results found for requested labs within last 365 days.    Lipid Panel No results found for: CHOL, TRIG, HDL, CHOLHDL, VLDL, LDLCALC, LDLDIRECT    Wt Readings from Last 3 Encounters:  07/20/14 103.783 kg (228 lb 12.8 oz)  01/02/13 107.956 kg (238 lb)  06/23/12 105.688 kg (233 lb)      Other studies Reviewed: Additional studies/ records that were reviewed today  include: . Review of the above records demonstrates:    ASSESSMENT AND PLAN:  Coronary arteriosclerosis in native artery-  recent symptoms are atypical for angina.  Essential hypertension -controlled  Hyperlipidemia - recent laboratory data by Dr. Luan Pulling is not available  Abnormal cardiovascular stress test  Preoperative CV exam - patient is cleared for upcoming cervical surgery. Cardiovascular symptoms have not changed significantly. No further evaluation is needed at this time.   Current medicines are reviewed at length with the  patient today.  The patient does not have concerns regarding medicines.  The following changes have been made:  no change  The patient is clear for upcoming general anesthesia needed with relation to cervical disc disease.  Labs/ tests ordered today include:   Orders Placed This Encounter  Procedures  . EKG 12-Lead     Disposition:   FU with HS in 8 months  Signed, Sinclair Grooms, MD  07/20/2014 4:34 PM    [facility] [address], [place], [place]  [address] Phone: [phone]; Fax: [phone]

## 2014-08-01 ENCOUNTER — Ambulatory Visit (HOSPITAL_COMMUNITY): Admission: RE | Admit: 2014-08-01 | Payer: Medicare Other | Source: Ambulatory Visit

## 2014-08-02 DIAGNOSIS — F419 Anxiety disorder, unspecified: Secondary | ICD-10-CM | POA: Diagnosis not present

## 2014-08-02 DIAGNOSIS — E119 Type 2 diabetes mellitus without complications: Secondary | ICD-10-CM | POA: Diagnosis not present

## 2014-08-02 DIAGNOSIS — G8929 Other chronic pain: Secondary | ICD-10-CM | POA: Diagnosis not present

## 2014-08-02 DIAGNOSIS — K219 Gastro-esophageal reflux disease without esophagitis: Secondary | ICD-10-CM | POA: Diagnosis not present

## 2014-08-02 DIAGNOSIS — I252 Old myocardial infarction: Secondary | ICD-10-CM | POA: Diagnosis not present

## 2014-08-02 DIAGNOSIS — G629 Polyneuropathy, unspecified: Secondary | ICD-10-CM | POA: Diagnosis not present

## 2014-08-02 DIAGNOSIS — Z794 Long term (current) use of insulin: Secondary | ICD-10-CM | POA: Diagnosis not present

## 2014-08-02 DIAGNOSIS — E039 Hypothyroidism, unspecified: Secondary | ICD-10-CM | POA: Diagnosis not present

## 2014-08-02 DIAGNOSIS — I10 Essential (primary) hypertension: Secondary | ICD-10-CM | POA: Diagnosis not present

## 2014-08-02 DIAGNOSIS — G4730 Sleep apnea, unspecified: Secondary | ICD-10-CM | POA: Diagnosis not present

## 2014-08-02 DIAGNOSIS — E780 Pure hypercholesterolemia: Secondary | ICD-10-CM | POA: Diagnosis not present

## 2014-08-02 DIAGNOSIS — Z8673 Personal history of transient ischemic attack (TIA), and cerebral infarction without residual deficits: Secondary | ICD-10-CM | POA: Diagnosis not present

## 2014-08-02 DIAGNOSIS — R079 Chest pain, unspecified: Secondary | ICD-10-CM | POA: Diagnosis not present

## 2014-08-02 DIAGNOSIS — R0602 Shortness of breath: Secondary | ICD-10-CM | POA: Diagnosis not present

## 2014-08-02 DIAGNOSIS — Z955 Presence of coronary angioplasty implant and graft: Secondary | ICD-10-CM | POA: Diagnosis not present

## 2014-08-03 DIAGNOSIS — R079 Chest pain, unspecified: Secondary | ICD-10-CM | POA: Diagnosis not present

## 2014-08-09 ENCOUNTER — Ambulatory Visit: Payer: Medicare Other | Attending: Pulmonary Disease | Admitting: Sleep Medicine

## 2014-08-09 DIAGNOSIS — G4733 Obstructive sleep apnea (adult) (pediatric): Secondary | ICD-10-CM | POA: Insufficient documentation

## 2014-08-09 DIAGNOSIS — G4730 Sleep apnea, unspecified: Secondary | ICD-10-CM | POA: Diagnosis not present

## 2014-08-09 DIAGNOSIS — R0602 Shortness of breath: Secondary | ICD-10-CM

## 2014-08-15 ENCOUNTER — Ambulatory Visit (HOSPITAL_COMMUNITY)
Admission: RE | Admit: 2014-08-15 | Discharge: 2014-08-15 | Disposition: A | Discharge status: Home / Self Care | Payer: Medicare Other | Source: Ambulatory Visit | Attending: Pulmonary Disease | Admitting: Pulmonary Disease

## 2014-08-15 DIAGNOSIS — R0602 Shortness of breath: Secondary | ICD-10-CM | POA: Insufficient documentation

## 2014-08-15 LAB — PULMONARY FUNCTION TEST
DL/VA % pred: 115 %
DL/VA: 5.39 ml/min/mmHg/L
DLCO unc % pred: 76 %
DLCO unc: 17.57 ml/min/mmHg
FEF 25-75 Post: 2.79 L/sec
FEF 25-75 Pre: 1.77 L/sec
FEF2575-%Change-Post: 57 %
FEF2575-%Pred-Post: 120 %
FEF2575-%Pred-Pre: 76 %
FEV1-%Change-Post: 11 %
FEV1-%Pred-Post: 84 %
FEV1-%Pred-Pre: 75 %
FEV1-Post: 2.09 L
FEV1-Pre: 1.87 L
FEV1FVC-%Change-Post: 2 %
FEV1FVC-%Pred-Pre: 101 %
FEV6-%Change-Post: 8 %
FEV6-%Pred-Post: 82 %
FEV6-%Pred-Pre: 76 %
FEV6-Post: 2.56 L
FEV6-Pre: 2.36 L
FEV6FVC-%Pred-Post: 103 %
FEV6FVC-%Pred-Pre: 103 %
FVC-%Change-Post: 8 %
FVC-%Pred-Post: 80 %
FVC-%Pred-Pre: 73 %
FVC-Post: 2.56 L
FVC-Pre: 2.36 L
Post FEV1/FVC ratio: 81 %
Post FEV6/FVC ratio: 100 %
Pre FEV1/FVC ratio: 79 %
Pre FEV6/FVC Ratio: 100 %
RV % pred: 106 %
RV: 2.05 L
TLC % pred: 82 %
TLC: 4.05 L

## 2014-08-15 MED ORDER — ALBUTEROL SULFATE (2.5 MG/3ML) 0.083% IN NEBU
2.5000 mg | INHALATION_SOLUTION | Freq: Once | RESPIRATORY_TRACT | Status: AC
  Administered 2014-08-15: 2.5 mg via RESPIRATORY_TRACT

## 2014-08-23 NOTE — Sleep Study (Signed)
  [place] A. Merlene Laughter, MD     [email or web address]             NOCTURNAL POLYSOMNOGRAPHY   LOCATION: [facility]    Demographics Patient Name: Margia, Wiesen Date: 08/09/2014 Gender: Female D.O.B: Jul 08, 1955 Age (years): 59 Referring Provider: Lennox Solders Height (inches): 63 Interpreting Physician: Phillips Odor MD, ABSM Weight (lbs): 220 RPSGT: Mort Sawyers BMI: 39 MRN: [id number] Neck Size: 17.00    CLINICAL INFORMATION  Sleep Study Type: NPSG Indication for sleep study: N/A Epworth Sleepiness Score:  SLEEP STUDY TECHNIQUE  As per the AASM Manual for the Scoring of Sleep and Associated Events v2.3 (April 2016) with a hypopnea requiring 4% desaturations. The channels recorded and monitored were frontal, central and occipital EEG, electrooculogram (EOG), submentalis EMG (chin), nasal and oral airflow, thoracic and abdominal wall motion, anterior tibialis EMG, snore microphone, electrocardiogram, and pulse oximetry.  MEDICATIONS  Patient's medications include: N/A. Medications self-administered by patient during sleep study : No sleep medicine administered.  SLEEP ARCHITECTURE The study was initiated at 9:50:18 PM and ended at 4:53:24 AM. Sleep onset time was 29.1 minutes and the sleep efficiency was 67.0%. The total sleep time was 283.5 minutes. Stage REM latency was 135.0 minutes. The patient spent 4.59% of the night in stage N1 sleep, 64.20% in stage N2 sleep, 0.00% in stage N3 and 31.22% in REM. Alpha intrusion was absent. Supine sleep was 17.11%.  RESPIRATORY PARAMETERS  The overall apnea/hypopnea index (AHI) was 7.6 per hour. There were 4 total apneas, including 4 obstructive, 0 central and 0 mixed apneas. There were 32 hypopneas and 0 RERAs. The AHI during Stage REM sleep was 14.2 per hour. AHI while supine was 32.2 per hour. The mean oxygen saturation was 91.28%. The minimum SpO2 during sleep was 85.00%. Moderate snoring was noted  during this study.  CARDIAC DATA  The 2 lead EKG demonstrated sinus rhythm. The mean heart rate was N/A beats per minute. Other EKG findings include: None.  LEG MOVEMENT DATA  The total PLMS were 1 with a resulting PLMS index of 0.21. Associated arousal with leg movement index was 0.0 .    IMPRESSIONS Mild obstructive sleep apnea that does not require positive pressure treatment.  No significant central sleep apnea occurred during this study. Mild oxygen desaturation was noted during this study. The patient snored with Moderate snoring volume. No cardiac abnormalities were noted during this study.      Delano Metz, MD Diplomate, American Board of Sleep Medicine.

## 2014-09-03 ENCOUNTER — Other Ambulatory Visit: Payer: Self-pay | Admitting: Interventional Cardiology

## 2014-10-11 DIAGNOSIS — M5126 Other intervertebral disc displacement, lumbar region: Secondary | ICD-10-CM | POA: Diagnosis not present

## 2014-10-11 DIAGNOSIS — M47816 Spondylosis without myelopathy or radiculopathy, lumbar region: Secondary | ICD-10-CM | POA: Diagnosis not present

## 2014-10-11 DIAGNOSIS — M4712 Other spondylosis with myelopathy, cervical region: Secondary | ICD-10-CM | POA: Diagnosis not present

## 2014-10-15 DIAGNOSIS — M150 Primary generalized (osteo)arthritis: Secondary | ICD-10-CM | POA: Diagnosis not present

## 2014-10-15 DIAGNOSIS — I2510 Atherosclerotic heart disease of native coronary artery without angina pectoris: Secondary | ICD-10-CM | POA: Diagnosis not present

## 2014-10-15 DIAGNOSIS — E119 Type 2 diabetes mellitus without complications: Secondary | ICD-10-CM | POA: Diagnosis not present

## 2014-10-15 DIAGNOSIS — J45909 Unspecified asthma, uncomplicated: Secondary | ICD-10-CM | POA: Diagnosis not present

## 2014-10-25 DIAGNOSIS — I10 Essential (primary) hypertension: Secondary | ICD-10-CM | POA: Diagnosis not present

## 2014-10-25 DIAGNOSIS — E780 Pure hypercholesterolemia: Secondary | ICD-10-CM | POA: Diagnosis not present

## 2014-10-25 DIAGNOSIS — E039 Hypothyroidism, unspecified: Secondary | ICD-10-CM | POA: Diagnosis not present

## 2014-10-25 DIAGNOSIS — E1165 Type 2 diabetes mellitus with hyperglycemia: Secondary | ICD-10-CM | POA: Diagnosis not present

## 2014-10-26 ENCOUNTER — Other Ambulatory Visit: Payer: Self-pay | Admitting: Endocrinology

## 2014-10-26 DIAGNOSIS — Z808 Family history of malignant neoplasm of other organs or systems: Secondary | ICD-10-CM

## 2014-11-16 ENCOUNTER — Other Ambulatory Visit: Payer: Self-pay | Admitting: Interventional Cardiology

## 2014-11-21 ENCOUNTER — Other Ambulatory Visit: Payer: Medicare Other

## 2014-12-06 DIAGNOSIS — D1801 Hemangioma of skin and subcutaneous tissue: Secondary | ICD-10-CM | POA: Diagnosis not present

## 2014-12-06 DIAGNOSIS — D225 Melanocytic nevi of trunk: Secondary | ICD-10-CM | POA: Diagnosis not present

## 2014-12-06 DIAGNOSIS — D2262 Melanocytic nevi of left upper limb, including shoulder: Secondary | ICD-10-CM | POA: Diagnosis not present

## 2014-12-06 DIAGNOSIS — L738 Other specified follicular disorders: Secondary | ICD-10-CM | POA: Diagnosis not present

## 2014-12-06 DIAGNOSIS — L820 Inflamed seborrheic keratosis: Secondary | ICD-10-CM | POA: Diagnosis not present

## 2014-12-06 DIAGNOSIS — D0362 Melanoma in situ of left upper limb, including shoulder: Secondary | ICD-10-CM | POA: Diagnosis not present

## 2014-12-06 DIAGNOSIS — L821 Other seborrheic keratosis: Secondary | ICD-10-CM | POA: Diagnosis not present

## 2014-12-06 DIAGNOSIS — L814 Other melanin hyperpigmentation: Secondary | ICD-10-CM | POA: Diagnosis not present

## 2014-12-12 ENCOUNTER — Other Ambulatory Visit: Payer: Medicare Other

## 2014-12-13 ENCOUNTER — Other Ambulatory Visit: Payer: Medicare Other

## 2014-12-21 ENCOUNTER — Other Ambulatory Visit: Payer: Medicare Other

## 2014-12-24 DIAGNOSIS — N905 Atrophy of vulva: Secondary | ICD-10-CM | POA: Diagnosis not present

## 2014-12-24 DIAGNOSIS — R828 Abnormal findings on cytological and histological examination of urine: Secondary | ICD-10-CM | POA: Diagnosis not present

## 2014-12-24 DIAGNOSIS — R3129 Other microscopic hematuria: Secondary | ICD-10-CM | POA: Diagnosis not present

## 2014-12-25 ENCOUNTER — Other Ambulatory Visit: Payer: Self-pay | Admitting: Interventional Cardiology

## 2014-12-25 DIAGNOSIS — R828 Abnormal findings on cytological and histological examination of urine: Secondary | ICD-10-CM | POA: Diagnosis not present

## 2014-12-25 DIAGNOSIS — R3129 Other microscopic hematuria: Secondary | ICD-10-CM | POA: Diagnosis not present

## 2015-01-10 ENCOUNTER — Ambulatory Visit
Admission: RE | Admit: 2015-01-10 | Discharge: 2015-01-10 | Disposition: A | Discharge status: Home / Self Care | Payer: Medicare Other | Source: Ambulatory Visit | Attending: Endocrinology | Admitting: Endocrinology

## 2015-01-10 DIAGNOSIS — Z808 Family history of malignant neoplasm of other organs or systems: Secondary | ICD-10-CM

## 2015-01-14 DIAGNOSIS — I2510 Atherosclerotic heart disease of native coronary artery without angina pectoris: Secondary | ICD-10-CM | POA: Diagnosis not present

## 2015-01-14 DIAGNOSIS — E1140 Type 2 diabetes mellitus with diabetic neuropathy, unspecified: Secondary | ICD-10-CM | POA: Diagnosis not present

## 2015-01-14 DIAGNOSIS — G4730 Sleep apnea, unspecified: Secondary | ICD-10-CM | POA: Diagnosis not present

## 2015-01-14 DIAGNOSIS — I10 Essential (primary) hypertension: Secondary | ICD-10-CM | POA: Diagnosis not present

## 2015-01-15 ENCOUNTER — Other Ambulatory Visit (HOSPITAL_COMMUNITY): Payer: Self-pay | Admitting: Respiratory Therapy

## 2015-01-15 DIAGNOSIS — R0683 Snoring: Secondary | ICD-10-CM

## 2015-01-15 DIAGNOSIS — D0362 Melanoma in situ of left upper limb, including shoulder: Secondary | ICD-10-CM | POA: Diagnosis not present

## 2015-01-19 ENCOUNTER — Other Ambulatory Visit: Payer: Self-pay | Admitting: Interventional Cardiology

## 2015-01-30 ENCOUNTER — Ambulatory Visit: Payer: Medicare Other | Admitting: Podiatry

## 2015-02-19 DIAGNOSIS — M50223 Other cervical disc displacement at C6-C7 level: Secondary | ICD-10-CM | POA: Diagnosis not present

## 2015-02-19 DIAGNOSIS — M50323 Other cervical disc degeneration at C6-C7 level: Secondary | ICD-10-CM | POA: Diagnosis not present

## 2015-02-19 DIAGNOSIS — M4802 Spinal stenosis, cervical region: Secondary | ICD-10-CM | POA: Diagnosis not present

## 2015-02-19 DIAGNOSIS — M9981 Other biomechanical lesions of cervical region: Secondary | ICD-10-CM | POA: Diagnosis not present

## 2015-02-27 ENCOUNTER — Ambulatory Visit: Payer: Medicare Other | Admitting: Podiatry

## 2015-02-28 ENCOUNTER — Other Ambulatory Visit: Payer: Self-pay | Admitting: Interventional Cardiology

## 2015-03-18 ENCOUNTER — Other Ambulatory Visit: Payer: Self-pay | Admitting: Interventional Cardiology

## 2015-03-19 ENCOUNTER — Ambulatory Visit: Payer: Medicare Other | Admitting: Podiatry

## 2015-04-10 ENCOUNTER — Ambulatory Visit: Payer: Medicare Other | Admitting: Podiatry

## 2015-04-18 ENCOUNTER — Ambulatory Visit: Payer: Medicare Other | Attending: Pulmonary Disease | Admitting: Sleep Medicine

## 2015-04-18 DIAGNOSIS — Z79899 Other long term (current) drug therapy: Secondary | ICD-10-CM | POA: Insufficient documentation

## 2015-04-18 DIAGNOSIS — G4733 Obstructive sleep apnea (adult) (pediatric): Secondary | ICD-10-CM | POA: Insufficient documentation

## 2015-04-18 DIAGNOSIS — R0683 Snoring: Secondary | ICD-10-CM

## 2015-04-18 DIAGNOSIS — G4730 Sleep apnea, unspecified: Secondary | ICD-10-CM | POA: Diagnosis present

## 2015-04-29 DIAGNOSIS — E039 Hypothyroidism, unspecified: Secondary | ICD-10-CM | POA: Diagnosis not present

## 2015-04-29 DIAGNOSIS — E7800 Pure hypercholesterolemia, unspecified: Secondary | ICD-10-CM | POA: Diagnosis not present

## 2015-04-29 DIAGNOSIS — I10 Essential (primary) hypertension: Secondary | ICD-10-CM | POA: Diagnosis not present

## 2015-04-29 DIAGNOSIS — E1165 Type 2 diabetes mellitus with hyperglycemia: Secondary | ICD-10-CM | POA: Diagnosis not present

## 2015-04-30 NOTE — Sleep Study (Signed)
[place] A. Merlene Laughter, MD     [email or web address]             NOCTURNAL POLYSOMNOGRAPHY   LOCATION: [facility]  Patient Name: [name], [name] Date: 04/18/2015 Gender: Female D.O.B: 15-Dec-1955 Age (years): 59 Referring Provider: Lennox Solders Height (inches): 63 Interpreting Physician: Phillips Odor MD, ABSM Weight (lbs): 220 RPSGT: Peak, Robert BMI: 39 MRN: [id number] Neck Size: 17.00 CLINICAL INFORMATION Sleep Study Type: Split Night CPAP Indication for sleep study: Snoring Epworth Sleepiness Score: 9 SLEEP STUDY TECHNIQUE As per the AASM Manual for the Scoring of Sleep and Associated Events v2.3 (April 2016) with a hypopnea requiring 4% desaturations. The channels recorded and monitored were frontal, central and occipital EEG, electrooculogram (EOG), submentalis EMG (chin), nasal and oral airflow, thoracic and abdominal wall motion, anterior tibialis EMG, snore microphone, electrocardiogram, and pulse oximetry. Continuous positive airway pressure (CPAP) was initiated when the patient met split night criteria and was titrated according to treat sleep-disordered breathing. MEDICATIONS Medications taken by the patient : N/A Medications administered by patient during sleep study : No sleep medicine administered.  Current outpatient prescriptions:  .  albuterol (PROVENTIL HFA;VENTOLIN HFA) 108 (90 BASE) MCG/ACT inhaler, Inhale 2 puffs into the lungs every 4 (four) hours as needed for wheezing., Disp: , Rfl:  .  amLODipine (NORVASC) 10 MG tablet, Take 1 tablet (10 mg total) by mouth daily., Disp: 90 tablet, Rfl: 1 .  amLODipine (NORVASC) 10 MG tablet, Take 1 tablet (10 mg total) by mouth daily., Disp: 90 tablet, Rfl: 1 .  aspirin EC 81 MG tablet, Take 81 mg by mouth daily., Disp: , Rfl:  .  budesonide-formoterol (SYMBICORT) 160-4.5 MCG/ACT inhaler, Inhale 2 puffs into the lungs 2 (two) times daily., Disp: , Rfl:  .  clopidogrel (PLAVIX) 75 MG tablet,  Take 1 tablet (75 mg total) by mouth daily., Disp: 90 tablet, Rfl: 2 .  cyclobenzaprine (FLEXERIL) 10 MG tablet, Take 10 mg by mouth 3 (three) times daily as needed for muscle spasms., Disp: , Rfl:  .  diazepam (VALIUM) 5 MG tablet, Take 5 mg by mouth daily., Disp: , Rfl:  .  furosemide (LASIX) 40 MG tablet, Take 40 mg by mouth 2 (two) times daily.  , Disp: , Rfl:  .  gabapentin (NEURONTIN) 300 MG capsule, Take 300 mg by mouth at bedtime., Disp: , Rfl:  .  HYDROcodone-acetaminophen (NORCO) 10-325 MG per tablet, Take 1 tablet by mouth every 6 (six) hours as needed for pain., Disp: , Rfl:  .  insulin lispro protamine-lispro (HUMALOG 75/25) (75-25) 100 UNIT/ML SUSP, Inject 70-75 Units into the skin 2 (two) times daily with a meal. Takes 75 units in am and 70 units in pm, Disp: , Rfl:  .  isosorbide mononitrate (IMDUR) 60 MG 24 hr tablet, Take 1 tablet (60 mg total) by mouth daily., Disp: 90 tablet, Rfl: 0 .  levothyroxine (SYNTHROID, LEVOTHROID) 200 MCG tablet, Take 200 mcg by mouth daily before breakfast., Disp: , Rfl:  .  losartan (COZAAR) 100 MG tablet, Take 100 mg by mouth daily., Disp: , Rfl:  .  metformin (FORTAMET) 500 MG (OSM) 24 hr tablet, Take by mouth 2 (two) times daily with a meal.  , Disp: , Rfl:  .  metoprolol (TOPROL-XL) 200 MG 24 hr tablet, Take 200 mg by mouth daily.  , Disp: , Rfl:  .  mirtazapine (REMERON) 30 MG tablet, Take 30 mg by mouth at bedtime., Disp: , Rfl:  .  NITROSTAT 0.4 MG  SL tablet, PLACE 1 TABLET (0.4 MG TOTAL) UNDER THE TONGUE EVERY 5 (FIVE) MINUTES AS NEEDED FOR CHEST PAIN., Disp: 25 tablet, Rfl: 2 .  omeprazole (PRILOSEC) 20 MG capsule, Take 20 mg by mouth daily., Disp: , Rfl:  .  potassium chloride SA (K-DUR,KLOR-CON) 20 MEQ tablet, Take 40 mEq by mouth daily., Disp: , Rfl:  .  ranolazine (RANEXA) 500 MG 12 hr tablet, Take 1 tablet (500 mg total) by mouth 2 (two) times daily., Disp: 60 tablet, Rfl: 10 .  rosuvastatin (CRESTOR) 40 MG tablet, Take 40 mg by mouth  daily.  , Disp: , Rfl:  .  tiotropium (SPIRIVA) 18 MCG inhalation capsule, Place 18 mcg into inhaler and inhale daily., Disp: , Rfl:   RESPIRATORY PARAMETERS Diagnostic Total AHI (/hr): 72.6 RDI (/hr): 72.6 OA Index (/hr): 3.5 CA Index (/hr): 0.4 REM AHI (/hr): N/A NREM AHI (/hr): 72.6 Supine AHI (/hr): N/A Non-supine AHI (/hr): 74.26 Min O2 Sat (%): 83.00 Mean O2 (%): 90.44 Time below 88% (min): 14.5   Titration Optimal Pressure (cm): 9 AHI at Optimal Pressure (/hr): 0.6 Min O2 at Optimal Pressure (%): 84.0 Supine % at Optimal (%): 0 Sleep % at Optimal (%): 84   SLEEP ARCHITECTURE The recording time for the entire night was 451.1 minutes. During a baseline period of 154.4 minutes, the patient slept for 135.5 minutes in REM and nonREM, yielding a sleep efficiency of 87.7%. Sleep onset after lights out was 6.7 minutes with a REM latency of N/A minutes. The patient spent 11.81% of the night in stage N1 sleep, 88.19% in stage N2 sleep, 0.00% in stage N3 and 0.00% in REM. During the titration period of 289.5 minutes, the patient slept for 237.4 minutes in REM and nonREM, yielding a sleep efficiency of 82.0%. Sleep onset after CPAP initiation was 8.6 minutes with a REM latency of 215.5 minutes. The patient spent 3.16% of the night in stage N1 sleep, 84.41% in stage N2 sleep, 0.00% in stage N3 and 12.43% in REM. CARDIAC DATA The 2 lead EKG demonstrated sinus rhythm. The mean heart rate was 73.21 beats per minute. Other EKG findings include: None. LEG MOVEMENT DATA The total Periodic Limb Movements of Sleep (PLMS) were 0. The PLMS index was 0.00.   IMPRESSIONS - Severe obstructive sleep apnea occurred during the diagnostic portion of the study (AHI = 72.6/hour). An optimal PAP pressure was selected for this patient ( 9 cm of water).   Delano Metz, MD Diplomate, American Board of Sleep Medicine.

## 2015-05-01 DIAGNOSIS — M4712 Other spondylosis with myelopathy, cervical region: Secondary | ICD-10-CM | POA: Diagnosis not present

## 2015-05-01 DIAGNOSIS — M47816 Spondylosis without myelopathy or radiculopathy, lumbar region: Secondary | ICD-10-CM | POA: Diagnosis not present

## 2015-05-01 DIAGNOSIS — M5126 Other intervertebral disc displacement, lumbar region: Secondary | ICD-10-CM | POA: Diagnosis not present

## 2015-05-09 DIAGNOSIS — I2510 Atherosclerotic heart disease of native coronary artery without angina pectoris: Secondary | ICD-10-CM | POA: Diagnosis not present

## 2015-05-09 DIAGNOSIS — E1140 Type 2 diabetes mellitus with diabetic neuropathy, unspecified: Secondary | ICD-10-CM | POA: Diagnosis not present

## 2015-05-09 DIAGNOSIS — I10 Essential (primary) hypertension: Secondary | ICD-10-CM | POA: Diagnosis not present

## 2015-05-09 DIAGNOSIS — G4730 Sleep apnea, unspecified: Secondary | ICD-10-CM | POA: Diagnosis not present

## 2015-05-17 IMAGING — US US ABDOMEN COMPLETE
1 series · 14 of 25 positions shown · non-contrast
Comparison: None.

CLINICAL DATA: Abdominal pain.  Prior cholecystectomy.

ABDOMINAL ULTRASOUND COMPLETE

[Series 1: us abdomen complete · 0.26mm/px · 14 of 84 slices shown]
[im 1/84]
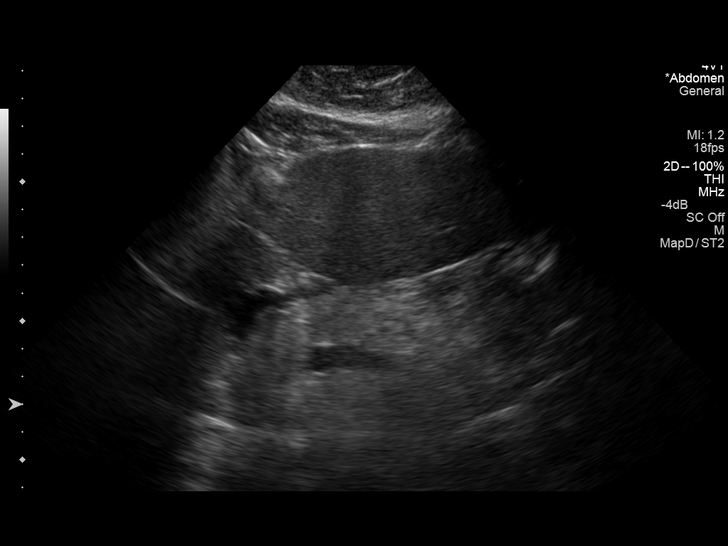
[im 7/84]
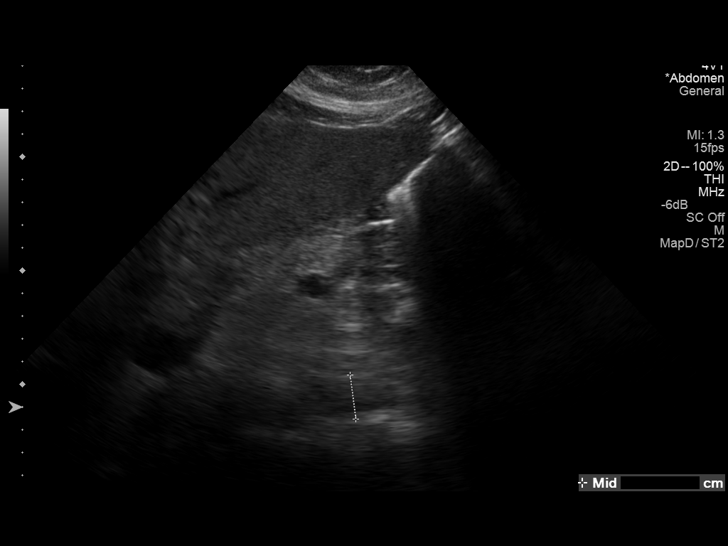
[im 14/84]
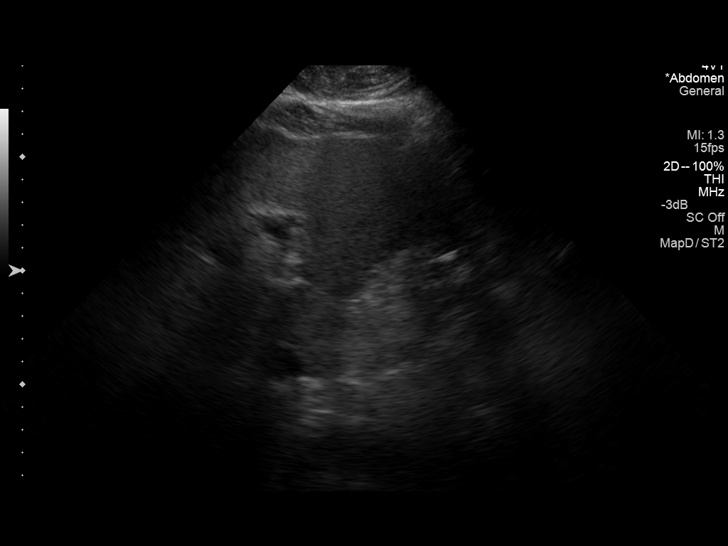
[im 21/84]
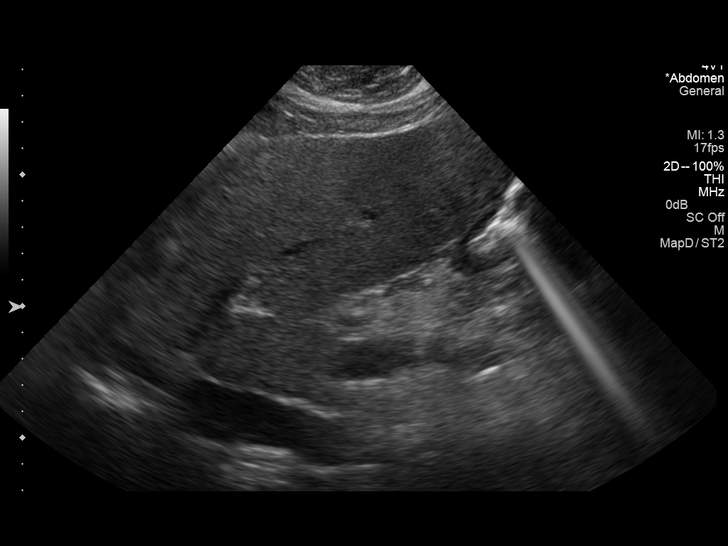
[im 28/84]
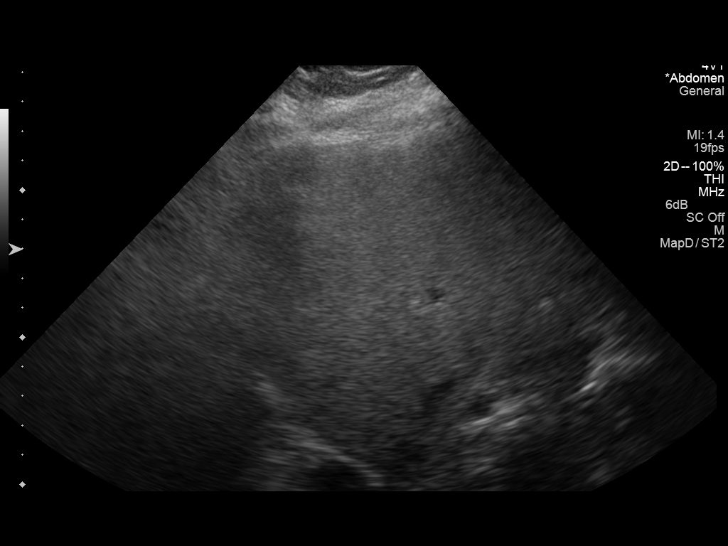
[im 32/84]
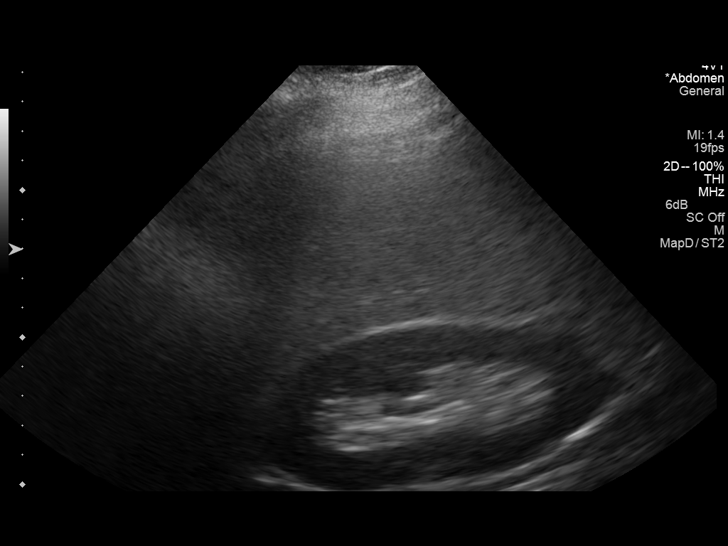
[im 39/84]
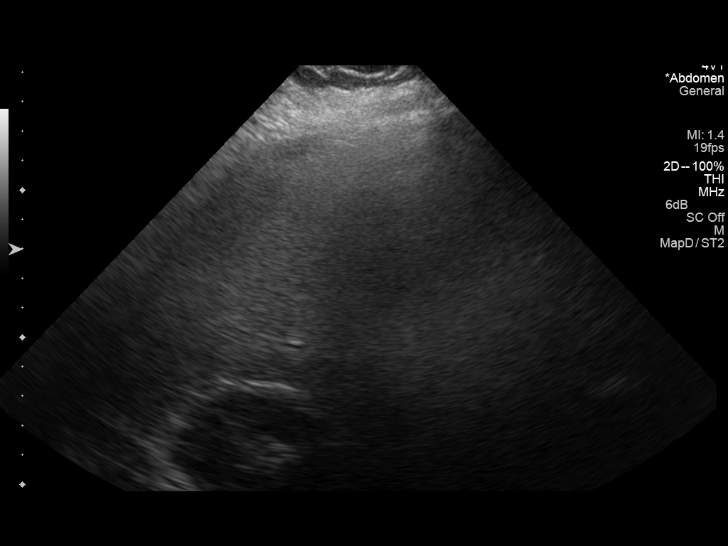
[im 45/84]
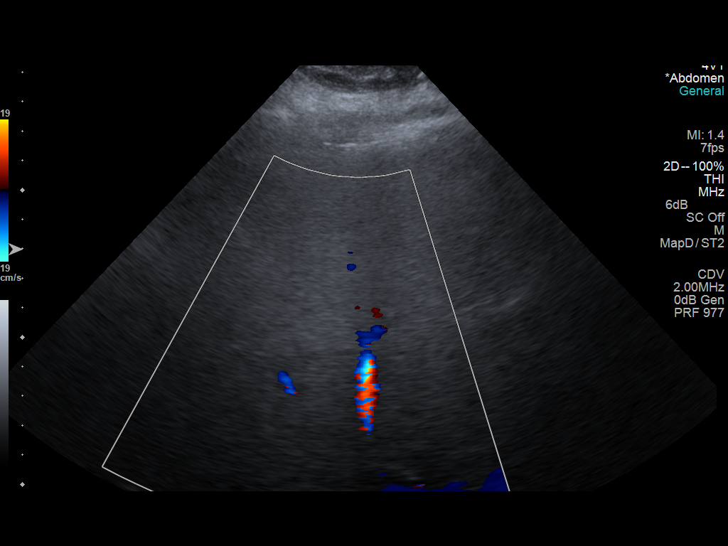
[im 52/84]
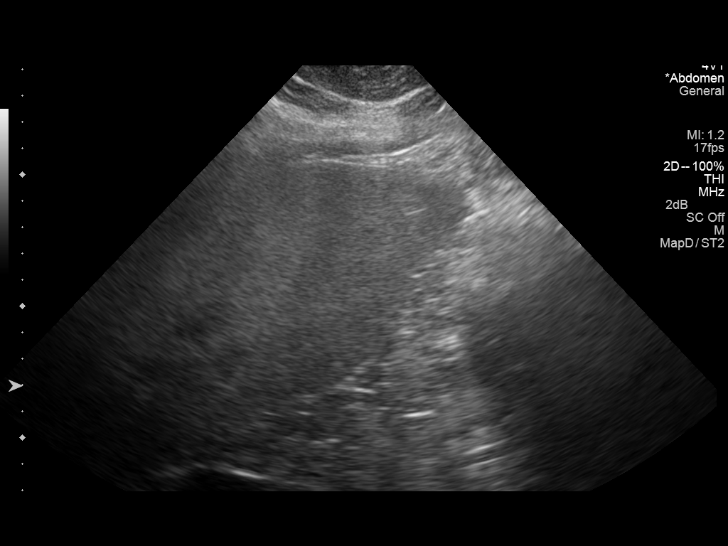
[im 56/84]
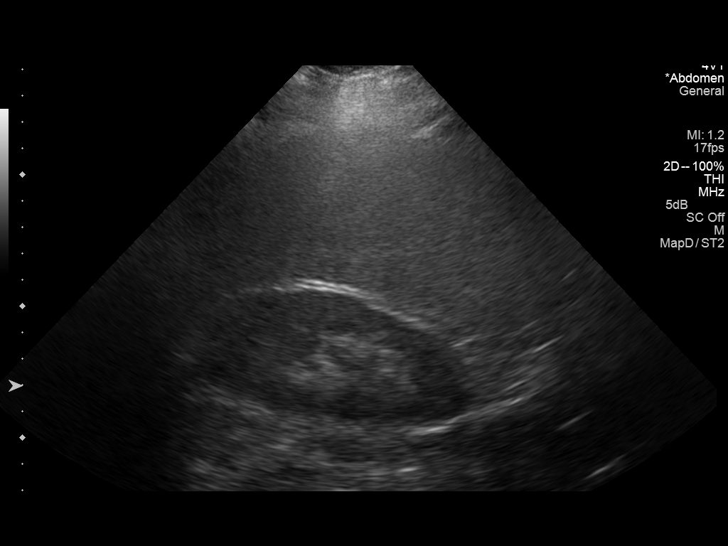
[im 63/84]
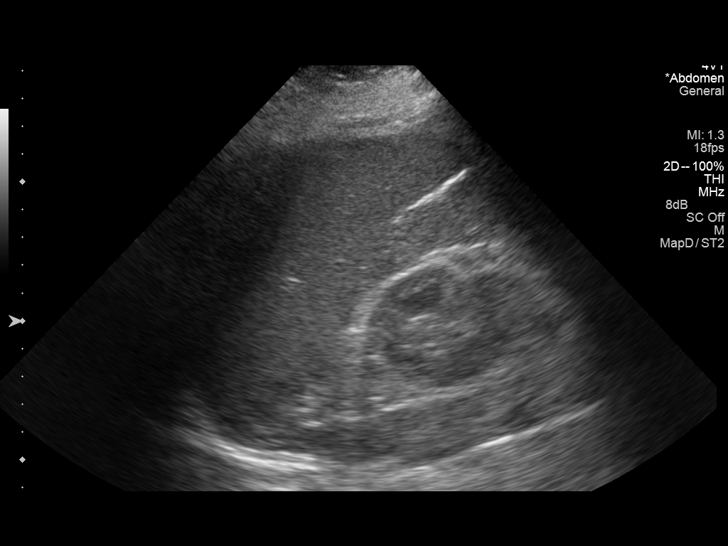
[im 70/84]
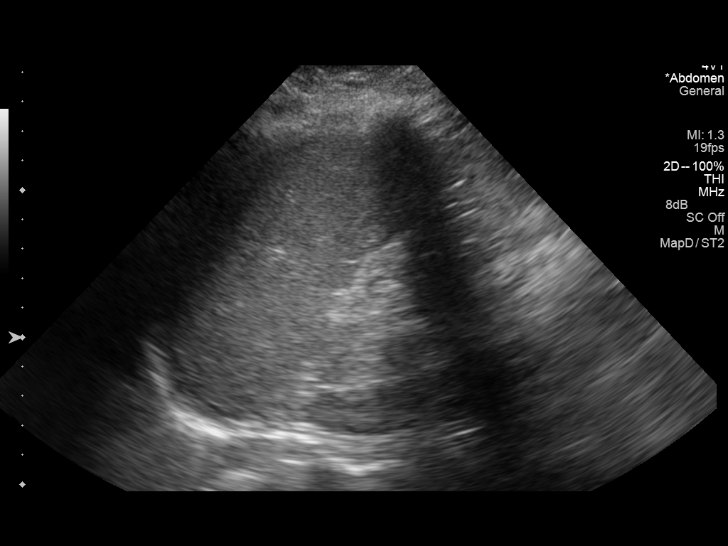
[im 77/84]
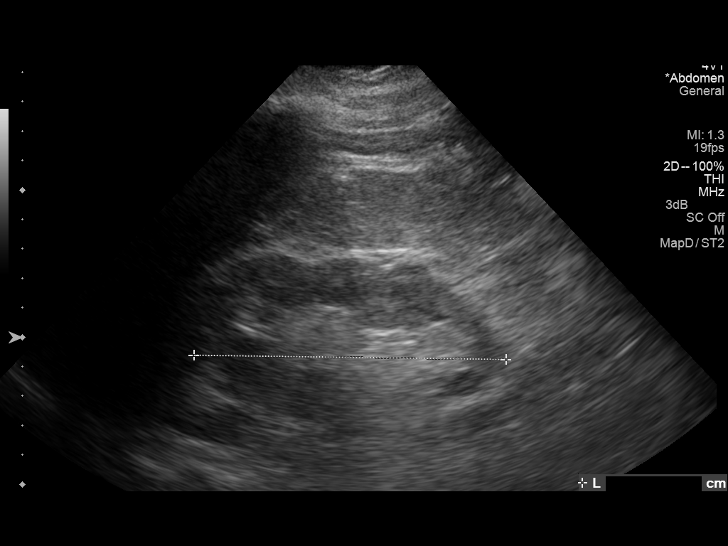
[im 84/84]
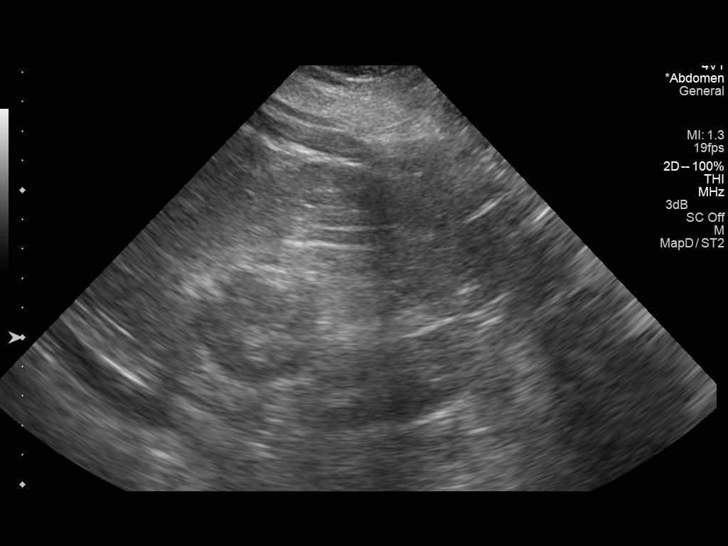

[14 of 25 positions shown; findings below may reference images not displayed]

FINDINGS: Gallbladder:  Surgically absent

Common Bile Duct:  Measures 4 mm in diameter which is within normal
limits.

Liver: Diffusely increased parenchymal echogenicity, consistent
with diffuse hepatic cellular disease, most likely steatosis or
cirrhosis.  No focal liver mass identified.

IVC:  Appears normal.

Pancreas:  No abnormality identified.

Spleen:  Within normal limits in size and echotexture.

Right kidney:  Normal in size and parenchymal echogenicity.  No
evidence of mass or hydronephrosis.

Left kidney:  Normal in size and parenchymal echogenicity.  No
evidence of mass or hydronephrosis.

Abdominal Aorta:  No aneurysm identified.
IMPRESSION: 1.  Prior cholecystectomy.  No evidence of biliary dilatation or
other acute findings.
2.  Diffuse hepatocellular disease, most likely due to steatosis or
cirrhosis.

## 2015-05-22 ENCOUNTER — Encounter: Payer: Self-pay | Admitting: Podiatry

## 2015-05-22 ENCOUNTER — Ambulatory Visit (INDEPENDENT_AMBULATORY_CARE_PROVIDER_SITE_OTHER): Payer: Medicare Other | Admitting: Podiatry

## 2015-05-22 ENCOUNTER — Ambulatory Visit (INDEPENDENT_AMBULATORY_CARE_PROVIDER_SITE_OTHER): Payer: Medicare Other

## 2015-05-22 VITALS — BP 122/63 | HR 64 | Resp 12

## 2015-05-22 DIAGNOSIS — M79672 Pain in left foot: Secondary | ICD-10-CM

## 2015-05-22 DIAGNOSIS — M79671 Pain in right foot: Secondary | ICD-10-CM

## 2015-05-22 DIAGNOSIS — M722 Plantar fascial fibromatosis: Secondary | ICD-10-CM

## 2015-05-22 NOTE — Progress Notes (Signed)
   Subjective:    Patient ID: [name], female    DOB: 07-22-1955, 60 y.o.   MRN: [id number]  HPI  This patient presents today complaining of bilateral inferior heel pain for the past 3 months, and his symptoms are gradually increasing over time with the right heel  more uncomfortable than the left. The symptoms occur upon standing and walking and FirstStep in the morning and are relieved with rest and elevation. Patient is use massage cream, icy hot, ice packs and soaking with some occasional relief of symptoms. She denies any prior history of direct injury or any significant increase in physical activity.  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       Objective:   Physical Exam  Orientated 3  DP pulses 2/4 bilaterally PT pulses 2/4 bilaterally Capillary reflex immediate bilaterally  Neurological: Sensation to 10 g monofilament wire intact 2/5 bilaterally Vibratory sensation reactive right nonreactive left Ankle reflexes were equal reactive bilaterally  Dermatological: No open skin lesions bilaterally  Musculoskeletal: Upon weight-bearing patient has painful gait Palpable tenderness medial central inferior right heel and along the lateral fascial band right without any palpable lesions. Palpable tenderness medial plantar fascial insertional area left without any palpable lesions Medium-high arch contour noted upon weight-bearing There is no restriction in range of motion of ankle, subtalar, midtarsal joints bilaterally  X-ray examination weightbearing right foot dated 05/22/2015 Intact bony structure without a fracture and/or dislocation Cavus foot type Inferior calcaneal spur  metatarsus adductus Radiographic impression: No acute bony abnormality noted in the x-ray of the right foot dated  05/22/2015  X-ray examination weightbearing left foot dated 05/21/2005 Intact bony structure without a fracture and/or dislocation Cavus foot type Inferior calcaneal spur  metatarsus adductus Radiographic impression: No acute bony abnormality noted in the x-ray of the left foot dated 05/22/2015        Assessment & Plan:   Assessment: Plantar fasciitis right more symptomatic in the left Diabetic with neuropathy  Plan: Today I reviewed the results of the x-ray examination with patient today and made aware that she has bilateral plantar fasciitis. I discussed the problem and treatment in detail with patient. I offered her Kenalog injection and she refused the injections saying that cortisone injections never helped her for any other problems that she had. Bilateral plantar fascial tapings apply the right and left feet with instructions to leave on 35 days Detail shoeing and stretching provided to patient  Reappoint 4 weeks

## 2015-05-22 NOTE — Patient Instructions (Signed)
Do as many of these as possible 2-3 time a day 30-60 seconds per exercise  Plantar Fasciitis With Rehab The plantar fascia is a fibrous, ligament-like, soft-tissue structure that spans the bottom of the foot. Plantar fasciitis, also called heel spur syndrome, is a condition that causes pain in the foot due to inflammation of the tissue. SYMPTOMS   Pain and tenderness on the underneath side of the foot.  Pain that worsens with standing or walking. CAUSES  Plantar fasciitis is caused by irritation and injury to the plantar fascia on the underneath side of the foot. Common mechanisms of injury include:  Direct trauma to bottom of the foot.  Damage to a small nerve that runs under the foot where the main fascia attaches to the heel bone.  Stress placed on the plantar fascia due to bone spurs. RISK INCREASES WITH:   Activities that place stress on the plantar fascia (running, jumping, pivoting, or cutting).  Poor strength and flexibility.  Improperly fitted shoes.  Tight calf muscles.  Flat feet.  Failure to warm-up properly before activity.  Obesity. PREVENTION  Warm up and stretch properly before activity.  Allow for adequate recovery between workouts.  Maintain physical fitness:  Strength, flexibility, and endurance.  Cardiovascular fitness.  Maintain a health body weight.  Avoid stress on the plantar fascia.  Wear properly fitted shoes, including arch supports for individuals who have flat feet. PROGNOSIS  If treated properly, then the symptoms of plantar fasciitis usually resolve without surgery. However, occasionally surgery is necessary. RELATED COMPLICATIONS   Recurrent symptoms that may result in a chronic condition.  Problems of the lower back that are caused by compensating for the injury, such as limping.  Pain or weakness of the foot during push-off following surgery.  Chronic inflammation, scarring, and partial or complete fascia tear, occurring  more often from repeated injections. TREATMENT  Treatment initially involves the use of ice and medication to help reduce pain and inflammation. The use of strengthening and stretching exercises may help reduce pain with activity, especially stretches of the Achilles tendon. These exercises may be performed at home or with a therapist. Your caregiver may recommend that you use heel cups of arch supports to help reduce stress on the plantar fascia. Occasionally, corticosteroid injections are given to reduce inflammation. If symptoms persist for greater than 6 months despite non-surgical (conservative), then surgery may be recommended.  MEDICATION   If pain medication is necessary, then nonsteroidal anti-inflammatory medications, such as aspirin and ibuprofen, or other minor pain relievers, such as acetaminophen, are often recommended.  Do not take pain medication within 7 days before surgery.  Prescription pain relievers may be given if deemed necessary by your caregiver. Use only as directed and only as much as you need.  Corticosteroid injections may be given by your caregiver. These injections should be reserved for the most serious cases, because they may only be given a certain number of times. HEAT AND COLD  Cold treatment (icing) relieves pain and reduces inflammation. Cold treatment should be applied for 10 to 15 minutes every 2 to 3 hours for inflammation and pain and immediately after any activity that aggravates your symptoms. Use ice packs or massage the area with a piece of ice (ice massage).  Heat treatment may be used prior to performing the stretching and strengthening activities prescribed by your caregiver, physical therapist, or athletic trainer. Use a heat pack or soak the injury in warm water. SEEK IMMEDIATE MEDICAL CARE IF:  Treatment  seems to offer no benefit, or the condition worsens.  Any medications produce adverse side effects. EXERCISES RANGE OF MOTION (ROM) AND  STRETCHING EXERCISES - Plantar Fasciitis (Heel Spur Syndrome) These exercises may help you when beginning to rehabilitate your injury. Your symptoms may resolve with or without further involvement from your physician, physical therapist or athletic trainer. While completing these exercises, remember:   Restoring tissue flexibility helps normal motion to return to the joints. This allows healthier, less painful movement and activity.  An effective stretch should be held for at least 30 seconds.  A stretch should never be painful. You should only feel a gentle lengthening or release in the stretched tissue. RANGE OF MOTION - Toe Extension, Flexion  Sit with your right / left leg crossed over your opposite knee.  Grasp your toes and gently pull them back toward the top of your foot. You should feel a stretch on the bottom of your toes and/or foot.  Hold this stretch for __________ seconds.  Now, gently pull your toes toward the bottom of your foot. You should feel a stretch on the top of your toes and or foot.  Hold this stretch for __________ seconds. Repeat __________ times. Complete this stretch __________ times per day.  RANGE OF MOTION - Ankle Dorsiflexion, Active Assisted  Remove shoes and sit on a chair that is preferably not on a carpeted surface.  Place right / left foot under knee. Extend your opposite leg for support.  Keeping your heel down, slide your right / left foot back toward the chair until you feel a stretch at your ankle or calf. If you do not feel a stretch, slide your bottom forward to the edge of the chair, while still keeping your heel down.  Hold this stretch for __________ seconds. Repeat __________ times. Complete this stretch __________ times per day.  STRETCH - Gastroc, Standing  Place hands on wall.  Extend right / left leg, keeping the front knee somewhat bent.  Slightly point your toes inward on your back foot.  Keeping your right / left heel on the  floor and your knee straight, shift your weight toward the wall, not allowing your back to arch.  You should feel a gentle stretch in the right / left calf. Hold this position for __________ seconds. Repeat __________ times. Complete this stretch __________ times per day. STRETCH - Soleus, Standing  Place hands on wall.  Extend right / left leg, keeping the other knee somewhat bent.  Slightly point your toes inward on your back foot.  Keep your right / left heel on the floor, bend your back knee, and slightly shift your weight over the back leg so that you feel a gentle stretch deep in your back calf.  Hold this position for __________ seconds. Repeat __________ times. Complete this stretch __________ times per day. STRETCH - Gastrocsoleus, Standing  Note: This exercise can place a lot of stress on your foot and ankle. Please complete this exercise only if specifically instructed by your caregiver.   Place the ball of your right / left foot on a step, keeping your other foot firmly on the same step.  Hold on to the wall or a rail for balance.  Slowly lift your other foot, allowing your body weight to press your heel down over the edge of the step.  You should feel a stretch in your right / left calf.  Hold this position for __________ seconds.  Repeat this exercise with a  slight bend in your right / left knee. Repeat __________ times. Complete this stretch __________ times per day.  STRENGTHENING EXERCISES - Plantar Fasciitis (Heel Spur Syndrome)  These exercises may help you when beginning to rehabilitate your injury. They may resolve your symptoms with or without further involvement from your physician, physical therapist or athletic trainer. While completing these exercises, remember:   Muscles can gain both the endurance and the strength needed for everyday activities through controlled exercises.  Complete these exercises as instructed by your physician, physical therapist or  athletic trainer. Progress the resistance and repetitions only as guided. STRENGTH - Towel Curls  Sit in a chair positioned on a non-carpeted surface.  Place your foot on a towel, keeping your heel on the floor.  Pull the towel toward your heel by only curling your toes. Keep your heel on the floor.  If instructed by your physician, physical therapist or athletic trainer, add ____________________ at the end of the towel. Repeat __________ times. Complete this exercise __________ times per day. STRENGTH - Ankle Inversion  Secure one end of a rubber exercise band/tubing to a fixed object (table, pole). Loop the other end around your foot just before your toes.  Place your fists between your knees. This will focus your strengthening at your ankle.  Slowly, pull your big toe up and in, making sure the band/tubing is positioned to resist the entire motion.  Hold this position for __________ seconds.  Have your muscles resist the band/tubing as it slowly pulls your foot back to the starting position. Repeat __________ times. Complete this exercises __________ times per day.    This information is not intended to replace advice given to you by your health care provider. Make sure you discuss any questions you have with your health care provider.   Document Released: 01/19/2005 Document Revised: 06/05/2014 Document Reviewed: 05/03/2008 Elsevier Interactive Patient Education [employer].

## 2015-05-24 ENCOUNTER — Other Ambulatory Visit: Payer: Self-pay | Admitting: Interventional Cardiology

## 2015-06-05 DIAGNOSIS — H10503 Unspecified blepharoconjunctivitis, bilateral: Secondary | ICD-10-CM | POA: Diagnosis not present

## 2015-06-26 ENCOUNTER — Other Ambulatory Visit (HOSPITAL_COMMUNITY): Payer: Self-pay | Admitting: Pulmonary Disease

## 2015-06-26 DIAGNOSIS — J984 Other disorders of lung: Secondary | ICD-10-CM

## 2015-06-26 DIAGNOSIS — R918 Other nonspecific abnormal finding of lung field: Secondary | ICD-10-CM

## 2015-07-02 ENCOUNTER — Ambulatory Visit: Payer: Medicare Other | Admitting: Podiatry

## 2015-07-05 ENCOUNTER — Ambulatory Visit (INDEPENDENT_AMBULATORY_CARE_PROVIDER_SITE_OTHER): Payer: Medicare Other | Admitting: Interventional Cardiology

## 2015-07-05 ENCOUNTER — Encounter: Payer: Self-pay | Admitting: Interventional Cardiology

## 2015-07-05 VITALS — BP 140/84 | HR 93 | Ht 63.0 in | Wt 224.0 lb

## 2015-07-05 DIAGNOSIS — R9439 Abnormal result of other cardiovascular function study: Secondary | ICD-10-CM

## 2015-07-05 DIAGNOSIS — I2510 Atherosclerotic heart disease of native coronary artery without angina pectoris: Secondary | ICD-10-CM

## 2015-07-05 DIAGNOSIS — I10 Essential (primary) hypertension: Secondary | ICD-10-CM | POA: Diagnosis not present

## 2015-07-05 DIAGNOSIS — E785 Hyperlipidemia, unspecified: Secondary | ICD-10-CM

## 2015-07-05 MED ORDER — AMLODIPINE BESYLATE 10 MG PO TABS: 10.0000 mg | ORAL_TABLET | Freq: Every day | ORAL | Status: DC

## 2015-07-05 NOTE — Progress Notes (Signed)
Cardiology Office Note    Date:  07/05/2015   ID:  [name], DOB 03-08-1955, MRN [id number]  PCP:  Alonza Bogus, MD  Cardiologist: Sinclair Grooms, MD   Chief Complaint  Patient presents with  . Coronary Artery Disease    History of Present Illness:  [name] is a 60 y.o. female is followed for CAD, OSA, diabetes mellitus, essential hypertension.  Occasional discomfort occurs with moderate physical activity. It is responsive to nitroglycerin. She is known to have 60-80% stenosis in the distal LAD. Stents in the proximal LAD are widely patent at the time of the last heart catheterization performed in May 2014. The cath was performed after an apical defect was noted on [facility] [place].  Past Medical History  Diagnosis Date  . Diabetes mellitus   . Hypertension   . Asthma   . Thyroid disease   . Coronary artery disease   . Hypercholesteremia   . Panic attack   . Sleep apnea   . Chest pain   . GERD (gastroesophageal reflux disease)     Past Surgical History  Procedure Laterality Date  . Coronary angioplasty with stent placement    . Cholecystectomy    . Abdominal hysterectomy    . Elbow surgery    . Back surgery    . Knee surgery    . Left heart catheterization with coronary angiogram N/A 06/23/2012    Procedure: LEFT HEART CATHETERIZATION WITH CORONARY ANGIOGRAM;  Surgeon: Sinclair Grooms, MD;  Location: [facility];  Service: Cardiovascular;  Laterality: N/A;    Current Medications: Outpatient Prescriptions Prior to Visit  Medication Sig Dispense Refill  . albuterol (PROVENTIL HFA;VENTOLIN HFA) 108 (90 BASE) MCG/ACT inhaler Inhale 2 puffs into the lungs every 4 (four) hours as needed for wheezing.    Marland Kitchen amLODipine (NORVASC) 10 MG tablet Take 1 tablet (10 mg total) by mouth daily. 90 tablet 1  . aspirin EC 81 MG tablet Take 81 mg by mouth daily.    . budesonide-formoterol (SYMBICORT) 160-4.5 MCG/ACT inhaler Inhale 2 puffs into  the lungs 2 (two) times daily.    . clopidogrel (PLAVIX) 75 MG tablet TAKE 1 TABLET (75 MG TOTAL) BY MOUTH DAILY. 90 tablet 0  . cyclobenzaprine (FLEXERIL) 10 MG tablet Take 10 mg by mouth 3 (three) times daily as needed for muscle spasms.    . furosemide (LASIX) 40 MG tablet Take 40 mg by mouth 2 (two) times daily.      Marland Kitchen gabapentin (NEURONTIN) 300 MG capsule Take 300 mg by mouth at bedtime.    Marland Kitchen HYDROcodone-acetaminophen (NORCO) 10-325 MG per tablet Take 1 tablet by mouth every 6 (six) hours as needed for pain.    Marland Kitchen insulin lispro protamine-lispro (HUMALOG 75/25) (75-25) 100 UNIT/ML SUSP Inject 70-75 Units into the skin 2 (two) times daily with a meal. Takes 75 units in am and 70 units in pm    . isosorbide mononitrate (IMDUR) 60 MG 24 hr tablet Take 1 tablet (60 mg total) by mouth daily. 90 tablet 0  . levothyroxine (SYNTHROID, LEVOTHROID) 200 MCG tablet Take 200 mcg by mouth daily before breakfast.    . losartan (COZAAR) 100 MG tablet Take 100 mg by mouth daily.    . metformin (FORTAMET) 500 MG (OSM) 24 hr tablet Take 500 mg by mouth 2 (two) times daily with a meal.     . metoprolol (TOPROL-XL) 200 MG 24 hr tablet Take 200 mg by  mouth daily.      . mirtazapine (REMERON) 30 MG tablet Take 30 mg by mouth at bedtime.    Marland Kitchen NITROSTAT 0.4 MG SL tablet PLACE 1 TABLET (0.4 MG TOTAL) UNDER THE TONGUE EVERY 5 (FIVE) MINUTES AS NEEDED FOR CHEST PAIN. 25 tablet 2  . omeprazole (PRILOSEC) 20 MG capsule Take 20 mg by mouth daily.    . potassium chloride SA (K-DUR,KLOR-CON) 20 MEQ tablet Take 40 mEq by mouth daily.    . rosuvastatin (CRESTOR) 40 MG tablet Take 40 mg by mouth daily.      Marland Kitchen tiotropium (SPIRIVA) 18 MCG inhalation capsule Place 18 mcg into inhaler and inhale daily.    Marland Kitchen amLODipine (NORVASC) 10 MG tablet Take 1 tablet (10 mg total) by mouth daily. (Patient not taking: Reported on 07/05/2015) 90 tablet 1  . diazepam (VALIUM) 5 MG tablet Take 5 mg by mouth daily. Reported on 07/05/2015    . ranolazine  (RANEXA) 500 MG 12 hr tablet Take 1 tablet (500 mg total) by mouth 2 (two) times daily. (Patient not taking: Reported on 07/05/2015) 60 tablet 10   No facility-administered medications prior to visit.     Allergies:   Tape   Social History   Social History  . Marital Status: Divorced    Spouse Name: N/A  . Number of Children: N/A  . Years of Education: N/A   Social History Main Topics  . Smoking status: Never Smoker   . Smokeless tobacco: Never Used  . Alcohol Use: No  . Drug Use: No  . Sexual Activity: Not on file   Other Topics Concern  . Not on file   Social History Narrative     Family History:  The patient's family history includes COPD in her mother; Heart attack in her father; Heart disease in her father.   ROS:   Please see the history of present illness.    Multiple somatic complaints including leg swelling, cough, blood in urine, back pain, muscle pain, leg weakness, dizziness, easy bruising, headaches, anxiety, wheezing, facial swelling, leg pain, excessive fatigue and sweating.  All other systems reviewed and are negative.   PHYSICAL EXAM:   VS:  BP 140/84 mmHg  Pulse 93  Ht  (1.6 m)  Wt 224 lb (101.606 kg)  BMI 39.69 kg/m2   GEN: Well nourished, well developed, in no acute distress HEENT: normal Neck: no JVD, carotid bruits, or masses Cardiac: RRR; no murmurs, rubs, or gallops,no edema  Respiratory:  clear to auscultation bilaterally, normal work of breathing GI: soft, nontender, nondistended, + BS MS: no deformity or atrophy Skin: warm and dry, no rash Neuro:  Alert and Oriented x 3, Strength and sensation are intact Psych: euthymic mood, full affect  Wt Readings from Last 3 Encounters:  07/05/15 224 lb (101.606 kg)  04/18/15 220 lb (99.791 kg)  08/09/14 220 lb (99.791 kg)      Studies/Labs Reviewed:   EKG:  EKG  Sinus rhythm, left axis deviation, right bundle branch block, normal PR interval, and left anterior hemiblock.  Recent  Labs: No results found for requested labs within last 365 days.   Lipid Panel No results found for: CHOL, TRIG, HDL, CHOLHDL, VLDL, LDLCALC, LDLDIRECT  Additional studies/ records that were reviewed today include:  Reviewed coronary angiogram from 2014.  IMPRESSIONS: 1. New apical defect on nuclear scintigraphy correlates with a distal LAD 75-85% stenosis. The proximal LAD stents are patent.  2. The RCA and circumflex are widely patent.  3. LVEF 75% by recent nuclear scintigraphy. LV gram impressions were not recorded because of difficulties as noted above.  ASSESSMENT:    1. Coronary arteriosclerosis in native artery   2. Essential hypertension   3. Hyperlipidemia   4. Abnormal cardiovascular stress test      PLAN:  In order of problems listed above:  1. I encouraged aerobic activity to help generate collateralization of the distal LAD stenosis. Also encouraged weight loss and a heart healthy diet. 2. 2 g sodium diet, weight loss, and aerobic activity as prescribed. 3. Continue statin therapy. Lipid values are being followed by Dr. Luan Pulling.    Medication Adjustments/Labs and Tests Ordered: Current medicines are reviewed at length with the patient today.  Concerns regarding medicines are outlined above.  Medication changes, Labs and Tests ordered today are listed in the Patient Instructions below. Patient Instructions  Medication Instructions:  Your physician recommends that you continue on your current medications as directed. Please refer to the Current Medication list given to you today.   Labwork: none  Testing/Procedures: none  Follow-Up: Your physician wants you to follow-up in: 12 months.  You will receive a reminder letter in the mail two months in advance. If you don't receive a letter, please call our office to schedule the follow-up appointment.   Any Other Special Instructions Will Be Listed Below (If Applicable).     If you need a refill on your  cardiac medications before your next appointment, please call your pharmacy.       Signed, Sinclair Grooms, MD  07/05/2015 4:31 PM    [facility] [address], [place], [place]  [address] Phone: [phone]; Fax: [phone]

## 2015-07-05 NOTE — Patient Instructions (Signed)

## 2015-07-24 ENCOUNTER — Ambulatory Visit: Payer: Medicare Other | Admitting: Podiatry

## 2015-07-29 DIAGNOSIS — Z794 Long term (current) use of insulin: Secondary | ICD-10-CM | POA: Diagnosis not present

## 2015-07-29 DIAGNOSIS — Z7984 Long term (current) use of oral hypoglycemic drugs: Secondary | ICD-10-CM | POA: Diagnosis not present

## 2015-07-29 DIAGNOSIS — H524 Presbyopia: Secondary | ICD-10-CM | POA: Diagnosis not present

## 2015-07-29 DIAGNOSIS — E119 Type 2 diabetes mellitus without complications: Secondary | ICD-10-CM | POA: Diagnosis not present

## 2015-08-19 DIAGNOSIS — M542 Cervicalgia: Secondary | ICD-10-CM | POA: Diagnosis not present

## 2015-08-19 DIAGNOSIS — G4733 Obstructive sleep apnea (adult) (pediatric): Secondary | ICD-10-CM | POA: Diagnosis not present

## 2015-08-19 DIAGNOSIS — I10 Essential (primary) hypertension: Secondary | ICD-10-CM | POA: Diagnosis not present

## 2015-08-19 DIAGNOSIS — I2510 Atherosclerotic heart disease of native coronary artery without angina pectoris: Secondary | ICD-10-CM | POA: Diagnosis not present

## 2015-08-20 DIAGNOSIS — M4712 Other spondylosis with myelopathy, cervical region: Secondary | ICD-10-CM | POA: Diagnosis not present

## 2015-08-21 ENCOUNTER — Ambulatory Visit: Payer: Medicare Other | Admitting: Podiatry

## 2015-09-11 ENCOUNTER — Encounter (INDEPENDENT_AMBULATORY_CARE_PROVIDER_SITE_OTHER): Payer: Medicare Other | Admitting: Podiatry

## 2015-09-11 NOTE — Progress Notes (Signed)
This encounter was created in error - please disregard.  This encounter was created in error - please disregard.

## 2015-10-11 DIAGNOSIS — Z23 Encounter for immunization: Secondary | ICD-10-CM | POA: Diagnosis not present

## 2015-11-19 DIAGNOSIS — M542 Cervicalgia: Secondary | ICD-10-CM | POA: Diagnosis not present

## 2015-11-19 DIAGNOSIS — I2510 Atherosclerotic heart disease of native coronary artery without angina pectoris: Secondary | ICD-10-CM | POA: Diagnosis not present

## 2015-11-19 DIAGNOSIS — E1140 Type 2 diabetes mellitus with diabetic neuropathy, unspecified: Secondary | ICD-10-CM | POA: Diagnosis not present

## 2015-11-19 DIAGNOSIS — I10 Essential (primary) hypertension: Secondary | ICD-10-CM | POA: Diagnosis not present

## 2015-12-29 ENCOUNTER — Other Ambulatory Visit: Payer: Self-pay | Admitting: Interventional Cardiology

## 2016-02-25 DIAGNOSIS — E039 Hypothyroidism, unspecified: Secondary | ICD-10-CM | POA: Diagnosis not present

## 2016-02-25 DIAGNOSIS — E7800 Pure hypercholesterolemia, unspecified: Secondary | ICD-10-CM | POA: Diagnosis not present

## 2016-02-25 DIAGNOSIS — I10 Essential (primary) hypertension: Secondary | ICD-10-CM | POA: Diagnosis not present

## 2016-02-25 DIAGNOSIS — E1165 Type 2 diabetes mellitus with hyperglycemia: Secondary | ICD-10-CM | POA: Diagnosis not present

## 2016-03-03 ENCOUNTER — Other Ambulatory Visit (HOSPITAL_COMMUNITY): Payer: Self-pay | Admitting: Pulmonary Disease

## 2016-03-03 DIAGNOSIS — Z1231 Encounter for screening mammogram for malignant neoplasm of breast: Secondary | ICD-10-CM

## 2016-03-03 DIAGNOSIS — Z780 Asymptomatic menopausal state: Secondary | ICD-10-CM

## 2016-03-03 DIAGNOSIS — Z23 Encounter for immunization: Secondary | ICD-10-CM | POA: Diagnosis not present

## 2016-03-03 DIAGNOSIS — Z0000 Encounter for general adult medical examination without abnormal findings: Secondary | ICD-10-CM | POA: Diagnosis not present

## 2016-03-11 ENCOUNTER — Ambulatory Visit (HOSPITAL_COMMUNITY): Admission: RE | Admit: 2016-03-11 | Payer: Medicare Other | Source: Ambulatory Visit

## 2016-03-11 ENCOUNTER — Other Ambulatory Visit (HOSPITAL_COMMUNITY): Payer: Medicare Other

## 2016-03-11 ENCOUNTER — Encounter (HOSPITAL_COMMUNITY): Payer: Self-pay

## 2016-03-16 ENCOUNTER — Ambulatory Visit (HOSPITAL_COMMUNITY): Payer: Medicare Other

## 2016-03-16 ENCOUNTER — Ambulatory Visit (HOSPITAL_COMMUNITY)
Admission: RE | Admit: 2016-03-16 | Discharge: 2016-03-16 | Disposition: A | Discharge status: Home / Self Care | Payer: Medicare Other | Source: Ambulatory Visit | Attending: Pulmonary Disease | Admitting: Pulmonary Disease

## 2016-03-16 DIAGNOSIS — M542 Cervicalgia: Secondary | ICD-10-CM | POA: Diagnosis not present

## 2016-03-16 DIAGNOSIS — K219 Gastro-esophageal reflux disease without esophagitis: Secondary | ICD-10-CM | POA: Diagnosis not present

## 2016-03-16 DIAGNOSIS — N951 Menopausal and female climacteric states: Secondary | ICD-10-CM | POA: Diagnosis not present

## 2016-03-16 DIAGNOSIS — M150 Primary generalized (osteo)arthritis: Secondary | ICD-10-CM | POA: Diagnosis not present

## 2016-03-16 DIAGNOSIS — I2510 Atherosclerotic heart disease of native coronary artery without angina pectoris: Secondary | ICD-10-CM | POA: Diagnosis not present

## 2016-03-16 DIAGNOSIS — I10 Essential (primary) hypertension: Secondary | ICD-10-CM | POA: Diagnosis not present

## 2016-03-16 DIAGNOSIS — Z780 Asymptomatic menopausal state: Secondary | ICD-10-CM

## 2016-03-16 DIAGNOSIS — Z0000 Encounter for general adult medical examination without abnormal findings: Secondary | ICD-10-CM | POA: Diagnosis not present

## 2016-03-16 DIAGNOSIS — F419 Anxiety disorder, unspecified: Secondary | ICD-10-CM | POA: Diagnosis not present

## 2016-03-16 DIAGNOSIS — R739 Hyperglycemia, unspecified: Secondary | ICD-10-CM | POA: Diagnosis not present

## 2016-03-16 DIAGNOSIS — E2839 Other primary ovarian failure: Secondary | ICD-10-CM | POA: Diagnosis not present

## 2016-03-16 DIAGNOSIS — E039 Hypothyroidism, unspecified: Secondary | ICD-10-CM | POA: Diagnosis not present

## 2016-03-16 DIAGNOSIS — Z1231 Encounter for screening mammogram for malignant neoplasm of breast: Secondary | ICD-10-CM | POA: Insufficient documentation

## 2016-03-16 DIAGNOSIS — E785 Hyperlipidemia, unspecified: Secondary | ICD-10-CM | POA: Diagnosis not present

## 2016-03-16 DIAGNOSIS — E1140 Type 2 diabetes mellitus with diabetic neuropathy, unspecified: Secondary | ICD-10-CM | POA: Diagnosis not present

## 2016-03-16 DIAGNOSIS — E119 Type 2 diabetes mellitus without complications: Secondary | ICD-10-CM | POA: Diagnosis not present

## 2016-03-18 ENCOUNTER — Other Ambulatory Visit (HOSPITAL_COMMUNITY): Payer: Self-pay | Admitting: Pulmonary Disease

## 2016-03-18 DIAGNOSIS — Z1211 Encounter for screening for malignant neoplasm of colon: Secondary | ICD-10-CM | POA: Diagnosis not present

## 2016-03-18 DIAGNOSIS — R928 Other abnormal and inconclusive findings on diagnostic imaging of breast: Secondary | ICD-10-CM

## 2016-03-24 ENCOUNTER — Ambulatory Visit: Payer: Medicare Other | Admitting: Podiatry

## 2016-04-07 ENCOUNTER — Ambulatory Visit (HOSPITAL_COMMUNITY)
Admission: RE | Admit: 2016-04-07 | Discharge: 2016-04-07 | Disposition: A | Discharge status: Home / Self Care | Payer: Medicare Other | Source: Ambulatory Visit | Attending: Pulmonary Disease | Admitting: Pulmonary Disease

## 2016-04-07 DIAGNOSIS — R928 Other abnormal and inconclusive findings on diagnostic imaging of breast: Secondary | ICD-10-CM | POA: Diagnosis not present

## 2016-04-15 ENCOUNTER — Ambulatory Visit: Payer: Medicare Other | Admitting: Podiatry

## 2016-05-27 ENCOUNTER — Ambulatory Visit: Payer: Medicare Other | Admitting: Podiatry

## 2016-06-24 ENCOUNTER — Encounter: Payer: Self-pay | Admitting: Podiatry

## 2016-06-24 ENCOUNTER — Ambulatory Visit (INDEPENDENT_AMBULATORY_CARE_PROVIDER_SITE_OTHER): Payer: Medicare Other | Admitting: Podiatry

## 2016-06-24 VITALS — BP 156/82 | HR 74 | Resp 18 | Ht 64.0 in | Wt 204.0 lb

## 2016-06-24 DIAGNOSIS — M2041 Other hammer toe(s) (acquired), right foot: Secondary | ICD-10-CM

## 2016-06-24 NOTE — Patient Instructions (Signed)
Today her diabetic foot exam revealed adequate circulation with decreased feeling. The second right toe is beginning to drift towards the big toe. There is no surrounding inflammation around the toe. At this time I recommend you insert the silicone toe wedge between your great toe and second toe and wear this on a daily basis. Return a your request or yearly  Diabetes and Foot Care Diabetes may cause you to have problems because of poor blood supply (circulation) to your feet and legs. This may cause the skin on your feet to become thinner, break easier, and heal more slowly. Your skin may become dry, and the skin may peel and crack. You may also have nerve damage in your legs and feet causing decreased feeling in them. You may not notice minor injuries to your feet that could lead to infections or more serious problems. Taking care of your feet is one of the most important things you can do for yourself. Follow these instructions at home:  Wear shoes at all times, even in the house. Do not go barefoot. Bare feet are easily injured.  Check your feet daily for blisters, cuts, and redness. If you cannot see the bottom of your feet, use a mirror or ask someone for help.  Wash your feet with warm water (do not use hot water) and mild soap. Then pat your feet and the areas between your toes until they are completely dry. Do not soak your feet as this can dry your skin.  Apply a moisturizing lotion or petroleum jelly (that does not contain alcohol and is unscented) to the skin on your feet and to dry, brittle toenails. Do not apply lotion between your toes.  Trim your toenails straight across. Do not dig under them or around the cuticle. File the edges of your nails with an emery board or nail file.  Do not cut corns or calluses or try to remove them with medicine.  Wear clean socks or stockings every day. Make sure they are not too tight. Do not wear knee-high stockings since they may decrease blood  flow to your legs.  Wear shoes that fit properly and have enough cushioning. To break in new shoes, wear them for just a few hours a day. This prevents you from injuring your feet. Always look in your shoes before you put them on to be sure there are no objects inside.  Do not cross your legs. This may decrease the blood flow to your feet.  If you find a minor scrape, cut, or break in the skin on your feet, keep it and the skin around it clean and dry. These areas may be cleansed with mild soap and water. Do not cleanse the area with peroxide, alcohol, or iodine.  When you remove an adhesive bandage, be sure not to damage the skin around it.  If you have a wound, look at it several times a day to make sure it is healing.  Do not use heating pads or hot water bottles. They may burn your skin. If you have lost feeling in your feet or legs, you may not know it is happening until it is too late.  Make sure your health care provider performs a complete foot exam at least annually or more often if you have foot problems. Report any cuts, sores, or bruises to your health care provider immediately. Contact a health care provider if:  You have an injury that is not healing.  You have cuts or  breaks in the skin.  You have an ingrown nail.  You notice redness on your legs or feet.  You feel burning or tingling in your legs or feet.  You have pain or cramps in your legs and feet.  Your legs or feet are numb.  Your feet always feel cold. Get help right away if:  There is increasing redness, swelling, or pain in or around a wound.  There is a red line that goes up your leg.  Pus is coming from a wound.  You develop a fever or as directed by your health care provider.  You notice a bad smell coming from an ulcer or wound. This information is not intended to replace advice given to you by your health care provider. Make sure you discuss any questions you have with your health care  provider. Document Released: 01/17/2000 Document Revised: 06/27/2015 Document Reviewed: 06/28/2012 Elsevier Interactive Patient Education  2017 [employer].

## 2016-06-24 NOTE — Progress Notes (Signed)
   Subjective:    Patient ID: [name], female    DOB: [birth year], 61 y.o.   MRN: [id number]  HPI      This patient presents today complaining that the second right toe is drifting towards the great toe when walking in the past 4 months. Patient is placed a shield over the toe, however, without any relief of an annoying sensation that she feels when she is walking particularly at toe off during swing phase. She denies any local inflammation or drainage around the hallux and second right toe. Patient is a diabetic denies any history of foot ulceration, claudication or amputation Patient has history of burning, stinging and numbness in her feet on off weightbearing Patient denies smoking history Patient was last evaluated for bilateral fasciitis she says has resolved  Review of Systems  Eyes: Positive for visual disturbance.  Endocrine: Positive for heat intolerance.  Genitourinary: Positive for frequency and urgency.  Musculoskeletal: Positive for back pain and neck pain.  Neurological: Positive for weakness.  Hematological: Bruises/bleeds easily.  Psychiatric/Behavioral: The patient is nervous/anxious.   All other systems reviewed and are negative.      Objective:   Physical Exam   Orientated 3  DP pulses 2/4 bilaterally PT pulses 2/4 bilaterally Capillary reflex immediate bilaterally  Neurological: Sensation to 10 g monofilament wire intact 0/5 right and 1/5 left Vibratory sensation nonreactive bilaterally Ankle reflexes were equal reactive bilaterally  Dermatological: No open skin lesions bilaterally Texture and turgor within normal limits Absent hair growth bilaterally  Musculoskeletal: Patient is stable gait without evidence of pain Upon weight-bearing the second right toe tends to drift medially at the MPJ level. There is no sign of erythema, edema in or around the second MPJ dorsally plantarly. Manual motor testing dorsi flexion, plantar flexion,  inversion, eversion 5/5 bilaterally        Assessment & Plan:   Assessment: Satisfactory vascular status Diabetic peripheral neuropathy Mild transverse plane drifting of the second MPJ right without any clinical sign of capsular tear  Plan: Today reviewed the results of exam with patient today at 4 neurovascular status was adequate. I made aware that she has significant diabetic peripheral neuropathy. We also discussed the mild tendency for the second right toe to drift medially. At this time because of the minimal symptoms and minimal deformity I dispensed a silicone toe way shoe insert between the hallux and second right toe to wear an ongoing daily basis. Patient will return if the symptoms worsen or yearly  Reappoint at patient's request or yearly

## 2016-07-13 DIAGNOSIS — D2239 Melanocytic nevi of other parts of face: Secondary | ICD-10-CM | POA: Diagnosis not present

## 2016-07-13 DIAGNOSIS — D225 Melanocytic nevi of trunk: Secondary | ICD-10-CM | POA: Diagnosis not present

## 2016-07-13 DIAGNOSIS — L920 Granuloma annulare: Secondary | ICD-10-CM | POA: Diagnosis not present

## 2016-07-13 DIAGNOSIS — D1801 Hemangioma of skin and subcutaneous tissue: Secondary | ICD-10-CM | POA: Diagnosis not present

## 2016-07-13 DIAGNOSIS — D2272 Melanocytic nevi of left lower limb, including hip: Secondary | ICD-10-CM | POA: Diagnosis not present

## 2016-07-13 DIAGNOSIS — Z85820 Personal history of malignant melanoma of skin: Secondary | ICD-10-CM | POA: Diagnosis not present

## 2016-07-13 DIAGNOSIS — D2271 Melanocytic nevi of right lower limb, including hip: Secondary | ICD-10-CM | POA: Diagnosis not present

## 2016-07-13 DIAGNOSIS — L738 Other specified follicular disorders: Secondary | ICD-10-CM | POA: Diagnosis not present

## 2016-07-13 DIAGNOSIS — L821 Other seborrheic keratosis: Secondary | ICD-10-CM | POA: Diagnosis not present

## 2016-07-21 ENCOUNTER — Other Ambulatory Visit: Payer: Self-pay | Admitting: Interventional Cardiology

## 2016-07-22 ENCOUNTER — Encounter: Payer: Self-pay | Admitting: Interventional Cardiology

## 2016-08-06 NOTE — Progress Notes (Deleted)
Cardiology Office Note    Date:  08/06/2016   ID:  [name] 1955-10-27, MRN [id number]  PCP:  Sinda Du, MD  Cardiologist: Sinclair Grooms, MD   No chief complaint on file.   History of Present Illness:  [name] is a 61 y.o. female is followed for CAD, OSA, diabetes mellitus, essential hypertension.    Past Medical History:  Diagnosis Date  . Asthma   . Chest pain   . Coronary artery disease   . Diabetes mellitus   . GERD (gastroesophageal reflux disease)   . Hypercholesteremia   . Hypertension   . Panic attack   . Sleep apnea   . Thyroid disease     Past Surgical History:  Procedure Laterality Date  . ABDOMINAL HYSTERECTOMY    . BACK SURGERY    . CHOLECYSTECTOMY    . CORONARY ANGIOPLASTY WITH STENT PLACEMENT    . ELBOW SURGERY    . KNEE SURGERY    . LEFT HEART CATHETERIZATION WITH CORONARY ANGIOGRAM N/A 06/23/2012   Procedure: LEFT HEART CATHETERIZATION WITH CORONARY ANGIOGRAM;  Surgeon: Sinclair Grooms, MD;  Location: [facility] CATH LAB;  Service: Cardiovascular;  Laterality: N/A;    Current Medications: Outpatient Medications Prior to Visit  Medication Sig Dispense Refill  . albuterol (PROVENTIL HFA;VENTOLIN HFA) 108 (90 BASE) MCG/ACT inhaler Inhale 2 puffs into the lungs every 4 (four) hours as needed for wheezing.    Marland Kitchen amLODipine (NORVASC) 10 MG tablet TAKE 1 TABLET (10 MG TOTAL) BY MOUTH DAILY. 90 tablet 0  . aspirin EC 81 MG tablet Take 81 mg by mouth daily.    . budesonide-formoterol (SYMBICORT) 160-4.5 MCG/ACT inhaler Inhale 2 puffs into the lungs 2 (two) times daily.    . clopidogrel (PLAVIX) 75 MG tablet TAKE 1 TABLET BY MOUTH DAILY 90 tablet 0  . cyclobenzaprine (FLEXERIL) 10 MG tablet Take 10 mg by mouth 3 (three) times daily as needed for muscle spasms.    . diazepam (VALIUM) 10 MG tablet Take 10 mg by mouth daily.    . furosemide (LASIX) 40 MG tablet Take 40 mg by mouth 2 (two) times daily.      Marland Kitchen gabapentin (NEURONTIN) 300  MG capsule Take 300 mg by mouth at bedtime.    Marland Kitchen HYDROcodone-acetaminophen (NORCO) 10-325 MG per tablet Take 1 tablet by mouth every 6 (six) hours as needed for pain.    Marland Kitchen insulin lispro protamine-lispro (HUMALOG 75/25) (75-25) 100 UNIT/ML SUSP Inject 70-75 Units into the skin 2 (two) times daily with a meal. Takes 75 units in am and 70 units in pm    . isosorbide mononitrate (IMDUR) 60 MG 24 hr tablet Take 1 tablet (60 mg total) by mouth daily. 90 tablet 0  . levothyroxine (SYNTHROID, LEVOTHROID) 200 MCG tablet Take 200 mcg by mouth daily before breakfast.    . losartan (COZAAR) 100 MG tablet Take 100 mg by mouth daily.    . metformin (FORTAMET) 500 MG (OSM) 24 hr tablet Take 500 mg by mouth 2 (two) times daily with a meal.     . metoprolol (TOPROL-XL) 200 MG 24 hr tablet Take 200 mg by mouth daily.      . mirtazapine (REMERON) 30 MG tablet Take 30 mg by mouth at bedtime.    Marland Kitchen NITROSTAT 0.4 MG SL tablet PLACE 1 TABLET (0.4 MG TOTAL) UNDER THE TONGUE EVERY 5 (FIVE) MINUTES AS NEEDED FOR CHEST PAIN. 25 tablet 2  . omeprazole (  PRILOSEC) 20 MG capsule Take 20 mg by mouth daily.    . potassium chloride SA (K-DUR,KLOR-CON) 20 MEQ tablet Take 40 mEq by mouth daily.    . rosuvastatin (CRESTOR) 40 MG tablet Take 40 mg by mouth daily.      Marland Kitchen tiotropium (SPIRIVA) 18 MCG inhalation capsule Place 18 mcg into inhaler and inhale daily.     No facility-administered medications prior to visit.      Allergies:   Tape   Social History   Social History  . Marital status: Divorced    Spouse name: N/A  . Number of children: N/A  . Years of education: N/A   Social History Main Topics  . Smoking status: Never Smoker  . Smokeless tobacco: Never Used  . Alcohol use No  . Drug use: No  . Sexual activity: Not on file   Other Topics Concern  . Not on file   Social History Narrative  . No narrative on file     Family History:  The patient's ***family history includes COPD in her mother; Heart attack in  her father; Heart disease in her father.   ROS:   Please see the history of present illness.    ***  All other systems reviewed and are negative.   PHYSICAL EXAM:   VS:  There were no vitals taken for this visit.   GEN: Well nourished, well developed, in no acute distress  HEENT: normal  Neck: no JVD, carotid bruits, or masses Cardiac: ***RRR; no murmurs, rubs, or gallops,no edema  Respiratory:  clear to auscultation bilaterally, normal work of breathing GI: soft, nontender, nondistended, + BS MS: no deformity or atrophy  Skin: warm and dry, no rash Neuro:  Alert and Oriented x 3, Strength and sensation are intact Psych: euthymic mood, full affect  Wt Readings from Last 3 Encounters:  06/24/16 204 lb (92.5 kg)  07/05/15 224 lb (101.6 kg)  04/18/15 220 lb (99.8 kg)      Studies/Labs Reviewed:   EKG:  EKG  ***  Recent Labs: No results found for requested labs within last 8760 hours.   Lipid Panel No results found for: CHOL, TRIG, HDL, CHOLHDL, VLDL, LDLCALC, LDLDIRECT  Additional studies/ records that were reviewed today include:  ***    ASSESSMENT:    1. Coronary arteriosclerosis in native artery   2. Essential hypertension   3. Other hyperlipidemia   4. Abnormal cardiovascular stress test   5. Chest pain at rest      PLAN:  In order of problems listed above:  1. ***    Medication Adjustments/Labs and Tests Ordered: Current medicines are reviewed at length with the patient today.  Concerns regarding medicines are outlined above.  Medication changes, Labs and Tests ordered today are listed in the Patient Instructions below. There are no Patient Instructions on file for this visit.   Signed, Sinclair Grooms, MD  08/06/2016 7:32 PM    [address], [place], [place]  [address] Phone: [phone]; Fax: [phone]

## 2016-08-07 ENCOUNTER — Ambulatory Visit: Payer: Medicare Other | Admitting: Interventional Cardiology

## 2016-08-31 DIAGNOSIS — E119 Type 2 diabetes mellitus without complications: Secondary | ICD-10-CM | POA: Diagnosis not present

## 2016-08-31 DIAGNOSIS — I10 Essential (primary) hypertension: Secondary | ICD-10-CM | POA: Diagnosis not present

## 2016-08-31 DIAGNOSIS — I2510 Atherosclerotic heart disease of native coronary artery without angina pectoris: Secondary | ICD-10-CM | POA: Diagnosis not present

## 2016-09-13 NOTE — Progress Notes (Deleted)
Cardiology Office Note    Date:  09/13/2016   ID:  [name], [name] 07/12/1955, MRN [id number]  PCP:  Sinda Du, MD  Cardiologist: Sinclair Grooms, MD   No chief complaint on file.   History of Present Illness:  [name] is a 61 y.o. female  is followed for CAD, OSA, diabetes mellitus, essential hypertension.    Past Medical History:  Diagnosis Date  . Asthma   . Chest pain   . Coronary artery disease   . Diabetes mellitus   . GERD (gastroesophageal reflux disease)   . Hypercholesteremia   . Hypertension   . Panic attack   . Sleep apnea   . Thyroid disease     Past Surgical History:  Procedure Laterality Date  . ABDOMINAL HYSTERECTOMY    . BACK SURGERY    . CHOLECYSTECTOMY    . CORONARY ANGIOPLASTY WITH STENT PLACEMENT    . ELBOW SURGERY    . KNEE SURGERY    . LEFT HEART CATHETERIZATION WITH CORONARY ANGIOGRAM N/A 06/23/2012   Procedure: LEFT HEART CATHETERIZATION WITH CORONARY ANGIOGRAM;  Surgeon: Sinclair Grooms, MD;  Location: [facility] CATH LAB;  Service: Cardiovascular;  Laterality: N/A;    Current Medications: Outpatient Medications Prior to Visit  Medication Sig Dispense Refill  . albuterol (PROVENTIL HFA;VENTOLIN HFA) 108 (90 BASE) MCG/ACT inhaler Inhale 2 puffs into the lungs every 4 (four) hours as needed for wheezing.    Marland Kitchen amLODipine (NORVASC) 10 MG tablet TAKE 1 TABLET (10 MG TOTAL) BY MOUTH DAILY. 90 tablet 0  . aspirin EC 81 MG tablet Take 81 mg by mouth daily.    . budesonide-formoterol (SYMBICORT) 160-4.5 MCG/ACT inhaler Inhale 2 puffs into the lungs 2 (two) times daily.    . clopidogrel (PLAVIX) 75 MG tablet TAKE 1 TABLET BY MOUTH DAILY 90 tablet 0  . cyclobenzaprine (FLEXERIL) 10 MG tablet Take 10 mg by mouth 3 (three) times daily as needed for muscle spasms.    . diazepam (VALIUM) 10 MG tablet Take 10 mg by mouth daily.    . furosemide (LASIX) 40 MG tablet Take 40 mg by mouth 2 (two) times daily.      Marland Kitchen gabapentin (NEURONTIN)  300 MG capsule Take 300 mg by mouth at bedtime.    Marland Kitchen HYDROcodone-acetaminophen (NORCO) 10-325 MG per tablet Take 1 tablet by mouth every 6 (six) hours as needed for pain.    Marland Kitchen insulin lispro protamine-lispro (HUMALOG 75/25) (75-25) 100 UNIT/ML SUSP Inject 70-75 Units into the skin 2 (two) times daily with a meal. Takes 75 units in am and 70 units in pm    . isosorbide mononitrate (IMDUR) 60 MG 24 hr tablet Take 1 tablet (60 mg total) by mouth daily. 90 tablet 0  . levothyroxine (SYNTHROID, LEVOTHROID) 200 MCG tablet Take 200 mcg by mouth daily before breakfast.    . losartan (COZAAR) 100 MG tablet Take 100 mg by mouth daily.    . metformin (FORTAMET) 500 MG (OSM) 24 hr tablet Take 500 mg by mouth 2 (two) times daily with a meal.     . metoprolol (TOPROL-XL) 200 MG 24 hr tablet Take 200 mg by mouth daily.      . mirtazapine (REMERON) 30 MG tablet Take 30 mg by mouth at bedtime.    Marland Kitchen NITROSTAT 0.4 MG SL tablet PLACE 1 TABLET (0.4 MG TOTAL) UNDER THE TONGUE EVERY 5 (FIVE) MINUTES AS NEEDED FOR CHEST PAIN. 25 tablet 2  .  omeprazole (PRILOSEC) 20 MG capsule Take 20 mg by mouth daily.    . potassium chloride SA (K-DUR,KLOR-CON) 20 MEQ tablet Take 40 mEq by mouth daily.    . rosuvastatin (CRESTOR) 40 MG tablet Take 40 mg by mouth daily.      Marland Kitchen tiotropium (SPIRIVA) 18 MCG inhalation capsule Place 18 mcg into inhaler and inhale daily.     No facility-administered medications prior to visit.      Allergies:   Tape   Social History   Social History  . Marital status: Divorced    Spouse name: N/A  . Number of children: N/A  . Years of education: N/A   Social History Main Topics  . Smoking status: Never Smoker  . Smokeless tobacco: Never Used  . Alcohol use No  . Drug use: No  . Sexual activity: Not on file   Other Topics Concern  . Not on file   Social History Narrative  . No narrative on file     Family History:  The patient's ***family history includes COPD in her mother; Heart attack  in her father; Heart disease in her father.   ROS:   Please see the history of present illness.    ***  All other systems reviewed and are negative.   PHYSICAL EXAM:   VS:  There were no vitals taken for this visit.   GEN: Well nourished, well developed, in no acute distress  HEENT: normal  Neck: no JVD, carotid bruits, or masses Cardiac: ***RRR; no murmurs, rubs, or gallops,no edema  Respiratory:  clear to auscultation bilaterally, normal work of breathing GI: soft, nontender, nondistended, + BS MS: no deformity or atrophy  Skin: warm and dry, no rash Neuro:  Alert and Oriented x 3, Strength and sensation are intact Psych: euthymic mood, full affect  Wt Readings from Last 3 Encounters:  06/24/16 204 lb (92.5 kg)  07/05/15 224 lb (101.6 kg)  04/18/15 220 lb (99.8 kg)      Studies/Labs Reviewed:   EKG:  EKG  ***  Recent Labs: No results found for requested labs within last 8760 hours.   Lipid Panel No results found for: CHOL, TRIG, HDL, CHOLHDL, VLDL, LDLCALC, LDLDIRECT  Additional studies/ records that were reviewed today include:  ***    ASSESSMENT:    1. Coronary arteriosclerosis in native artery   2. Essential hypertension   3. Other hyperlipidemia   4. Abnormal cardiovascular stress test   5. Chest pain at rest      PLAN:  In order of problems listed above:  1. ***    Medication Adjustments/Labs and Tests Ordered: Current medicines are reviewed at length with the patient today.  Concerns regarding medicines are outlined above.  Medication changes, Labs and Tests ordered today are listed in the Patient Instructions below. There are no Patient Instructions on file for this visit.   Signed, Sinclair Grooms, MD  09/13/2016 7:20 PM    [facility] [address], [place], [place]  [address] Phone: [phone]; Fax: [phone]

## 2016-09-14 ENCOUNTER — Ambulatory Visit: Payer: Medicare Other | Admitting: Interventional Cardiology

## 2016-09-16 ENCOUNTER — Encounter: Payer: Self-pay | Admitting: Interventional Cardiology

## 2016-09-21 DIAGNOSIS — G4733 Obstructive sleep apnea (adult) (pediatric): Secondary | ICD-10-CM | POA: Diagnosis not present

## 2016-09-29 DIAGNOSIS — Z79899 Other long term (current) drug therapy: Secondary | ICD-10-CM | POA: Diagnosis not present

## 2016-09-29 DIAGNOSIS — S62112A Displaced fracture of triquetrum [cuneiform] bone, left wrist, initial encounter for closed fracture: Secondary | ICD-10-CM | POA: Diagnosis not present

## 2016-09-29 DIAGNOSIS — M25532 Pain in left wrist: Secondary | ICD-10-CM | POA: Diagnosis not present

## 2016-09-29 DIAGNOSIS — Z7902 Long term (current) use of antithrombotics/antiplatelets: Secondary | ICD-10-CM | POA: Diagnosis not present

## 2016-09-29 DIAGNOSIS — E039 Hypothyroidism, unspecified: Secondary | ICD-10-CM | POA: Diagnosis not present

## 2016-09-29 DIAGNOSIS — S42121A Displaced fracture of acromial process, right shoulder, initial encounter for closed fracture: Secondary | ICD-10-CM | POA: Diagnosis not present

## 2016-09-29 DIAGNOSIS — Z794 Long term (current) use of insulin: Secondary | ICD-10-CM | POA: Diagnosis not present

## 2016-09-29 DIAGNOSIS — Z7982 Long term (current) use of aspirin: Secondary | ICD-10-CM | POA: Diagnosis not present

## 2016-09-29 DIAGNOSIS — W010XXA Fall on same level from slipping, tripping and stumbling without subsequent striking against object, initial encounter: Secondary | ICD-10-CM | POA: Diagnosis not present

## 2016-09-29 DIAGNOSIS — S42122A Displaced fracture of acromial process, left shoulder, initial encounter for closed fracture: Secondary | ICD-10-CM | POA: Diagnosis not present

## 2016-09-29 DIAGNOSIS — Z955 Presence of coronary angioplasty implant and graft: Secondary | ICD-10-CM | POA: Diagnosis not present

## 2016-09-29 DIAGNOSIS — E119 Type 2 diabetes mellitus without complications: Secondary | ICD-10-CM | POA: Diagnosis not present

## 2016-09-29 DIAGNOSIS — I2510 Atherosclerotic heart disease of native coronary artery without angina pectoris: Secondary | ICD-10-CM | POA: Diagnosis not present

## 2016-09-29 DIAGNOSIS — I252 Old myocardial infarction: Secondary | ICD-10-CM | POA: Diagnosis not present

## 2016-10-05 DIAGNOSIS — S62102D Fracture of unspecified carpal bone, left wrist, subsequent encounter for fracture with routine healing: Secondary | ICD-10-CM | POA: Diagnosis not present

## 2016-10-12 DIAGNOSIS — S42125D Nondisplaced fracture of acromial process, left shoulder, subsequent encounter for fracture with routine healing: Secondary | ICD-10-CM | POA: Diagnosis not present

## 2016-10-12 DIAGNOSIS — M47812 Spondylosis without myelopathy or radiculopathy, cervical region: Secondary | ICD-10-CM | POA: Diagnosis not present

## 2016-10-12 DIAGNOSIS — Z87828 Personal history of other (healed) physical injury and trauma: Secondary | ICD-10-CM | POA: Diagnosis not present

## 2016-10-12 DIAGNOSIS — S4990XA Unspecified injury of shoulder and upper arm, unspecified arm, initial encounter: Secondary | ICD-10-CM | POA: Diagnosis not present

## 2016-10-19 DIAGNOSIS — I2510 Atherosclerotic heart disease of native coronary artery without angina pectoris: Secondary | ICD-10-CM | POA: Diagnosis not present

## 2016-10-19 DIAGNOSIS — S52501A Unspecified fracture of the lower end of right radius, initial encounter for closed fracture: Secondary | ICD-10-CM | POA: Diagnosis not present

## 2016-10-19 DIAGNOSIS — S4290XA Fracture of unspecified shoulder girdle, part unspecified, initial encounter for closed fracture: Secondary | ICD-10-CM | POA: Diagnosis not present

## 2016-10-19 DIAGNOSIS — I10 Essential (primary) hypertension: Secondary | ICD-10-CM | POA: Diagnosis not present

## 2016-11-06 DIAGNOSIS — M7522 Bicipital tendinitis, left shoulder: Secondary | ICD-10-CM | POA: Diagnosis not present

## 2016-11-06 DIAGNOSIS — Z87828 Personal history of other (healed) physical injury and trauma: Secondary | ICD-10-CM | POA: Diagnosis not present

## 2016-11-18 DIAGNOSIS — Z79891 Long term (current) use of opiate analgesic: Secondary | ICD-10-CM | POA: Diagnosis not present

## 2016-11-20 ENCOUNTER — Ambulatory Visit: Payer: Medicare Other | Admitting: Interventional Cardiology

## 2016-11-26 ENCOUNTER — Encounter: Payer: Self-pay | Admitting: Interventional Cardiology

## 2016-12-07 DIAGNOSIS — S62115D Nondisplaced fracture of triquetrum [cuneiform] bone, left wrist, subsequent encounter for fracture with routine healing: Secondary | ICD-10-CM | POA: Diagnosis not present

## 2016-12-07 DIAGNOSIS — S42125D Nondisplaced fracture of acromial process, left shoulder, subsequent encounter for fracture with routine healing: Secondary | ICD-10-CM | POA: Diagnosis not present

## 2017-02-16 DIAGNOSIS — G4733 Obstructive sleep apnea (adult) (pediatric): Secondary | ICD-10-CM | POA: Diagnosis not present

## 2017-03-02 DIAGNOSIS — I2510 Atherosclerotic heart disease of native coronary artery without angina pectoris: Secondary | ICD-10-CM | POA: Diagnosis not present

## 2017-03-02 DIAGNOSIS — E1140 Type 2 diabetes mellitus with diabetic neuropathy, unspecified: Secondary | ICD-10-CM | POA: Diagnosis not present

## 2017-03-02 DIAGNOSIS — I10 Essential (primary) hypertension: Secondary | ICD-10-CM | POA: Diagnosis not present

## 2017-03-10 ENCOUNTER — Ambulatory Visit: Payer: Medicare Other | Admitting: Interventional Cardiology

## 2017-03-19 DIAGNOSIS — G4733 Obstructive sleep apnea (adult) (pediatric): Secondary | ICD-10-CM | POA: Diagnosis not present

## 2017-03-23 DIAGNOSIS — M47812 Spondylosis without myelopathy or radiculopathy, cervical region: Secondary | ICD-10-CM | POA: Diagnosis not present

## 2017-04-05 DIAGNOSIS — E1165 Type 2 diabetes mellitus with hyperglycemia: Secondary | ICD-10-CM | POA: Diagnosis not present

## 2017-04-05 DIAGNOSIS — E7800 Pure hypercholesterolemia, unspecified: Secondary | ICD-10-CM | POA: Diagnosis not present

## 2017-04-05 DIAGNOSIS — E039 Hypothyroidism, unspecified: Secondary | ICD-10-CM | POA: Diagnosis not present

## 2017-04-05 DIAGNOSIS — I10 Essential (primary) hypertension: Secondary | ICD-10-CM | POA: Diagnosis not present

## 2017-04-07 DIAGNOSIS — M19012 Primary osteoarthritis, left shoulder: Secondary | ICD-10-CM | POA: Diagnosis not present

## 2017-04-07 DIAGNOSIS — M1812 Unilateral primary osteoarthritis of first carpometacarpal joint, left hand: Secondary | ICD-10-CM | POA: Diagnosis not present

## 2017-04-07 DIAGNOSIS — M19032 Primary osteoarthritis, left wrist: Secondary | ICD-10-CM | POA: Diagnosis not present

## 2017-04-07 DIAGNOSIS — M19132 Post-traumatic osteoarthritis, left wrist: Secondary | ICD-10-CM | POA: Diagnosis not present

## 2017-04-15 ENCOUNTER — Ambulatory Visit: Payer: Medicare Other | Admitting: Cardiology

## 2017-04-16 DIAGNOSIS — G4733 Obstructive sleep apnea (adult) (pediatric): Secondary | ICD-10-CM | POA: Diagnosis not present

## 2017-05-10 ENCOUNTER — Other Ambulatory Visit (HOSPITAL_COMMUNITY): Payer: Self-pay | Admitting: Pulmonary Disease

## 2017-05-10 ENCOUNTER — Telehealth: Payer: Self-pay | Admitting: Interventional Cardiology

## 2017-05-10 DIAGNOSIS — Z1231 Encounter for screening mammogram for malignant neoplasm of breast: Secondary | ICD-10-CM

## 2017-05-10 NOTE — Telephone Encounter (Signed)
LVM for pt to return call

## 2017-05-10 NOTE — Telephone Encounter (Signed)
New Message    Pt c/o of Chest Pain: STAT if CP now or developed within 24 hours  1. Are you having CP right now? No  2. Are you experiencing any other symptoms (ex. SOB, nausea, vomiting, sweating)? Shortness of breath  3. How long have you been experiencing CP? About a month  4. Is your CP continuous or coming and going? Coming and going   5. Have you taken Nitroglycerin? Yes, taken last about two weeks ago   ?

## 2017-05-11 NOTE — Telephone Encounter (Signed)
Spoke with pt around 1:15pm and she states she has been having intermittent CP x 1 month.  Gets SOB with pain that feels like she's being smothered and has back pain.  Last occurred over the weekend when she was carrying a laundry basket from her apt to her car.  Only occurs with exertion.  Nitro does not help relieve sx.  Only relieves after about 5 mins of rest.  Pt denies any pain at this time.  Spoke with pt about getting her scheduled.  Pt has transportation issues and said she can't get to the office until Friday or Tuesday.  No appts available in this office on those 2 days.  Offered appt Tuesday at [place] office and pt declined stating she doesn't think she will be able to find that office.  Pt inquired about an appt on the 22nd or 23rd.  Advised pt she really shouldn't wait that long with the sx she is having.  Pt agreed to schedule an appt for tomorrow and find a ride to get here.  Advised pt to call back if she needs to reschedule and to report to ER if sx return.  Pt verbalized understanding and was in agreement with this plan.

## 2017-05-11 NOTE — Telephone Encounter (Signed)
Left message to call back  

## 2017-05-11 NOTE — Telephone Encounter (Signed)
Follow up  ° ° °Patient is returning call.  °

## 2017-05-12 ENCOUNTER — Ambulatory Visit: Payer: Medicare Other | Admitting: Physician Assistant

## 2017-05-13 ENCOUNTER — Ambulatory Visit (HOSPITAL_COMMUNITY): Payer: Medicare Other

## 2017-05-19 DIAGNOSIS — S63502D Unspecified sprain of left wrist, subsequent encounter: Secondary | ICD-10-CM | POA: Diagnosis not present

## 2017-05-19 DIAGNOSIS — S43102D Unspecified dislocation of left acromioclavicular joint, subsequent encounter: Secondary | ICD-10-CM | POA: Diagnosis not present

## 2017-05-25 DIAGNOSIS — Z79891 Long term (current) use of opiate analgesic: Secondary | ICD-10-CM | POA: Diagnosis not present

## 2017-05-31 ENCOUNTER — Ambulatory Visit (HOSPITAL_COMMUNITY): Payer: Medicare Other

## 2017-06-01 ENCOUNTER — Other Ambulatory Visit: Payer: Self-pay | Admitting: Interventional Cardiology

## 2017-06-02 ENCOUNTER — Other Ambulatory Visit: Payer: Self-pay | Admitting: Interventional Cardiology

## 2017-06-02 NOTE — Telephone Encounter (Signed)
Loren Racer, LPN 6 minutes ago (14:78 AM)      Can send a 30 day supply with note to make appt or will need to get further refills from PCP.  Thanks!      Documentation

## 2017-06-02 NOTE — Telephone Encounter (Signed)
Can send a 30 day supply with note to make appt or will need to get further refills from PCP.  Thanks!

## 2017-06-08 DIAGNOSIS — E785 Hyperlipidemia, unspecified: Secondary | ICD-10-CM | POA: Diagnosis not present

## 2017-06-08 DIAGNOSIS — I2510 Atherosclerotic heart disease of native coronary artery without angina pectoris: Secondary | ICD-10-CM | POA: Diagnosis not present

## 2017-06-08 DIAGNOSIS — E039 Hypothyroidism, unspecified: Secondary | ICD-10-CM | POA: Diagnosis not present

## 2017-06-08 DIAGNOSIS — K219 Gastro-esophageal reflux disease without esophagitis: Secondary | ICD-10-CM | POA: Diagnosis not present

## 2017-06-08 DIAGNOSIS — I10 Essential (primary) hypertension: Secondary | ICD-10-CM | POA: Diagnosis not present

## 2017-06-08 DIAGNOSIS — M542 Cervicalgia: Secondary | ICD-10-CM | POA: Diagnosis not present

## 2017-06-08 DIAGNOSIS — M150 Primary generalized (osteo)arthritis: Secondary | ICD-10-CM | POA: Diagnosis not present

## 2017-06-08 DIAGNOSIS — R7309 Other abnormal glucose: Secondary | ICD-10-CM | POA: Diagnosis not present

## 2017-06-08 DIAGNOSIS — E119 Type 2 diabetes mellitus without complications: Secondary | ICD-10-CM | POA: Diagnosis not present

## 2017-06-08 DIAGNOSIS — Z0000 Encounter for general adult medical examination without abnormal findings: Secondary | ICD-10-CM | POA: Diagnosis not present

## 2017-06-10 ENCOUNTER — Telehealth: Payer: Self-pay | Admitting: Interventional Cardiology

## 2017-06-10 ENCOUNTER — Telehealth: Payer: Self-pay | Admitting: Cardiology

## 2017-06-10 ENCOUNTER — Other Ambulatory Visit: Payer: Self-pay

## 2017-06-10 ENCOUNTER — Emergency Department (HOSPITAL_COMMUNITY): Payer: Medicare Other

## 2017-06-10 ENCOUNTER — Inpatient Hospital Stay (HOSPITAL_COMMUNITY)
Admission: EM | Admit: 2017-06-10 | Discharge: 2017-06-12 | DRG: 251 | Disposition: A | Discharge status: Home / Self Care | Payer: Medicare Other | Attending: Cardiology | Admitting: Cardiology

## 2017-06-10 ENCOUNTER — Encounter (HOSPITAL_COMMUNITY): Payer: Self-pay | Admitting: Emergency Medicine

## 2017-06-10 DIAGNOSIS — E079 Disorder of thyroid, unspecified: Secondary | ICD-10-CM | POA: Diagnosis present

## 2017-06-10 DIAGNOSIS — I4510 Unspecified right bundle-branch block: Secondary | ICD-10-CM | POA: Diagnosis not present

## 2017-06-10 DIAGNOSIS — I25110 Atherosclerotic heart disease of native coronary artery with unstable angina pectoris: Secondary | ICD-10-CM | POA: Diagnosis not present

## 2017-06-10 DIAGNOSIS — K219 Gastro-esophageal reflux disease without esophagitis: Secondary | ICD-10-CM | POA: Diagnosis present

## 2017-06-10 DIAGNOSIS — Z9119 Patient's noncompliance with other medical treatment and regimen: Secondary | ICD-10-CM

## 2017-06-10 DIAGNOSIS — Z7902 Long term (current) use of antithrombotics/antiplatelets: Secondary | ICD-10-CM | POA: Diagnosis not present

## 2017-06-10 DIAGNOSIS — R55 Syncope and collapse: Secondary | ICD-10-CM

## 2017-06-10 DIAGNOSIS — Z9861 Coronary angioplasty status: Secondary | ICD-10-CM

## 2017-06-10 DIAGNOSIS — E785 Hyperlipidemia, unspecified: Secondary | ICD-10-CM | POA: Diagnosis not present

## 2017-06-10 DIAGNOSIS — E039 Hypothyroidism, unspecified: Secondary | ICD-10-CM | POA: Diagnosis present

## 2017-06-10 DIAGNOSIS — Z7982 Long term (current) use of aspirin: Secondary | ICD-10-CM | POA: Diagnosis not present

## 2017-06-10 DIAGNOSIS — Z7952 Long term (current) use of systemic steroids: Secondary | ICD-10-CM

## 2017-06-10 DIAGNOSIS — Z79899 Other long term (current) drug therapy: Secondary | ICD-10-CM

## 2017-06-10 DIAGNOSIS — J45909 Unspecified asthma, uncomplicated: Secondary | ICD-10-CM | POA: Diagnosis present

## 2017-06-10 DIAGNOSIS — Z794 Long term (current) use of insulin: Secondary | ICD-10-CM

## 2017-06-10 DIAGNOSIS — I340 Nonrheumatic mitral (valve) insufficiency: Secondary | ICD-10-CM | POA: Diagnosis not present

## 2017-06-10 DIAGNOSIS — T82858A Stenosis of vascular prosthetic devices, implants and grafts, initial encounter: Secondary | ICD-10-CM | POA: Diagnosis present

## 2017-06-10 DIAGNOSIS — Z8249 Family history of ischemic heart disease and other diseases of the circulatory system: Secondary | ICD-10-CM | POA: Diagnosis not present

## 2017-06-10 DIAGNOSIS — N179 Acute kidney failure, unspecified: Secondary | ICD-10-CM | POA: Diagnosis present

## 2017-06-10 DIAGNOSIS — E119 Type 2 diabetes mellitus without complications: Secondary | ICD-10-CM | POA: Diagnosis present

## 2017-06-10 DIAGNOSIS — Z90710 Acquired absence of both cervix and uterus: Secondary | ICD-10-CM

## 2017-06-10 DIAGNOSIS — E7800 Pure hypercholesterolemia, unspecified: Secondary | ICD-10-CM | POA: Diagnosis present

## 2017-06-10 DIAGNOSIS — R079 Chest pain, unspecified: Secondary | ICD-10-CM | POA: Diagnosis not present

## 2017-06-10 DIAGNOSIS — I10 Essential (primary) hypertension: Secondary | ICD-10-CM | POA: Diagnosis present

## 2017-06-10 DIAGNOSIS — N289 Disorder of kidney and ureter, unspecified: Secondary | ICD-10-CM

## 2017-06-10 DIAGNOSIS — G4730 Sleep apnea, unspecified: Secondary | ICD-10-CM | POA: Diagnosis present

## 2017-06-10 DIAGNOSIS — G8929 Other chronic pain: Secondary | ICD-10-CM | POA: Diagnosis not present

## 2017-06-10 DIAGNOSIS — Z955 Presence of coronary angioplasty implant and graft: Secondary | ICD-10-CM | POA: Diagnosis not present

## 2017-06-10 DIAGNOSIS — G4733 Obstructive sleep apnea (adult) (pediatric): Secondary | ICD-10-CM | POA: Diagnosis present

## 2017-06-10 DIAGNOSIS — I951 Orthostatic hypotension: Secondary | ICD-10-CM | POA: Diagnosis present

## 2017-06-10 DIAGNOSIS — F410 Panic disorder [episodic paroxysmal anxiety] without agoraphobia: Secondary | ICD-10-CM | POA: Diagnosis present

## 2017-06-10 DIAGNOSIS — Y832 Surgical operation with anastomosis, bypass or graft as the cause of abnormal reaction of the patient, or of later complication, without mention of misadventure at the time of the procedure: Secondary | ICD-10-CM | POA: Diagnosis present

## 2017-06-10 DIAGNOSIS — E860 Dehydration: Secondary | ICD-10-CM | POA: Diagnosis present

## 2017-06-10 DIAGNOSIS — I2510 Atherosclerotic heart disease of native coronary artery without angina pectoris: Secondary | ICD-10-CM

## 2017-06-10 DIAGNOSIS — Z79890 Hormone replacement therapy (postmenopausal): Secondary | ICD-10-CM | POA: Diagnosis not present

## 2017-06-10 DIAGNOSIS — R0789 Other chest pain: Secondary | ICD-10-CM | POA: Diagnosis not present

## 2017-06-10 DIAGNOSIS — M545 Low back pain: Secondary | ICD-10-CM | POA: Diagnosis present

## 2017-06-10 LAB — CBC
HCT: 39.8 % (ref 36.0–46.0)
Hemoglobin: 13.5 g/dL (ref 12.0–15.0)
MCH: 30.4 pg (ref 26.0–34.0)
MCHC: 33.9 g/dL (ref 30.0–36.0)
MCV: 89.6 fL (ref 78.0–100.0)
Platelets: 161 10*3/uL (ref 150–400)
RBC: 4.44 MIL/uL (ref 3.87–5.11)
RDW: 12.6 % (ref 11.5–15.5)
WBC: 6 10*3/uL (ref 4.0–10.5)

## 2017-06-10 LAB — CK: Total CK: 31 U/L — ABNORMAL LOW (ref 38–234)

## 2017-06-10 LAB — BASIC METABOLIC PANEL
Anion gap: 12 (ref 5–15)
BUN: 21 mg/dL — ABNORMAL HIGH (ref 6–20)
CO2: 24 mmol/L (ref 22–32)
Calcium: 8.8 mg/dL — ABNORMAL LOW (ref 8.9–10.3)
Chloride: 99 mmol/L — ABNORMAL LOW (ref 101–111)
Creatinine, Ser: 1.54 mg/dL — ABNORMAL HIGH (ref 0.44–1.00)
GFR calc Af Amer: 41 mL/min — ABNORMAL LOW (ref >60)
GFR calc non Af Amer: 35 mL/min — ABNORMAL LOW (ref >60)
Glucose, Bld: 296 mg/dL — ABNORMAL HIGH (ref 65–99)
Potassium: 3.8 mmol/L (ref 3.5–5.1)
Sodium: 135 mmol/L (ref 135–145)

## 2017-06-10 LAB — PROTIME-INR
INR: 0.87
Prothrombin Time: 11.8 seconds (ref 11.4–15.2)

## 2017-06-10 LAB — TSH: TSH: 0.057 u[IU]/mL — ABNORMAL LOW (ref 0.350–4.500)

## 2017-06-10 LAB — TROPONIN I
Troponin I: <0.03 ng/mL (ref <0.03)
Troponin I: <0.03 ng/mL (ref <0.03)

## 2017-06-10 LAB — MAGNESIUM: Magnesium: 1.5 mg/dL — ABNORMAL LOW (ref 1.7–2.4)

## 2017-06-10 LAB — PHOSPHORUS: Phosphorus: 3.8 mg/dL (ref 2.5–4.6)

## 2017-06-10 MED ORDER — ALPRAZOLAM 0.25 MG PO TABS: 0.2500 mg | ORAL_TABLET | Freq: Two times a day (BID) | ORAL | Status: DC | PRN

## 2017-06-10 MED ORDER — ONDANSETRON HCL 4 MG/2ML IJ SOLN: 4.0000 mg | Freq: Four times a day (QID) | INTRAMUSCULAR | Status: DC | PRN

## 2017-06-10 MED ORDER — SODIUM CHLORIDE 0.9 % IV SOLN: INTRAVENOUS | Status: DC

## 2017-06-10 MED ORDER — ENSURE ENLIVE PO LIQD
237.0000 mL | Freq: Two times a day (BID) | ORAL | Status: DC
  Administered 2017-06-11: 237 mL via ORAL
  Filled 2017-06-10 (×4): qty 237

## 2017-06-10 MED ORDER — ENOXAPARIN SODIUM 40 MG/0.4ML ~~LOC~~ SOLN
40.0000 mg | SUBCUTANEOUS | Status: DC
  Administered 2017-06-11: 40 mg via SUBCUTANEOUS
  Filled 2017-06-10: qty 0.4

## 2017-06-10 MED ORDER — ENOXAPARIN SODIUM 40 MG/0.4ML ~~LOC~~ SOLN: 40.0000 mg | SUBCUTANEOUS | Status: DC

## 2017-06-10 MED ORDER — INSULIN ASPART 100 UNIT/ML ~~LOC~~ SOLN
0.0000 [IU] | Freq: Three times a day (TID) | SUBCUTANEOUS | Status: DC
  Administered 2017-06-11: 2 [IU] via SUBCUTANEOUS
  Administered 2017-06-11 – 2017-06-12 (×2): 5 [IU] via SUBCUTANEOUS

## 2017-06-10 MED ORDER — NITROGLYCERIN 0.4 MG SL SUBL
0.4000 mg | SUBLINGUAL_TABLET | Freq: Once | SUBLINGUAL | Status: AC
  Administered 2017-06-10: 0.4 mg via SUBLINGUAL
  Filled 2017-06-10: qty 1

## 2017-06-10 MED ORDER — ACETAMINOPHEN 325 MG PO TABS
650.0000 mg | ORAL_TABLET | ORAL | Status: DC | PRN
  Administered 2017-06-12 (×2): 650 mg via ORAL
  Filled 2017-06-10 (×2): qty 2

## 2017-06-10 MED ORDER — SODIUM CHLORIDE 0.45 % IV SOLN
INTRAVENOUS | Status: DC
  Administered 2017-06-11: 01:00:00 via INTRAVENOUS

## 2017-06-10 MED ORDER — MAGNESIUM SULFATE 4 GM/100ML IV SOLN
4.0000 g | Freq: Once | INTRAVENOUS | Status: AC
  Administered 2017-06-11: 4 g via INTRAVENOUS
  Filled 2017-06-10: qty 100

## 2017-06-10 MED ORDER — SODIUM CHLORIDE 0.9 % IV BOLUS
500.0000 mL | Freq: Once | INTRAVENOUS | Status: AC
  Administered 2017-06-10: 500 mL via INTRAVENOUS

## 2017-06-10 NOTE — ED Triage Notes (Signed)
Pt reports centralized chest pain that radiates straight into her back. Pt also states she passed out around 11am.

## 2017-06-10 NOTE — Telephone Encounter (Signed)
Left message to call back  

## 2017-06-10 NOTE — ED Notes (Signed)
O2 applied via [place] at 2 L/M due to RA sats 91-92%

## 2017-06-10 NOTE — H&P (Signed)
History and Physical    [name]:[id number] DOB: [birth year] DOA: 06/10/2017  PCP: Sinda Du, MD   Patient coming from: Home.  I have personally briefly reviewed patient's old medical records in [place]  Chief Complaint: Chest pain.  HPI: [name] is a 62 y.o. female with medical history significant of asthma, CAD, type 2 diabetes, GERD, hyperlipidemia, hypertension, panic attack, sleep apnea thyroid disease who is coming to the emergency department due to chest pain for 1 week.  Per patient, since about 2 weeks ago she has been getting lightheaded.  This has been getting progressively worse since then.  Since about a week ago, the patient states she has been having pressure-like central chest pain, radiated to her back, worsened by exertion associated with palpitations, nausea and mild dyspnea.  The pain gets worse with rest.  She denies diaphoresis, PND orthopnea.  She gets frequent lower extremity edema.  Today, the patient woke up with mild abdominal pain and feeling more lightheaded and nauseous.  She has had 2 episodes of emesis this week, last one this morning.  She had also had 3 episodes of diarrhea today.  She denies fever, chills, headache, sore throat, productive cough, constipation, melena, hematochezia, dysuria, frequency or hematuria.  Denies blurred vision, polyuria, polydipsia, polyphagia, heat or cold intolerance.  No itching or pruritus.  ED Course: Initial vital signs temperature 98.2 F, pulse 91, respirations 16, blood pressure 123/72 mmHg and O2 sat 97% on room air.  The patient was given a 500 mL normal saline bolus and sublingual nitroglycerin in the ED.  Her EKG sinus rhythm with old anterior infarct, now showing a new RBBB.  First troponin was normal.  CBC was normal.  PT/INR were normal.  Sodium 135, potassium 3.8, chloride 99 and CO2 24 mmol/L.  BUN was 21, creatinine 1.54, calcium 8.8, magnesium 1.5 and phosphorus 3.8 mg/dL.  Her TSH  was 0.0 57 uIU/mL.  Her chest radiograph did not show any acute cardiopulmonary pathology.  Please see films and reports for further detail.  Review of Systems: As per HPI otherwise 10 point review of systems negative.   Past Medical History:  Diagnosis Date  . Asthma   . Chest pain   . Coronary artery disease   . Diabetes mellitus   . GERD (gastroesophageal reflux disease)   . Hypercholesteremia   . Hypertension   . Panic attack   . Sleep apnea   . Thyroid disease     Past Surgical History:  Procedure Laterality Date  . ABDOMINAL HYSTERECTOMY    . BACK SURGERY    . CHOLECYSTECTOMY    . CORONARY ANGIOPLASTY WITH STENT PLACEMENT    . ELBOW SURGERY    . KNEE SURGERY    . LEFT HEART CATHETERIZATION WITH CORONARY ANGIOGRAM N/A 06/23/2012   Procedure: LEFT HEART CATHETERIZATION WITH CORONARY ANGIOGRAM;  Surgeon: Sinclair Grooms, MD;  Location: [facility] CATH LAB;  Service: Cardiovascular;  Laterality: N/A;     reports that she has never smoked. She has never used smokeless tobacco. She reports that she does not drink alcohol or use drugs.  Allergies  Allergen Reactions  . Tape Other (See Comments)    Adhesive breaks me out (rash)    Family History  Problem Relation Age of Onset  . COPD Mother   . Heart disease Father   . Heart attack Father    Prior to Admission medications   Medication Sig Start Date End Date Taking?  Authorizing Provider  acyclovir (ZOVIRAX) 800 MG tablet Take 1 tablet by mouth 5 (five) times daily. 03/25/17   [provider]  albuterol (PROVENTIL HFA;VENTOLIN HFA) 108 (90 BASE) MCG/ACT inhaler Inhale 2 puffs into the lungs every 4 (four) hours as needed for wheezing.    [provider]  amLODipine (NORVASC) 10 MG tablet TAKE 1 TABLET (10 MG TOTAL) BY MOUTH DAILY. 07/21/16   Belva Crome, MD  aspirin EC 81 MG tablet Take 81 mg by mouth daily.    [provider]  budesonide-formoterol (SYMBICORT) 160-4.5 MCG/ACT inhaler Inhale 2 puffs  into the lungs 2 (two) times daily.    [provider]  Cholecalciferol (VITAMIN D3 SUPER STRENGTH) 2000 units TABS Take 1 tablet by mouth daily.    [provider]  citalopram (CELEXA) 40 MG tablet Take 40 mg by mouth daily. 05/05/17   [provider]  clopidogrel (PLAVIX) 75 MG tablet Take 1 tablet (75 mg total) by mouth daily. Please keep upcoming appt for future refills. Thank you 06/03/17   Belva Crome, MD  cyclobenzaprine (FLEXERIL) 10 MG tablet Take 10 mg by mouth 3 (three) times daily as needed for muscle spasms.    [provider]  cyclobenzaprine (FLEXERIL) 10 MG tablet Take by mouth.    [provider]  diazepam (VALIUM) 10 MG tablet Take 10 mg by mouth daily.    [provider]  diazepam (VALIUM) 2 MG tablet Take by mouth.    [provider]  diclofenac sodium (VOLTAREN) 1 % GEL APPLY TO AFFECTED AREA(S) THREE TIMES DAILY 04/08/17   [provider]  fluconazole (DIFLUCAN) 150 MG tablet See admin instructions. 05/20/17   [provider]  furosemide (LASIX) 40 MG tablet Take 40 mg by mouth 2 (two) times daily.      [provider]  furosemide (LASIX) 40 MG tablet Take by mouth.    [provider]  gabapentin (NEURONTIN) 300 MG capsule Take 300 mg by mouth at bedtime.    [provider]  HYDROcodone-acetaminophen (NORCO) 10-325 MG per tablet Take 1 tablet by mouth every 6 (six) hours as needed for pain.    [provider]  HYDROcodone-acetaminophen (NORCO) 10-325 MG tablet Take by mouth.    [provider]  hydrOXYzine (ATARAX/VISTARIL) 25 MG tablet Take 1 tablet by mouth 2 (two) times daily. 05/05/17   [provider]  insulin lispro protamine-lispro (HUMALOG 75/25) (75-25) 100 UNIT/ML SUSP Inject 70-75 Units into the skin 2 (two) times daily with a meal. Takes 75 units in am and 70 units in pm    [provider]  isosorbide mononitrate (IMDUR) 60 MG 24  hr tablet Take 1 tablet (60 mg total) by mouth daily. 04/11/13   Belva Crome, MD  levothyroxine (SYNTHROID, LEVOTHROID) 125 MCG tablet Take 250 mcg by mouth daily. 05/27/17   [provider]  levothyroxine (SYNTHROID, LEVOTHROID) 200 MCG tablet Take 200 mcg by mouth daily before breakfast.    [provider]  losartan (COZAAR) 100 MG tablet Take 100 mg by mouth daily.    [provider]  meclizine (ANTIVERT) 25 MG tablet Take 1 tablet by mouth 4 (four) times daily as needed. 04/12/17   [provider]  metformin (FORTAMET) 500 MG (OSM) 24 hr tablet Take 500 mg by mouth 2 (two) times daily with a meal.     [provider]  metFORMIN (GLUCOPHAGE-XR) 500 MG 24 hr tablet Take 1,000 mg by  mouth 2 (two) times daily. 05/16/17   [provider]  metoprolol (TOPROL-XL) 200 MG 24 hr tablet Take 200 mg by mouth daily.      [provider]  mirtazapine (REMERON) 30 MG tablet Take 30 mg by mouth at bedtime.    [provider]  NITROSTAT 0.4 MG SL tablet PLACE 1 TABLET (0.4 MG TOTAL) UNDER THE TONGUE EVERY 5 (FIVE) MINUTES AS NEEDED FOR CHEST PAIN. 03/19/15   Belva Crome, MD  omeprazole (PRILOSEC) 20 MG capsule Take 20 mg by mouth daily.    [provider]  ondansetron (ZOFRAN) 4 MG tablet Take 4 mg by mouth 4 (four) times daily. 05/22/17   [provider]  oxyCODONE (OXYCONTIN) 10 mg 12 hr tablet Take 1 tablet by mouth every 12 (twelve) hours.    [provider]  penicillin v potassium (VEETID) 500 MG tablet TAKE 2 TABLETS BY MOUTH NOW THEN 1 TABLET FOUR TIMES DAILY FOR SEVEN DAYS 04/29/17   [provider]  potassium chloride SA (K-DUR,KLOR-CON) 20 MEQ tablet Take 40 mEq by mouth daily.    [provider]  rosuvastatin (CRESTOR) 40 MG tablet Take 40 mg by mouth daily.      [provider]  tiotropium (SPIRIVA) 18 MCG inhalation capsule Place 18 mcg into inhaler and inhale daily.     [provider]  tobramycin-dexamethasone [facility]) ophthalmic solution     [provider]    Physical Exam: Vitals:   06/10/17 2145 06/10/17 2200 06/10/17 2215 06/10/17 2230  BP:  129/64  140/69  Pulse: 91 86 87 88  Resp: 19 14 15 14   Temp:      TempSrc:      SpO2: 96% 95% 96% 95%  Weight:      Height:        Constitutional: NAD, calm, comfortable Eyes: PERRL, lids and conjunctivae normal ENMT: Mucous membranes are dry. Posterior pharynx clear of any exudate or lesions. Neck: normal, supple, no masses, no thyromegaly Respiratory: clear to auscultation bilaterally, no wheezing, no crackles. Normal respiratory effort. No accessory muscle use.  Cardiovascular: Regular rate and rhythm, no murmurs / rubs / gallops. No extremity edema. 2+ pedal pulses. No carotid bruits.  Abdomen: Bowel sounds positive. Mild epigastric tenderness, no guarding/rebound/masses palpated. No hepatosplenomegaly.  Musculoskeletal: Left wrist brace in place.  No clubbing / cyanosis. Good ROM, no contractures. Normal muscle tone.  Skin: Hyperpigmented macules particularly on trunk. Neurologic: CN 2-12 grossly intact. Sensation intact, DTR normal. Strength 5/5 in all 4.  Psychiatric: Normal judgment and insight. Alert and oriented x 4.  Normal mood.   Labs on Admission: I have personally reviewed following labs and imaging studies  CBC: Recent Labs  Lab 06/10/17 2013  WBC 6.0  HGB 13.5  HCT 39.8  MCV 89.6  PLT 419   Basic Metabolic Panel: Recent Labs  Lab 06/10/17 2013  NA 135  K 3.8  CL 99*  CO2 24  GLUCOSE 296*  BUN 21*  CREATININE 1.54*  CALCIUM 8.8*  MG 1.5*  PHOS 3.8   GFR: Estimated Creatinine Clearance: 39.2 mL/min (A) (by C-G formula based on SCr of 1.54 mg/dL (H)). Liver Function Tests: No results for input(s): AST, ALT, ALKPHOS, BILITOT, PROT, ALBUMIN in the last 168 hours. No results for input(s): LIPASE, AMYLASE in the last 168 hours. No results for  input(s): AMMONIA in the last 168 hours. Coagulation Profile: Recent Labs  Lab 06/10/17 2013  INR 0.87   Cardiac  Enzymes: Recent Labs  Lab 06/10/17 2013 06/10/17 2252  CKTOTAL  --  31*  TROPONINI <0.03 <0.03   BNP (last 3 results) No results for input(s): PROBNP in the last 8760 hours. HbA1C: No results for input(s): HGBA1C in the last 72 hours. CBG: No results for input(s): GLUCAP in the last 168 hours. Lipid Profile: No results for input(s): CHOL, HDL, LDLCALC, TRIG, CHOLHDL, LDLDIRECT in the last 72 hours. Thyroid Function Tests: Recent Labs    06/10/17 2013  TSH 0.057*   Anemia Panel: No results for input(s): VITAMINB12, FOLATE, FERRITIN, TIBC, IRON, RETICCTPCT in the last 72 hours. Urine analysis: No results found for: COLORURINE, APPEARANCEUR, LABSPEC, PHURINE, GLUCOSEU, HGBUR, BILIRUBINUR, KETONESUR, PROTEINUR, UROBILINOGEN, NITRITE, LEUKOCYTESUR  Radiological Exams on Admission: Dg Chest 2 View  Result Date: 06/10/2017 CLINICAL DATA:  Chest pain EXAM: CHEST - 2 VIEW COMPARISON:  08/02/2014 FINDINGS: The heart size and mediastinal contours are within normal limits. Both lungs are clear. The visualized skeletal structures are unremarkable. IMPRESSION: No active cardiopulmonary disease. Electronically Signed   By: Ulyses Jarred M.D.   On: 06/10/2017 21:24    EKG: Independently reviewed. Vent. rate 91 BPM PR interval * ms QRS duration 145 ms QT/QTc 408/502 ms P-R-T axes 40 -63 32 Sinus rhythm Right bundle branch block Probable anterior infarct, age indeterminate Baseline wander in lead(s) V6 When compared to previous tracing, RBBB is new.  Assessment/Plan Principal Problem:   Chest pain Observation/telemetry. Supplemental oxygen as needed. Sublingual nitroglycerin as needed. Trend troponin level. Check echocardiogram in a.m.  Active Problems:   Coronary artery disease with angina pectoris ([place]) As above. Continue aspirin, clopidogrel, statin and  beta-blocker.    Hypomagnesemia Replacing. Follow-up magnesium level as needed. Advised to supplement magnesium regularly.    AKI (acute kidney injury) ([place]) Continue IV hydration. Follow-up renal function in a.m. Check renal ultrasound in the morning.    Hypertension Monitor blood pressure.    Thyroid disease Hold levothyroxine for a few days. Continue beta blocker. Resume levothyroxine at a lower dose.    Hyperlipidemia Continue Crestor 40 mg p.o. daily. Monitor LFTs as needed. Fasting lipid follow-up as an outpatient per PCP or cardiology.    GERD (gastroesophageal reflux disease) Protonix 40 mg p.o. daily.    Sleep apnea Not on CPAP     DVT prophylaxis: Lovenox SQ. Code Status: Full code. Family Communication:  Disposition Plan: Observation for chest pain work-up and IV hydration. Consults called: Admission status: Observation/telemetry.   Reubin Milan MD [facility] Pager [phone].  If 7PM-7AM, please contact night-coverage [email or web address] Password [email or web address]  06/10/2017, 11:44 PM

## 2017-06-10 NOTE — Telephone Encounter (Signed)
New Message  Pt's pcp thinks she has an heart attack and states they sent a fax regarding it, Pt would like to know if she should take an ambulance to the er. Please call

## 2017-06-10 NOTE — Telephone Encounter (Signed)
Pt called stating that she and her PCP were concerned she was having a heart attack - she is also walking around feeling she will black out.  I instructed her to go to [facility] to be evalauted  - she does have appt with Dr. Tamala Julian for end of June which we will keep but to ER now.  She agreed.

## 2017-06-10 NOTE — ED Notes (Signed)
Given one sl ntg, bp dropped to 85/70, pt denies any decrease in cp at this time

## 2017-06-11 ENCOUNTER — Other Ambulatory Visit: Payer: Self-pay

## 2017-06-11 ENCOUNTER — Other Ambulatory Visit (HOSPITAL_COMMUNITY): Payer: Medicare Other

## 2017-06-11 ENCOUNTER — Encounter (HOSPITAL_COMMUNITY)
Admission: EM | Disposition: A | Discharge status: Home / Self Care | Payer: Self-pay | Source: Home / Self Care | Attending: Cardiology

## 2017-06-11 DIAGNOSIS — Z7952 Long term (current) use of systemic steroids: Secondary | ICD-10-CM | POA: Diagnosis not present

## 2017-06-11 DIAGNOSIS — J45909 Unspecified asthma, uncomplicated: Secondary | ICD-10-CM | POA: Diagnosis present

## 2017-06-11 DIAGNOSIS — I25110 Atherosclerotic heart disease of native coronary artery with unstable angina pectoris: Secondary | ICD-10-CM | POA: Diagnosis present

## 2017-06-11 DIAGNOSIS — I340 Nonrheumatic mitral (valve) insufficiency: Secondary | ICD-10-CM | POA: Diagnosis not present

## 2017-06-11 DIAGNOSIS — Y832 Surgical operation with anastomosis, bypass or graft as the cause of abnormal reaction of the patient, or of later complication, without mention of misadventure at the time of the procedure: Secondary | ICD-10-CM | POA: Diagnosis present

## 2017-06-11 DIAGNOSIS — N189 Chronic kidney disease, unspecified: Secondary | ICD-10-CM | POA: Diagnosis not present

## 2017-06-11 DIAGNOSIS — I4510 Unspecified right bundle-branch block: Secondary | ICD-10-CM | POA: Diagnosis present

## 2017-06-11 DIAGNOSIS — N289 Disorder of kidney and ureter, unspecified: Secondary | ICD-10-CM

## 2017-06-11 DIAGNOSIS — Z79899 Other long term (current) drug therapy: Secondary | ICD-10-CM | POA: Diagnosis not present

## 2017-06-11 DIAGNOSIS — Z7982 Long term (current) use of aspirin: Secondary | ICD-10-CM | POA: Diagnosis not present

## 2017-06-11 DIAGNOSIS — N179 Acute kidney failure, unspecified: Secondary | ICD-10-CM | POA: Diagnosis not present

## 2017-06-11 DIAGNOSIS — Z79890 Hormone replacement therapy (postmenopausal): Secondary | ICD-10-CM | POA: Diagnosis not present

## 2017-06-11 DIAGNOSIS — E785 Hyperlipidemia, unspecified: Secondary | ICD-10-CM | POA: Diagnosis present

## 2017-06-11 DIAGNOSIS — R079 Chest pain, unspecified: Secondary | ICD-10-CM

## 2017-06-11 DIAGNOSIS — F410 Panic disorder [episodic paroxysmal anxiety] without agoraphobia: Secondary | ICD-10-CM | POA: Diagnosis present

## 2017-06-11 DIAGNOSIS — Z8249 Family history of ischemic heart disease and other diseases of the circulatory system: Secondary | ICD-10-CM | POA: Diagnosis not present

## 2017-06-11 DIAGNOSIS — G4733 Obstructive sleep apnea (adult) (pediatric): Secondary | ICD-10-CM | POA: Diagnosis present

## 2017-06-11 DIAGNOSIS — K219 Gastro-esophageal reflux disease without esophagitis: Secondary | ICD-10-CM | POA: Diagnosis present

## 2017-06-11 DIAGNOSIS — E119 Type 2 diabetes mellitus without complications: Secondary | ICD-10-CM | POA: Diagnosis not present

## 2017-06-11 DIAGNOSIS — E7800 Pure hypercholesterolemia, unspecified: Secondary | ICD-10-CM | POA: Diagnosis not present

## 2017-06-11 DIAGNOSIS — Z7902 Long term (current) use of antithrombotics/antiplatelets: Secondary | ICD-10-CM | POA: Diagnosis not present

## 2017-06-11 DIAGNOSIS — G8929 Other chronic pain: Secondary | ICD-10-CM | POA: Diagnosis present

## 2017-06-11 DIAGNOSIS — T82858A Stenosis of vascular prosthetic devices, implants and grafts, initial encounter: Secondary | ICD-10-CM | POA: Diagnosis not present

## 2017-06-11 DIAGNOSIS — Z955 Presence of coronary angioplasty implant and graft: Secondary | ICD-10-CM | POA: Diagnosis not present

## 2017-06-11 DIAGNOSIS — Z90710 Acquired absence of both cervix and uterus: Secondary | ICD-10-CM | POA: Diagnosis not present

## 2017-06-11 DIAGNOSIS — Z794 Long term (current) use of insulin: Secondary | ICD-10-CM | POA: Diagnosis not present

## 2017-06-11 DIAGNOSIS — E860 Dehydration: Secondary | ICD-10-CM | POA: Diagnosis not present

## 2017-06-11 DIAGNOSIS — I10 Essential (primary) hypertension: Secondary | ICD-10-CM | POA: Diagnosis present

## 2017-06-11 HISTORY — PX: LEFT HEART CATH AND CORONARY ANGIOGRAPHY: CATH118249

## 2017-06-11 HISTORY — PX: CORONARY BALLOON ANGIOPLASTY: CATH118233

## 2017-06-11 LAB — GLUCOSE, CAPILLARY
Glucose-Capillary: 131 mg/dL — ABNORMAL HIGH (ref 65–99)
Glucose-Capillary: 132 mg/dL — ABNORMAL HIGH (ref 65–99)
Glucose-Capillary: 153 mg/dL — ABNORMAL HIGH (ref 65–99)
Glucose-Capillary: 231 mg/dL — ABNORMAL HIGH (ref 65–99)
Glucose-Capillary: 86 mg/dL (ref 65–99)
Glucose-Capillary: 97 mg/dL (ref 65–99)

## 2017-06-11 LAB — POCT ACTIVATED CLOTTING TIME
Activated Clotting Time: 235 seconds
Activated Clotting Time: 279 seconds
Activated Clotting Time: 169 seconds

## 2017-06-11 LAB — COMPREHENSIVE METABOLIC PANEL
ALT: 27 U/L (ref 14–54)
AST: 27 U/L (ref 15–41)
Albumin: 3.6 g/dL (ref 3.5–5.0)
Alkaline Phosphatase: 91 U/L (ref 38–126)
Anion gap: 9 (ref 5–15)
BUN: 25 mg/dL — ABNORMAL HIGH (ref 6–20)
CO2: 28 mmol/L (ref 22–32)
Calcium: 8.6 mg/dL — ABNORMAL LOW (ref 8.9–10.3)
Chloride: 101 mmol/L (ref 101–111)
Creatinine, Ser: 1.55 mg/dL — ABNORMAL HIGH (ref 0.44–1.00)
GFR calc Af Amer: 40 mL/min — ABNORMAL LOW (ref >60)
GFR calc non Af Amer: 35 mL/min — ABNORMAL LOW (ref >60)
Glucose, Bld: 139 mg/dL — ABNORMAL HIGH (ref 65–99)
Potassium: 3.5 mmol/L (ref 3.5–5.1)
Sodium: 138 mmol/L (ref 135–145)
Total Bilirubin: 0.9 mg/dL (ref 0.3–1.2)
Total Protein: 6.1 g/dL — ABNORMAL LOW (ref 6.5–8.1)

## 2017-06-11 LAB — TROPONIN I: Troponin I: <0.03 ng/mL (ref <0.03)

## 2017-06-11 SURGERY — LEFT HEART CATH AND CORONARY ANGIOGRAPHY
Anesthesia: LOCAL

## 2017-06-11 MED ORDER — SODIUM CHLORIDE 0.9 % IV SOLN: INTRAVENOUS | Status: AC

## 2017-06-11 MED ORDER — HEPARIN SODIUM (PORCINE) 1000 UNIT/ML IJ SOLN
INTRAMUSCULAR | Status: DC | PRN
  Administered 2017-06-11: 7000 [IU] via INTRAVENOUS
  Administered 2017-06-11: 3000 [IU] via INTRAVENOUS

## 2017-06-11 MED ORDER — HEPARIN (PORCINE) IN NACL 2-0.9 UNITS/ML
INTRAMUSCULAR | Status: AC | PRN
  Administered 2017-06-11 (×2): 500 mL

## 2017-06-11 MED ORDER — GABAPENTIN 300 MG PO CAPS
300.0000 mg | ORAL_CAPSULE | Freq: Every day | ORAL | Status: DC
  Administered 2017-06-11 (×2): 300 mg via ORAL
  Filled 2017-06-11 (×2): qty 1

## 2017-06-11 MED ORDER — NITROGLYCERIN 0.4 MG SL SUBL
SUBLINGUAL_TABLET | SUBLINGUAL | Status: AC
  Filled 2017-06-11: qty 1

## 2017-06-11 MED ORDER — HEPARIN BOLUS VIA INFUSION
4000.0000 [IU] | Freq: Once | INTRAVENOUS | Status: AC
  Administered 2017-06-11: 4000 [IU] via INTRAVENOUS
  Filled 2017-06-11: qty 4000

## 2017-06-11 MED ORDER — ROSUVASTATIN CALCIUM 20 MG PO TABS
40.0000 mg | ORAL_TABLET | Freq: Every day | ORAL | Status: DC
  Administered 2017-06-11 – 2017-06-12 (×2): 40 mg via ORAL
  Filled 2017-06-11 (×2): qty 2

## 2017-06-11 MED ORDER — FENTANYL CITRATE (PF) 100 MCG/2ML IJ SOLN
INTRAMUSCULAR | Status: AC
  Filled 2017-06-11: qty 2

## 2017-06-11 MED ORDER — CLOPIDOGREL BISULFATE 75 MG PO TABS
75.0000 mg | ORAL_TABLET | Freq: Every day | ORAL | Status: DC
  Administered 2017-06-11 – 2017-06-12 (×2): 75 mg via ORAL
  Filled 2017-06-11 (×2): qty 1

## 2017-06-11 MED ORDER — SODIUM CHLORIDE 0.9% FLUSH: 3.0000 mL | INTRAVENOUS | Status: DC | PRN

## 2017-06-11 MED ORDER — ONDANSETRON HCL 4 MG/2ML IJ SOLN
INTRAMUSCULAR | Status: DC | PRN
  Administered 2017-06-11: 4 mg via INTRAVENOUS

## 2017-06-11 MED ORDER — SODIUM CHLORIDE 0.9% FLUSH
3.0000 mL | Freq: Two times a day (BID) | INTRAVENOUS | Status: DC
  Administered 2017-06-11: 22:00:00 3 mL via INTRAVENOUS

## 2017-06-11 MED ORDER — METOPROLOL SUCCINATE ER 25 MG PO TB24
25.0000 mg | ORAL_TABLET | Freq: Every day | ORAL | Status: DC
  Administered 2017-06-11 – 2017-06-12 (×2): 25 mg via ORAL
  Filled 2017-06-11 (×2): qty 1

## 2017-06-11 MED ORDER — NITROGLYCERIN 0.4 MG SL SUBL: 0.4000 mg | SUBLINGUAL_TABLET | SUBLINGUAL | Status: DC | PRN

## 2017-06-11 MED ORDER — SODIUM CHLORIDE 0.9 % IV SOLN: 250.0000 mL | INTRAVENOUS | Status: DC | PRN

## 2017-06-11 MED ORDER — LIDOCAINE HCL (PF) 1 % IJ SOLN
INTRAMUSCULAR | Status: DC | PRN
  Administered 2017-06-11: 22 mL

## 2017-06-11 MED ORDER — INSULIN ASPART PROT & ASPART (70-30 MIX) 100 UNIT/ML ~~LOC~~ SUSP
75.0000 [IU] | Freq: Two times a day (BID) | SUBCUTANEOUS | Status: DC
  Filled 2017-06-11: qty 10

## 2017-06-11 MED ORDER — ONDANSETRON HCL 4 MG/2ML IJ SOLN
INTRAMUSCULAR | Status: AC
  Filled 2017-06-11: qty 2

## 2017-06-11 MED ORDER — MIDAZOLAM HCL 2 MG/2ML IJ SOLN
INTRAMUSCULAR | Status: AC
  Filled 2017-06-11: qty 2

## 2017-06-11 MED ORDER — HEPARIN (PORCINE) IN NACL 1000-0.9 UT/500ML-% IV SOLN
INTRAVENOUS | Status: AC
  Filled 2017-06-11: qty 1000

## 2017-06-11 MED ORDER — CYCLOBENZAPRINE HCL 10 MG PO TABS: 10.0000 mg | ORAL_TABLET | Freq: Three times a day (TID) | ORAL | Status: DC | PRN

## 2017-06-11 MED ORDER — MIRTAZAPINE 30 MG PO TABS
30.0000 mg | ORAL_TABLET | Freq: Every day | ORAL | Status: DC
  Administered 2017-06-11 (×2): 30 mg via ORAL
  Filled 2017-06-11: qty 2
  Filled 2017-06-11: qty 1

## 2017-06-11 MED ORDER — LABETALOL HCL 5 MG/ML IV SOLN: 10.0000 mg | INTRAVENOUS | Status: AC | PRN

## 2017-06-11 MED ORDER — ASPIRIN EC 81 MG PO TBEC
81.0000 mg | DELAYED_RELEASE_TABLET | Freq: Every day | ORAL | Status: DC
  Administered 2017-06-11 – 2017-06-12 (×2): 81 mg via ORAL
  Filled 2017-06-11 (×2): qty 1

## 2017-06-11 MED ORDER — CITALOPRAM HYDROBROMIDE 20 MG PO TABS
40.0000 mg | ORAL_TABLET | Freq: Every day | ORAL | Status: DC
  Administered 2017-06-11 – 2017-06-12 (×2): 40 mg via ORAL
  Filled 2017-06-11 (×2): qty 2

## 2017-06-11 MED ORDER — SODIUM CHLORIDE 0.9 % IV SOLN: INTRAVENOUS | Status: DC

## 2017-06-11 MED ORDER — HEPARIN (PORCINE) IN NACL 100-0.45 UNIT/ML-% IJ SOLN
850.0000 [IU]/h | INTRAMUSCULAR | Status: DC
  Administered 2017-06-11: 850 [IU]/h via INTRAVENOUS
  Filled 2017-06-11: qty 250

## 2017-06-11 MED ORDER — VITAMIN D 1000 UNITS PO TABS
2000.0000 [IU] | ORAL_TABLET | Freq: Every day | ORAL | Status: DC
  Administered 2017-06-11 – 2017-06-12 (×2): 2000 [IU] via ORAL
  Filled 2017-06-11 (×2): qty 2

## 2017-06-11 MED ORDER — IOHEXOL 350 MG/ML SOLN
INTRAVENOUS | Status: DC | PRN
  Administered 2017-06-11: 120 mL via INTRA_ARTERIAL

## 2017-06-11 MED ORDER — MIDAZOLAM HCL 2 MG/2ML IJ SOLN
INTRAMUSCULAR | Status: DC | PRN
  Administered 2017-06-11: 1 mg via INTRAVENOUS
  Administered 2017-06-11: 2 mg via INTRAVENOUS
  Administered 2017-06-11: 0.5 mg via INTRAVENOUS

## 2017-06-11 MED ORDER — NITROGLYCERIN 2 % TD OINT
0.5000 [in_us] | TOPICAL_OINTMENT | Freq: Four times a day (QID) | TRANSDERMAL | Status: DC
  Administered 2017-06-11: 0.5 [in_us] via TOPICAL
  Filled 2017-06-11: qty 1

## 2017-06-11 MED ORDER — HYDRALAZINE HCL 20 MG/ML IJ SOLN: 5.0000 mg | INTRAMUSCULAR | Status: AC | PRN

## 2017-06-11 MED ORDER — LEVOTHYROXINE SODIUM 112 MCG PO TABS
224.0000 ug | ORAL_TABLET | Freq: Every day | ORAL | Status: DC
  Administered 2017-06-12: 224 ug via ORAL
  Filled 2017-06-11: qty 2

## 2017-06-11 MED ORDER — FENTANYL CITRATE (PF) 100 MCG/2ML IJ SOLN
INTRAMUSCULAR | Status: DC | PRN
  Administered 2017-06-11: 50 ug via INTRAVENOUS
  Administered 2017-06-11 (×3): 25 ug via INTRAVENOUS

## 2017-06-11 MED ORDER — NITROGLYCERIN 0.4 MG SL SUBL
SUBLINGUAL_TABLET | SUBLINGUAL | Status: DC | PRN
  Administered 2017-06-11 (×2): .4 mg via SUBLINGUAL

## 2017-06-11 MED ORDER — SODIUM CHLORIDE 0.9 % IV SOLN
INTRAVENOUS | Status: DC
  Administered 2017-06-11: 12:00:00 via INTRAVENOUS

## 2017-06-11 MED ORDER — TIOTROPIUM BROMIDE MONOHYDRATE 18 MCG IN CAPS
18.0000 ug | ORAL_CAPSULE | Freq: Every day | RESPIRATORY_TRACT | Status: DC
  Administered 2017-06-11: 18 ug via RESPIRATORY_TRACT
  Filled 2017-06-11: qty 5

## 2017-06-11 MED ORDER — MORPHINE SULFATE (PF) 2 MG/ML IV SOLN
2.0000 mg | Freq: Once | INTRAVENOUS | Status: AC
  Administered 2017-06-11: 2 mg via INTRAVENOUS
  Filled 2017-06-11: qty 1

## 2017-06-11 MED ORDER — MORPHINE SULFATE (PF) 2 MG/ML IV SOLN: 2.0000 mg | INTRAVENOUS | Status: DC | PRN

## 2017-06-11 MED ORDER — ANGIOPLASTY BOOK
Freq: Once | Status: AC
  Administered 2017-06-12
  Filled 2017-06-11: qty 1

## 2017-06-11 MED ORDER — OXYCODONE HCL ER 10 MG PO T12A
10.0000 mg | EXTENDED_RELEASE_TABLET | Freq: Two times a day (BID) | ORAL | Status: DC
  Administered 2017-06-11 – 2017-06-12 (×4): 10 mg via ORAL
  Filled 2017-06-11 (×4): qty 1

## 2017-06-11 MED ORDER — LIDOCAINE HCL (PF) 1 % IJ SOLN
INTRAMUSCULAR | Status: AC
  Filled 2017-06-11: qty 30

## 2017-06-11 MED ORDER — METFORMIN HCL ER 500 MG PO TB24
1000.0000 mg | ORAL_TABLET | Freq: Two times a day (BID) | ORAL | Status: DC
  Administered 2017-06-11: 1000 mg via ORAL
  Filled 2017-06-11: qty 2

## 2017-06-11 MED ORDER — MOMETASONE FURO-FORMOTEROL FUM 200-5 MCG/ACT IN AERO
2.0000 | INHALATION_SPRAY | Freq: Two times a day (BID) | RESPIRATORY_TRACT | Status: DC
  Administered 2017-06-11: 2 via RESPIRATORY_TRACT
  Filled 2017-06-11: qty 8.8

## 2017-06-11 MED ORDER — CLOPIDOGREL BISULFATE 300 MG PO TABS
ORAL_TABLET | ORAL | Status: DC | PRN
  Administered 2017-06-11: 300 mg via ORAL

## 2017-06-11 MED ORDER — DIAZEPAM 5 MG PO TABS: 5.0000 mg | ORAL_TABLET | Freq: Three times a day (TID) | ORAL | Status: DC | PRN

## 2017-06-11 MED ORDER — HYDROXYZINE HCL 25 MG PO TABS
25.0000 mg | ORAL_TABLET | Freq: Two times a day (BID) | ORAL | Status: DC
  Administered 2017-06-11 – 2017-06-12 (×4): 25 mg via ORAL
  Filled 2017-06-11 (×4): qty 1

## 2017-06-11 MED ORDER — CLOPIDOGREL BISULFATE 300 MG PO TABS
ORAL_TABLET | ORAL | Status: AC
  Filled 2017-06-11: qty 1

## 2017-06-11 SURGICAL SUPPLY — 13 items
BALLN SAPPHIRE ~~LOC~~ 3.5X15 (BALLOONS) ×1 IMPLANT
BALLN WOLVERINE 3.00X15 (BALLOONS) ×2
BALLOON WOLVERINE 3.00X15 (BALLOONS) IMPLANT
CATH INFINITI 5FR MULTPACK ANG (CATHETERS) ×1 IMPLANT
CATH VISTA GUIDE 6FR XBLAD3.5 (CATHETERS) ×1 IMPLANT
KIT HEART LEFT (KITS) ×2 IMPLANT
PACK CARDIAC CATHETERIZATION (CUSTOM PROCEDURE TRAY) ×2 IMPLANT
SHEATH AVANTI 11CM 5FR (SHEATH) ×1 IMPLANT
SHEATH PINNACLE 6F 10CM (SHEATH) ×1 IMPLANT
TRANSDUCER W/STOPCOCK (MISCELLANEOUS) ×2 IMPLANT
TUBING CIL FLEX 10 FLL-RA (TUBING) ×3 IMPLANT
WIRE ASAHI PROWATER 180CM (WIRE) ×1 IMPLANT
WIRE EMERALD 3MM-J .035X150CM (WIRE) ×1 IMPLANT

## 2017-06-11 NOTE — H&P (View-Only) (Signed)
Cardiology Consultation:   Patient ID: [name]; [id number]; 04/07/1955   Admit date: 06/10/2017 Date of Consult: 06/11/2017  Primary Care Provider: Sinda Du, MD Primary Cardiologist: Dr Tamala Julian   Patient Profile:   [name] is a 62 y.o. female with a hx of CAD who is being seen today for the evaluation of chest pain at the request of Dr Luan Pulling.  History of Present Illness:   [name] is a pleasant 62 y/o female followed by Dr Tamala Julian with a history of CAD. She had an LAD PCI in 2005. Cath done in 2014 after an abnormal Myoview revealed patent pLAD stent but distal LAD disease. The plan is for medical Rx. She had done well on medications till this past week when she noted SSCP that radiated to her back. She says its worse with exertion and associated with diaphoresis. On one occasion she says she "passed out" carrying laundry to her car. She has taken NTG at home-"didn't help". She called the office yesterday and was instructed to go to the closest ED. She was admitted to [facility] Her Troponin have been negative, EKG shows no acute changes from prior.   Past Medical History:  Diagnosis Date  . Asthma   . Chest pain   . Coronary artery disease   . Diabetes mellitus   . GERD (gastroesophageal reflux disease)   . Hypercholesteremia   . Hypertension   . Panic attack   . Sleep apnea   . Thyroid disease     Past Surgical History:  Procedure Laterality Date  . ABDOMINAL HYSTERECTOMY    . BACK SURGERY    . CHOLECYSTECTOMY    . CORONARY ANGIOPLASTY WITH STENT PLACEMENT    . ELBOW SURGERY    . KNEE SURGERY    . LEFT HEART CATHETERIZATION WITH CORONARY ANGIOGRAM N/A 06/23/2012   Procedure: LEFT HEART CATHETERIZATION WITH CORONARY ANGIOGRAM;  Surgeon: Sinclair Grooms, MD;  Location: [facility] CATH LAB;  Service: Cardiovascular;  Laterality: N/A;     Home Medications:  Prior to Admission medications   Medication Sig Start Date End Date Taking? Authorizing Provider    diazepam (VALIUM) 5 MG tablet Take 5 mg by mouth every 8 (eight) hours as needed for anxiety.   Yes [provider]  isosorbide mononitrate (IMDUR) 60 MG 24 hr tablet Take 1 tablet (60 mg total) by mouth daily. 04/11/13  Yes Belva Crome, MD  levothyroxine (SYNTHROID, LEVOTHROID) 125 MCG tablet Take 250 mcg by mouth daily. 05/27/17  Yes [provider]  levothyroxine (SYNTHROID, LEVOTHROID) 200 MCG tablet Take 200 mcg by mouth daily before breakfast.   Yes [provider]  losartan (COZAAR) 100 MG tablet Take 100 mg by mouth daily.   Yes [provider]  meclizine (ANTIVERT) 25 MG tablet Take 1 tablet by mouth 4 (four) times daily as needed. 04/12/17  Yes [provider]  metFORMIN (GLUCOPHAGE-XR) 500 MG 24 hr tablet Take 1,000 mg by mouth 2 (two) times daily. 05/16/17  Yes [provider]  metoprolol (TOPROL-XL) 200 MG 24 hr tablet Take 200 mg by mouth daily.     Yes [provider]  mirtazapine (REMERON) 30 MG tablet Take 30 mg by mouth at bedtime.   Yes [provider]  NITROSTAT 0.4 MG SL tablet PLACE 1 TABLET (0.4 MG TOTAL) UNDER THE TONGUE EVERY 5 (FIVE) MINUTES AS NEEDED FOR CHEST PAIN. 03/19/15  Yes Belva Crome, MD  acyclovir (ZOVIRAX) 800 MG tablet Take  1 tablet by mouth 5 (five) times daily. 03/25/17   [provider]  albuterol (PROVENTIL HFA;VENTOLIN HFA) 108 (90 BASE) MCG/ACT inhaler Inhale 2 puffs into the lungs every 4 (four) hours as needed for wheezing.    [provider]  aspirin EC 81 MG tablet Take 81 mg by mouth daily.    [provider]  budesonide-formoterol (SYMBICORT) 160-4.5 MCG/ACT inhaler Inhale 2 puffs into the lungs 2 (two) times daily.    [provider]  Cholecalciferol (VITAMIN D3 SUPER STRENGTH) 2000 units TABS Take 1 tablet by mouth daily.    [provider]  citalopram (CELEXA) 40 MG tablet Take 40 mg by mouth daily. 05/05/17   [provider]   clopidogrel (PLAVIX) 75 MG tablet Take 1 tablet (75 mg total) by mouth daily. Please keep upcoming appt for future refills. Thank you 06/03/17   Belva Crome, MD  cyclobenzaprine (FLEXERIL) 10 MG tablet Take 10 mg by mouth 3 (three) times daily as needed for muscle spasms.    [provider]  diclofenac sodium (VOLTAREN) 1 % GEL APPLY TO AFFECTED AREA(S) THREE TIMES DAILY 04/08/17   [provider]  fluconazole (DIFLUCAN) 150 MG tablet See admin instructions. 05/20/17   [provider]  furosemide (LASIX) 40 MG tablet Take 40 mg by mouth 2 (two) times daily.      [provider]  furosemide (LASIX) 40 MG tablet Take by mouth.    [provider]  gabapentin (NEURONTIN) 300 MG capsule Take 300 mg by mouth at bedtime.    [provider]  HYDROcodone-acetaminophen (NORCO) 10-325 MG per tablet Take 1 tablet by mouth every 6 (six) hours as needed for pain.    [provider]  HYDROcodone-acetaminophen (NORCO) 10-325 MG tablet Take by mouth.    [provider]  hydrOXYzine (ATARAX/VISTARIL) 25 MG tablet Take 1 tablet by mouth 2 (two) times daily. 05/05/17   [provider]  insulin lispro protamine-lispro (HUMALOG 75/25) (75-25) 100 UNIT/ML SUSP Inject 75 Units into the skin 2 (two) times daily with a meal. Takes 75 units in am and 70 units in pm     [provider]  omeprazole (PRILOSEC) 20 MG capsule Take 20 mg by mouth daily.    [provider]  ondansetron (ZOFRAN) 4 MG tablet Take 4 mg by mouth 4 (four) times daily. 05/22/17   [provider]  oxyCODONE (OXYCONTIN) 10 mg 12 hr tablet Take 1 tablet by mouth every 12 (twelve) hours.    [provider]  potassium chloride SA (K-DUR,KLOR-CON) 20 MEQ tablet Take 40 mEq by mouth daily.    [provider]  rosuvastatin (CRESTOR) 40 MG tablet Take 40 mg by mouth daily.      [provider]  tiotropium (SPIRIVA) 18 MCG inhalation  capsule Place 18 mcg into inhaler and inhale daily.    [provider]  tobramycin-dexamethasone [facility]) ophthalmic solution     [provider]    Inpatient Medications: Scheduled Meds: . aspirin EC  81 mg Oral Daily  . cholecalciferol  2,000 Units Oral Daily  . citalopram  40 mg Oral Daily  . clopidogrel  75 mg Oral Daily  . enoxaparin (LOVENOX) injection  40 mg Subcutaneous Q24H  . feeding supplement (ENSURE ENLIVE)  237 mL Oral BID BM  . gabapentin  300 mg Oral QHS  . hydrOXYzine  25 mg Oral BID  . insulin aspart  0-15 Units Subcutaneous TID WC  .  insulin aspart protamine- aspart  75 Units Subcutaneous BID WC  . metFORMIN  1,000 mg Oral BID WC  . mirtazapine  30 mg Oral QHS  . mometasone-formoterol  2 puff Inhalation BID  . oxyCODONE  10 mg Oral Q12H  . rosuvastatin  40 mg Oral Daily  . tiotropium  18 mcg Inhalation Daily   Continuous Infusions: . sodium chloride 125 mL/hr at 06/11/17 0114   PRN Meds: acetaminophen, ALPRAZolam, cyclobenzaprine, nitroGLYCERIN, ondansetron (ZOFRAN) IV  Allergies:    Allergies  Allergen Reactions  . Tape Other (See Comments)    Adhesive breaks me out (rash)    Social History:   Social History   Socioeconomic History  . Marital status: Divorced    Spouse name: Not on file  . Number of children: Not on file  . Years of education: Not on file  . Highest education level: Not on file  Occupational History  . Not on file  Social Needs  . Financial resource strain: Not on file  . Food insecurity:    Worry: Not on file    Inability: Not on file  . Transportation needs:    Medical: Not on file    Non-medical: Not on file  Tobacco Use  . Smoking status: Never Smoker  . Smokeless tobacco: Never Used  Substance and Sexual Activity  . Alcohol use: No    Alcohol/week: 0.0 oz  . Drug use: No  . Sexual activity: Not on file  Lifestyle  . Physical activity:    Days per week: Not on file    Minutes per session:  Not on file  . Stress: Not on file  Relationships  . Social connections:    Talks on phone: Not on file    Gets together: Not on file    Attends religious service: Not on file    Active member of club or organization: Not on file    Attends meetings of clubs or organizations: Not on file    Relationship status: Not on file  . Intimate partner violence:    Fear of current or ex partner: Not on file    Emotionally abused: Not on file    Physically abused: Not on file    Forced sexual activity: Not on file  Other Topics Concern  . Not on file  Social History Narrative  . Not on file    Family History:    Family History  Problem Relation Age of Onset  . COPD Mother   . Heart disease Father   . Heart attack Father      ROS:  Please see the history of present illness.  All other ROS reviewed and negative.     Physical Exam/Data:   Vitals:   06/10/17 2215 06/10/17 2230 06/11/17 0008 06/11/17 0535  BP:  140/69 124/72 (!) 135/58  Pulse: 87 88 89 83  Resp: 15 14 20 14   Temp:   98.6 F (37 C) (!) 97.4 F (36.3 C)  TempSrc:   Oral Oral  SpO2: 96% 95% 96% 95%  Weight:   194 lb 10.7 oz (88.3 kg)   Height:        Intake/Output Summary (Last 24 hours) at 06/11/2017 0904 Last data filed at 06/11/2017 0558 Gross per 24 hour  Intake 1331.67 ml  Output -  Net 1331.67 ml   Filed Weights   06/10/17 1956 06/11/17 0008  Weight: 180 lb (81.6 kg) 194 lb 10.7 oz (88.3 kg)   Body mass index  is 33.41 kg/m.  General:  Overweight female well developed, in no acute distress HEENT: normal Lymph: no adenopathy Neck: no JVD Endocrine:  No thryomegaly Vascular: No carotid bruits; FA pulses 2+ bilaterally without bruits  Cardiac:  normal S1, S2; RRR; no murmur  Lungs:  clear to auscultation bilaterally, no wheezing, rhonchi or rales  Abd: soft, nontender, no hepatomegaly  Ext: no edema Musculoskeletal:  No deformities, BUE and BLE strength normal and equal Skin: warm and dry    Neuro:  CNs 2-12 intact, no focal abnormalities noted Psych:  Normal affect   EKG:  The EKG was personally reviewed and demonstrates:  NSR, RBBB Telemetry:  Telemetry was personally reviewed and demonstrates:  NSR  Relevant CV Studies:   Laboratory Data:  Chemistry Recent Labs  Lab 06/10/17 2013 06/11/17 0525  NA 135 138  K 3.8 3.5  CL 99* 101  CO2 24 28  GLUCOSE 296* 139*  BUN 21* 25*  CREATININE 1.54* 1.55*  CALCIUM 8.8* 8.6*  GFRNONAA 35* 35*  GFRAA 41* 40*  ANIONGAP 12 9    Recent Labs  Lab 06/11/17 0525  PROT 6.1*  ALBUMIN 3.6  AST 27  ALT 27  ALKPHOS 91  BILITOT 0.9   Hematology Recent Labs  Lab 06/10/17 2013  WBC 6.0  RBC 4.44  HGB 13.5  HCT 39.8  MCV 89.6  MCH 30.4  MCHC 33.9  RDW 12.6  PLT 161   Cardiac Enzymes Recent Labs  Lab 06/10/17 2013 06/10/17 2252 06/11/17 0525  TROPONINI <0.03 <0.03 <0.03   No results for input(s): TROPIPOC in the last 168 hours.  BNPNo results for input(s): BNP, PROBNP in the last 168 hours.  DDimer No results for input(s): DDIMER in the last 168 hours.  Radiology/Studies:  Dg Chest 2 View  Result Date: 06/10/2017 CLINICAL DATA:  Chest pain EXAM: CHEST - 2 VIEW COMPARISON:  08/02/2014 FINDINGS: The heart size and mediastinal contours are within normal limits. Both lungs are clear. The visualized skeletal structures are unremarkable. IMPRESSION: No active cardiopulmonary disease. Electronically Signed   By: Ulyses Jarred M.D.   On: 06/10/2017 21:24    Assessment and Plan:   Chest pain with a moderate risk for ACS- MI r/o  CAD- S/p LAD PCI in '05, cath 2014 showed patent LAD stent with distal LAD disease- medical Rx  Renal insufficiency Dr Bubba Camp her endocrinologist has mentioned this to her as well.  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 She has been hydrated- NS 125 cc/hr. Will hold Glucophage, Lasix and Cozaar on hold.   For questions or updates, please contact [place] Please consult [email or web address] for contact info under Cardiology/STEMI.   Angelena Form, PA-C  06/11/2017 9:04 AM   Patient seen and discussed with PA Rosalyn Gess, I agree with his documentation. History of CAD with prior stents to LAD, OSA, DM2, HTN, presents with chest pain concerning for unstable angina.     K 3.8 Cr 1.54 BUN 21 WBC 6 Hgb 13.5 Plt 161 TSH 0.057  Mg 1.5 Tropneg x 3 CXR no acute process EKG SR, RBBB/LAFB, no acute ischemic changes Echo pending  62 yo female with known CAD presents with chest pain concerning for unstable angina. EKG and enzymes benign thus far, however given her history and  description of symptoms concern is high enough to warrant a cath. Will plan for transfer today for cath. AKI, continue IVF. Hold diuretics, ARB. Currently on ASA, plavix, crestor 40. BP meds held due to some hypotension, improving with IVFs, restart low dose Toprol XL 25mg  and titrate back up to home dose over time. Change IVFs from 1/2 NS to NS at 112mL/hr x 1.5 liters.   Carlyle Dolly MD

## 2017-06-11 NOTE — Consult Note (Addendum)
Cardiology Consultation:   Patient ID: [name]; [id number]; [birth year]   Admit date: 06/10/2017 Date of Consult: 06/11/2017  Primary Care Provider: Sinda Du, MD Primary Cardiologist: [name]   Patient Profile:   [name] is a 62 y.o. female with a hx of CAD who is being seen today for the evaluation of chest pain at the request of [name].  History of Present Illness:   [name] is a pleasant 62 y/o female followed by [name] with a history of CAD. She had an LAD PCI in 2005. Cath done in 2014 after an abnormal Myoview revealed patent pLAD stent but distal LAD disease. The plan is for medical Rx. She had done well on medications till this past week when she noted SSCP that radiated to her back. She says its worse with exertion and associated with diaphoresis. On one occasion she says she "passed out" carrying laundry to her car. She has taken NTG at home-"didn't help". She called the office yesterday and was instructed to go to the closest ED. She was admitted to [facility]. Her Troponin have been negative, EKG shows no acute changes from prior.   Past Medical History:  Diagnosis Date  . Asthma   . Chest pain   . Coronary artery disease   . Diabetes mellitus   . GERD (gastroesophageal reflux disease)   . Hypercholesteremia   . Hypertension   . Panic attack   . Sleep apnea   . Thyroid disease     Past Surgical History:  Procedure Laterality Date  . ABDOMINAL HYSTERECTOMY    . BACK SURGERY    . CHOLECYSTECTOMY    . CORONARY ANGIOPLASTY WITH STENT PLACEMENT    . ELBOW SURGERY    . KNEE SURGERY    . LEFT HEART CATHETERIZATION WITH CORONARY ANGIOGRAM N/A 06/23/2012   Procedure: LEFT HEART CATHETERIZATION WITH CORONARY ANGIOGRAM;  Surgeon: Sinclair Grooms, MD;  Location: [facility] CATH LAB;  Service: Cardiovascular;  Laterality: N/A;     Home Medications:  Prior to Admission medications   Medication Sig Start Date End Date Taking? Authorizing Provider    diazepam (VALIUM) 5 MG tablet Take 5 mg by mouth every 8 (eight) hours as needed for anxiety.   Yes [provider]  isosorbide mononitrate (IMDUR) 60 MG 24 hr tablet Take 1 tablet (60 mg total) by mouth daily. 04/11/13  Yes Belva Crome, MD  levothyroxine (SYNTHROID, LEVOTHROID) 125 MCG tablet Take 250 mcg by mouth daily. 05/27/17  Yes [provider]  levothyroxine (SYNTHROID, LEVOTHROID) 200 MCG tablet Take 200 mcg by mouth daily before breakfast.   Yes [provider]  losartan (COZAAR) 100 MG tablet Take 100 mg by mouth daily.   Yes [provider]  meclizine (ANTIVERT) 25 MG tablet Take 1 tablet by mouth 4 (four) times daily as needed. 04/12/17  Yes [provider]  metFORMIN (GLUCOPHAGE-XR) 500 MG 24 hr tablet Take 1,000 mg by mouth 2 (two) times daily. 05/16/17  Yes [provider]  metoprolol (TOPROL-XL) 200 MG 24 hr tablet Take 200 mg by mouth daily.     Yes [provider]  mirtazapine (REMERON) 30 MG tablet Take 30 mg by mouth at bedtime.   Yes [provider]  NITROSTAT 0.4 MG SL tablet PLACE 1 TABLET (0.4 MG TOTAL) UNDER THE TONGUE EVERY 5 (FIVE) MINUTES AS NEEDED FOR CHEST PAIN. 03/19/15  Yes Belva Crome, MD  acyclovir (ZOVIRAX) 800 MG tablet Take  1 tablet by mouth 5 (five) times daily. 03/25/17   [provider]  albuterol (PROVENTIL HFA;VENTOLIN HFA) 108 (90 BASE) MCG/ACT inhaler Inhale 2 puffs into the lungs every 4 (four) hours as needed for wheezing.    [provider]  aspirin EC 81 MG tablet Take 81 mg by mouth daily.    [provider]  budesonide-formoterol (SYMBICORT) 160-4.5 MCG/ACT inhaler Inhale 2 puffs into the lungs 2 (two) times daily.    [provider]  Cholecalciferol (VITAMIN D3 SUPER STRENGTH) 2000 units TABS Take 1 tablet by mouth daily.    [provider]  citalopram (CELEXA) 40 MG tablet Take 40 mg by mouth daily. 05/05/17   [provider]   clopidogrel (PLAVIX) 75 MG tablet Take 1 tablet (75 mg total) by mouth daily. Please keep upcoming appt for future refills. Thank you 06/03/17   Belva Crome, MD  cyclobenzaprine (FLEXERIL) 10 MG tablet Take 10 mg by mouth 3 (three) times daily as needed for muscle spasms.    [provider]  diclofenac sodium (VOLTAREN) 1 % GEL APPLY TO AFFECTED AREA(S) THREE TIMES DAILY 04/08/17   [provider]  fluconazole (DIFLUCAN) 150 MG tablet See admin instructions. 05/20/17   [provider]  furosemide (LASIX) 40 MG tablet Take 40 mg by mouth 2 (two) times daily.      [provider]  furosemide (LASIX) 40 MG tablet Take by mouth.    [provider]  gabapentin (NEURONTIN) 300 MG capsule Take 300 mg by mouth at bedtime.    [provider]  HYDROcodone-acetaminophen (NORCO) 10-325 MG per tablet Take 1 tablet by mouth every 6 (six) hours as needed for pain.    [provider]  HYDROcodone-acetaminophen (NORCO) 10-325 MG tablet Take by mouth.    [provider]  hydrOXYzine (ATARAX/VISTARIL) 25 MG tablet Take 1 tablet by mouth 2 (two) times daily. 05/05/17   [provider]  insulin lispro protamine-lispro (HUMALOG 75/25) (75-25) 100 UNIT/ML SUSP Inject 75 Units into the skin 2 (two) times daily with a meal. Takes 75 units in am and 70 units in pm     [provider]  omeprazole (PRILOSEC) 20 MG capsule Take 20 mg by mouth daily.    [provider]  ondansetron (ZOFRAN) 4 MG tablet Take 4 mg by mouth 4 (four) times daily. 05/22/17   [provider]  oxyCODONE (OXYCONTIN) 10 mg 12 hr tablet Take 1 tablet by mouth every 12 (twelve) hours.    [provider]  potassium chloride SA (K-DUR,KLOR-CON) 20 MEQ tablet Take 40 mEq by mouth daily.    [provider]  rosuvastatin (CRESTOR) 40 MG tablet Take 40 mg by mouth daily.      [provider]  tiotropium (SPIRIVA) 18 MCG inhalation  capsule Place 18 mcg into inhaler and inhale daily.    [provider]  tobramycin-dexamethasone [facility]) ophthalmic solution     [provider]    Inpatient Medications: Scheduled Meds: . aspirin EC  81 mg Oral Daily  . cholecalciferol  2,000 Units Oral Daily  . citalopram  40 mg Oral Daily  . clopidogrel  75 mg Oral Daily  . enoxaparin (LOVENOX) injection  40 mg Subcutaneous Q24H  . feeding supplement (ENSURE ENLIVE)  237 mL Oral BID BM  . gabapentin  300 mg Oral QHS  . hydrOXYzine  25 mg Oral BID  . insulin aspart  0-15 Units Subcutaneous TID WC  .  insulin aspart protamine- aspart  75 Units Subcutaneous BID WC  . metFORMIN  1,000 mg Oral BID WC  . mirtazapine  30 mg Oral QHS  . mometasone-formoterol  2 puff Inhalation BID  . oxyCODONE  10 mg Oral Q12H  . rosuvastatin  40 mg Oral Daily  . tiotropium  18 mcg Inhalation Daily   Continuous Infusions: . sodium chloride 125 mL/hr at 06/11/17 0114   PRN Meds: acetaminophen, ALPRAZolam, cyclobenzaprine, nitroGLYCERIN, ondansetron (ZOFRAN) IV  Allergies:    Allergies  Allergen Reactions  . Tape Other (See Comments)    Adhesive breaks me out (rash)    Social History:   Social History   Socioeconomic History  . Marital status: Divorced    Spouse name: Not on file  . Number of children: Not on file  . Years of education: Not on file  . Highest education level: Not on file  Occupational History  . Not on file  Social Needs  . Financial resource strain: Not on file  . Food insecurity:    Worry: Not on file    Inability: Not on file  . Transportation needs:    Medical: Not on file    Non-medical: Not on file  Tobacco Use  . Smoking status: Never Smoker  . Smokeless tobacco: Never Used  Substance and Sexual Activity  . Alcohol use: No    Alcohol/week: 0.0 oz  . Drug use: No  . Sexual activity: Not on file  Lifestyle  . Physical activity:    Days per week: Not on file    Minutes per session:  Not on file  . Stress: Not on file  Relationships  . Social connections:    Talks on phone: Not on file    Gets together: Not on file    Attends religious service: Not on file    Active member of club or organization: Not on file    Attends meetings of clubs or organizations: Not on file    Relationship status: Not on file  . Intimate partner violence:    Fear of current or ex partner: Not on file    Emotionally abused: Not on file    Physically abused: Not on file    Forced sexual activity: Not on file  Other Topics Concern  . Not on file  Social History Narrative  . Not on file    Family History:    Family History  Problem Relation Age of Onset  . COPD Mother   . Heart disease Father   . Heart attack Father      ROS:  Please see the history of present illness.  All other ROS reviewed and negative.     Physical Exam/Data:   Vitals:   06/10/17 2215 06/10/17 2230 06/11/17 0008 06/11/17 0535  BP:  140/69 124/72 (!) 135/58  Pulse: 87 88 89 83  Resp: 15 14 20 14   Temp:   98.6 F (37 C) (!) 97.4 F (36.3 C)  TempSrc:   Oral Oral  SpO2: 96% 95% 96% 95%  Weight:   194 lb 10.7 oz (88.3 kg)   Height:        Intake/Output Summary (Last 24 hours) at 06/11/2017 0904 Last data filed at 06/11/2017 0558 Gross per 24 hour  Intake 1331.67 ml  Output -  Net 1331.67 ml   Filed Weights   06/10/17 1956 06/11/17 0008  Weight: 180 lb (81.6 kg) 194 lb 10.7 oz (88.3 kg)   Body mass index  is 33.41 kg/m.  General:  Overweight female well developed, in no acute distress HEENT: normal Lymph: no adenopathy Neck: no JVD Endocrine:  No thryomegaly Vascular: No carotid bruits; FA pulses 2+ bilaterally without bruits  Cardiac:  normal S1, S2; RRR; no murmur  Lungs:  clear to auscultation bilaterally, no wheezing, rhonchi or rales  Abd: soft, nontender, no hepatomegaly  Ext: no edema Musculoskeletal:  No deformities, BUE and BLE strength normal and equal Skin: warm and dry    Neuro:  CNs 2-12 intact, no focal abnormalities noted Psych:  Normal affect   EKG:  The EKG was personally reviewed and demonstrates:  NSR, RBBB Telemetry:  Telemetry was personally reviewed and demonstrates:  NSR  Relevant CV Studies:   Laboratory Data:  Chemistry Recent Labs  Lab 06/10/17 2013 06/11/17 0525  NA 135 138  K 3.8 3.5  CL 99* 101  CO2 24 28  GLUCOSE 296* 139*  BUN 21* 25*  CREATININE 1.54* 1.55*  CALCIUM 8.8* 8.6*  GFRNONAA 35* 35*  GFRAA 41* 40*  ANIONGAP 12 9    Recent Labs  Lab 06/11/17 0525  PROT 6.1*  ALBUMIN 3.6  AST 27  ALT 27  ALKPHOS 91  BILITOT 0.9   Hematology Recent Labs  Lab 06/10/17 2013  WBC 6.0  RBC 4.44  HGB 13.5  HCT 39.8  MCV 89.6  MCH 30.4  MCHC 33.9  RDW 12.6  PLT 161   Cardiac Enzymes Recent Labs  Lab 06/10/17 2013 06/10/17 2252 06/11/17 0525  TROPONINI <0.03 <0.03 <0.03   No results for input(s): TROPIPOC in the last 168 hours.  BNPNo results for input(s): BNP, PROBNP in the last 168 hours.  DDimer No results for input(s): DDIMER in the last 168 hours.  Radiology/Studies:  Dg Chest 2 View  Result Date: 06/10/2017 CLINICAL DATA:  Chest pain EXAM: CHEST - 2 VIEW COMPARISON:  08/02/2014 FINDINGS: The heart size and mediastinal contours are within normal limits. Both lungs are clear. The visualized skeletal structures are unremarkable. IMPRESSION: No active cardiopulmonary disease. Electronically Signed   By: Ulyses Jarred M.D.   On: 06/10/2017 21:24    Assessment and Plan:   Chest pain with a moderate risk for ACS- MI r/o  CAD- S/p LAD PCI in '05, cath 2014 showed patent LAD stent with distal LAD disease- medical Rx  Renal insufficiency [name] her endocrinologist has mentioned this to her as well.  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 She has been hydrated- NS 125 cc/hr. Will hold Glucophage, Lasix and Cozaar on hold.   For questions or updates, please contact [facility] Please consult [email or web address] for contact info under Cardiology/STEMI.   Angelena Form, PA-C  06/11/2017 9:04 AM   Patient seen and discussed with PA Rosalyn Gess, I agree with his documentation. History of CAD with prior stents to LAD, OSA, DM2, HTN, presents with chest pain concerning for unstable angina.     K 3.8 Cr 1.54 BUN 21 WBC 6 Hgb 13.5 Plt 161 TSH 0.057  Mg 1.5 Tropneg x 3 CXR no acute process EKG SR, RBBB/LAFB, no acute ischemic changes Echo pending  62 yo female with known CAD presents with chest pain concerning for unstable angina. EKG and enzymes benign thus far, however given her history and  description of symptoms concern is high enough to warrant a cath. Will plan for transfer today for cath. AKI, continue IVF. Hold diuretics, ARB. Currently on ASA, plavix, crestor 40. BP meds held due to some hypotension, improving with IVFs, restart low dose Toprol XL 25mg  and titrate back up to home dose over time. Change IVFs from 1/2 NS to NS at 172mL/hr x 1.5 liters.   Carlyle Dolly MD

## 2017-06-11 NOTE — Progress Notes (Signed)
Subjective: She was admitted with chest discomfort dehydration and acute kidney injury, low magnesium which is being replaced.  I had seen her in my office last week and she had a scheduled appointment with her cardiologist towards the end of the month and we were trying to see if we can get her in a little bit earlier because of her chest discomfort.  She says she has had nausea so she has not been eating or drinking very well and I think she was dehydrated.  She still feels weak when she stands and has had some orthostatic changes.  She is still having chest pain.  Troponin is negative so far. Objective: Vital signs in last 24 hours: Temp:  [97.4 F (36.3 C)-98.6 F (37 C)] 97.4 F (36.3 C) (05/10 0535) Pulse Rate:  [83-109] 83 (05/10 0535) Resp:  [11-27] 14 (05/10 0535) BP: (85-140)/(58-81) 135/58 (05/10 0535) SpO2:  [90 %-97 %] 95 % (05/10 0535) Weight:  [81.6 kg (180 lb)-88.3 kg (194 lb 10.7 oz)] 88.3 kg (194 lb 10.7 oz) (05/10 0008) Weight change:  Last BM Date: 06/10/17  Intake/Output from previous day: 05/09 0701 - 05/10 0700 In: 1331.7 [P.O.:240; I.V.:591.7; IV Piggyback:500] Out: -   PHYSICAL EXAM General appearance: alert, cooperative and mild distress Resp: clear to auscultation bilaterally Cardio: regular rate and rhythm, S1, S2 normal, no murmur, click, rub or gallop GI: soft, non-tender; bowel sounds normal; no masses,  no organomegaly Extremities: extremities normal, atraumatic, no cyanosis or edema Skin turgor fair  Lab Results:  Results for orders placed or performed during the hospital encounter of 06/10/17 (from the past 48 hour(s))  Basic metabolic panel     Status: Abnormal   Collection Time: 06/10/17  8:13 PM  Result Value Ref Range   Sodium 135 135 - 145 mmol/L   Potassium 3.8 3.5 - 5.1 mmol/L   Chloride 99 (L) 101 - 111 mmol/L   CO2 24 22 - 32 mmol/L   Glucose, Bld 296 (H) 65 - 99 mg/dL   BUN 21 (H) 6 - 20 mg/dL   Creatinine, Ser 1.54 (H) 0.44 - 1.00  mg/dL   Calcium 8.8 (L) 8.9 - 10.3 mg/dL   GFR calc non Af Amer 35 (L) >60 mL/min   GFR calc Af Amer 41 (L) >60 mL/min    Comment: (NOTE) The eGFR has been calculated using the CKD EPI equation. This calculation has not been validated in all clinical situations. eGFR's persistently <60 mL/min signify possible Chronic Kidney Disease.    Anion gap 12 5 - 15    Comment: Performed at [facility], [address], [place], [place] [address]  CBC     Status: None   Collection Time: 06/10/17  8:13 PM  Result Value Ref Range   WBC 6.0 4.0 - 10.5 K/uL   RBC 4.44 3.87 - 5.11 MIL/uL   Hemoglobin 13.5 12.0 - 15.0 g/dL   HCT 39.8 36.0 - 46.0 %   MCV 89.6 78.0 - 100.0 fL   MCH 30.4 26.0 - 34.0 pg   MCHC 33.9 30.0 - 36.0 g/dL   RDW 12.6 11.5 - 15.5 %   Platelets 161 150 - 400 K/uL    Comment: Performed at [facility], [address], [place], [place] [address]  Troponin I     Status: None   Collection Time: 06/10/17  8:13 PM  Result Value Ref Range   Troponin I <0.03 <0.03 ng/mL    Comment: Performed at [facility],  [address] [place], [place] [address]  Protime-INR     Status: None   Collection Time: 06/10/17  8:13 PM  Result Value Ref Range   Prothrombin Time 11.8 11.4 - 15.2 seconds   INR 0.87     Comment: Performed at [facility], [address] [place], [place] [address]  TSH     Status: Abnormal   Collection Time: 06/10/17  8:13 PM  Result Value Ref Range   TSH 0.057 (L) 0.350 - 4.500 uIU/mL    Comment: Performed by a 3rd Generation assay with a functional sensitivity of <=0.01 uIU/mL. Performed at [facility], [address] [place], [place] [address]   Magnesium     Status: Abnormal   Collection Time: 06/10/17  8:13 PM  Result Value Ref Range   Magnesium 1.5 (L) 1.7 - 2.4 mg/dL    Comment: Performed at [facility], [address] [place], [place] [address]  Phosphorus     Status: None   Collection Time: 06/10/17  8:13 PM  Result Value Ref Range   Phosphorus 3.8 2.5  - 4.6 mg/dL    Comment: Performed at [facility], [address], [place], [place] [address]  Troponin I     Status: None   Collection Time: 06/10/17 10:52 PM  Result Value Ref Range   Troponin I <0.03 <0.03 ng/mL    Comment: Performed at [facility], [address], [place], [place] [address]  CK     Status: Abnormal   Collection Time: 06/10/17 10:52 PM  Result Value Ref Range   Total CK 31 (L) 38 - 234 U/L    Comment: Performed at [facility], [address] [place], [place] [address]  Glucose, capillary     Status: Abnormal   Collection Time: 06/11/17 12:10 AM  Result Value Ref Range   Glucose-Capillary 153 (H) 65 - 99 mg/dL   Comment 1 Notify RN    Comment 2 Document in Chart   Troponin I     Status: None   Collection Time: 06/11/17  5:25 AM  Result Value Ref Range   Troponin I <0.03 <0.03 ng/mL    Comment: Performed at [facility], [address] [place], [place] [address]  Comprehensive metabolic panel     Status: Abnormal   Collection Time: 06/11/17  5:25 AM  Result Value Ref Range   Sodium 138 135 - 145 mmol/L   Potassium 3.5 3.5 - 5.1 mmol/L   Chloride 101 101 - 111 mmol/L   CO2 28 22 - 32 mmol/L   Glucose, Bld 139 (H) 65 - 99 mg/dL   BUN 25 (H) 6 - 20 mg/dL   Creatinine, Ser 1.55 (H) 0.44 - 1.00 mg/dL   Calcium 8.6 (L) 8.9 - 10.3 mg/dL   Total Protein 6.1 (L) 6.5 - 8.1 g/dL   Albumin 3.6 3.5 - 5.0 g/dL   AST 27 15 - 41 U/L   ALT 27 14 - 54 U/L   Alkaline Phosphatase 91 38 - 126 U/L   Total Bilirubin 0.9 0.3 - 1.2 mg/dL   GFR calc non Af Amer 35 (L) >60 mL/min   GFR calc Af Amer 40 (L) >60 mL/min    Comment: (NOTE) The eGFR has been calculated using the CKD EPI equation. This calculation has not been validated in all clinical situations. eGFR's persistently <60 mL/min signify possible Chronic Kidney Disease.    Anion gap 9 5 - 15    Comment: Performed at [facility],  [address], [place], [place] [address]  Glucose, capillary     Status: Abnormal    Collection Time: 06/11/17  7:35 AM  Result Value Ref Range   Glucose-Capillary 132 (H) 65 - 99 mg/dL   Comment 1 Notify RN    Comment 2 Document in Chart     ABGS No results for input(s): PHART, PO2ART, TCO2, HCO3 in the last 72 hours.  Invalid input(s): PCO2 CULTURES No results found for this or any previous visit (from the past 240 hour(s)). Studies/Results: Dg Chest 2 View  Result Date: 06/10/2017 CLINICAL DATA:  Chest pain EXAM: CHEST - 2 VIEW COMPARISON:  08/02/2014 FINDINGS: The heart size and mediastinal contours are within normal limits. Both lungs are clear. The visualized skeletal structures are unremarkable. IMPRESSION: No active cardiopulmonary disease. Electronically Signed   By: Ulyses Jarred M.D.   On: 06/10/2017 21:24    Medications:  Prior to Admission:  Medications Prior to Admission  Medication Sig Dispense Refill Last Dose  . diazepam (VALIUM) 5 MG tablet Take 5 mg by mouth every 8 (eight) hours as needed for anxiety.     . isosorbide mononitrate (IMDUR) 60 MG 24 hr tablet Take 1 tablet (60 mg total) by mouth daily. 90 tablet 0 06/10/2017 at Unknown time  . levothyroxine (SYNTHROID, LEVOTHROID) 125 MCG tablet Take 250 mcg by mouth daily.  5 06/10/2017 at Unknown time  . levothyroxine (SYNTHROID, LEVOTHROID) 200 MCG tablet Take 200 mcg by mouth daily before breakfast.   06/10/2017 at Unknown time  . losartan (COZAAR) 100 MG tablet Take 100 mg by mouth daily.   06/10/2017 at Unknown time  . meclizine (ANTIVERT) 25 MG tablet Take 1 tablet by mouth 4 (four) times daily as needed.  5 06/10/2017 at Unknown time  . metFORMIN (GLUCOPHAGE-XR) 500 MG 24 hr tablet Take 1,000 mg by mouth 2 (two) times daily.  2 06/10/2017 at Unknown time  . metoprolol (TOPROL-XL) 200 MG 24 hr tablet Take 200 mg by mouth daily.     06/10/2017 at Unknown time  . mirtazapine (REMERON) 30 MG tablet Take 30 mg by mouth at bedtime.   06/10/2017 at Unknown time  . NITROSTAT 0.4 MG SL tablet PLACE 1 TABLET (0.4 MG  TOTAL) UNDER THE TONGUE EVERY 5 (FIVE) MINUTES AS NEEDED FOR CHEST PAIN. 25 tablet 2 06/10/2017 at Unknown time  . acyclovir (ZOVIRAX) 800 MG tablet Take 1 tablet by mouth 5 (five) times daily.  3   . albuterol (PROVENTIL HFA;VENTOLIN HFA) 108 (90 BASE) MCG/ACT inhaler Inhale 2 puffs into the lungs every 4 (four) hours as needed for wheezing.   Taking  . aspirin EC 81 MG tablet Take 81 mg by mouth daily.   Taking  . budesonide-formoterol (SYMBICORT) 160-4.5 MCG/ACT inhaler Inhale 2 puffs into the lungs 2 (two) times daily.   Taking  . Cholecalciferol (VITAMIN D3 SUPER STRENGTH) 2000 units TABS Take 1 tablet by mouth daily.     . citalopram (CELEXA) 40 MG tablet Take 40 mg by mouth daily.  3   . clopidogrel (PLAVIX) 75 MG tablet Take 1 tablet (75 mg total) by mouth daily. Please keep upcoming appt for future refills. Thank you 90 tablet 0   . cyclobenzaprine (FLEXERIL) 10 MG tablet Take 10 mg by mouth 3 (three) times daily as needed for muscle spasms.   Taking  . diclofenac sodium (VOLTAREN) 1 % GEL APPLY TO AFFECTED AREA(S) THREE TIMES DAILY  0   . fluconazole (DIFLUCAN) 150 MG  tablet See admin instructions.  5   . furosemide (LASIX) 40 MG tablet Take 40 mg by mouth 2 (two) times daily.     Taking  . furosemide (LASIX) 40 MG tablet Take by mouth.     . gabapentin (NEURONTIN) 300 MG capsule Take 300 mg by mouth at bedtime.   Taking  . HYDROcodone-acetaminophen (NORCO) 10-325 MG per tablet Take 1 tablet by mouth every 6 (six) hours as needed for pain.   Taking  . HYDROcodone-acetaminophen (NORCO) 10-325 MG tablet Take by mouth.     . hydrOXYzine (ATARAX/VISTARIL) 25 MG tablet Take 1 tablet by mouth 2 (two) times daily.  5   . insulin lispro protamine-lispro (HUMALOG 75/25) (75-25) 100 UNIT/ML SUSP Inject 75 Units into the skin 2 (two) times daily with a meal. Takes 75 units in am and 70 units in pm    Taking  . omeprazole (PRILOSEC) 20 MG capsule Take 20 mg by mouth daily.   Taking  . ondansetron  (ZOFRAN) 4 MG tablet Take 4 mg by mouth 4 (four) times daily.  3   . oxyCODONE (OXYCONTIN) 10 mg 12 hr tablet Take 1 tablet by mouth every 12 (twelve) hours.     . potassium chloride SA (K-DUR,KLOR-CON) 20 MEQ tablet Take 40 mEq by mouth daily.   Taking  . rosuvastatin (CRESTOR) 40 MG tablet Take 40 mg by mouth daily.     Taking  . tiotropium (SPIRIVA) 18 MCG inhalation capsule Place 18 mcg into inhaler and inhale daily.   Taking  . tobramycin-dexamethasone (TOBRADEX) ophthalmic solution       Scheduled: . aspirin EC  81 mg Oral Daily  . cholecalciferol  2,000 Units Oral Daily  . citalopram  40 mg Oral Daily  . clopidogrel  75 mg Oral Daily  . enoxaparin (LOVENOX) injection  40 mg Subcutaneous Q24H  . feeding supplement (ENSURE ENLIVE)  237 mL Oral BID BM  . gabapentin  300 mg Oral QHS  . hydrOXYzine  25 mg Oral BID  . insulin aspart  0-15 Units Subcutaneous TID WC  . insulin aspart protamine- aspart  75 Units Subcutaneous BID WC  . metFORMIN  1,000 mg Oral BID WC  . mirtazapine  30 mg Oral QHS  . mometasone-formoterol  2 puff Inhalation BID  . oxyCODONE  10 mg Oral Q12H  . rosuvastatin  40 mg Oral Daily  . tiotropium  18 mcg Inhalation Daily   Continuous: . sodium chloride 125 mL/hr at 06/11/17 0114   QQP:[id number], ALPRAZolam, cyclobenzaprine, nitroGLYCERIN, ondansetron (ZOFRAN) IV  Assesment: She was admitted with chest pain and she is known to have coronary artery occlusive disease.  Troponins are negative so far.  Her chest pain continues.  She feels weak and lightheaded and she is dehydrated so she is receiving IV fluid replacement.  Magnesium level was low and she has received magnesium.  She has abnormal EKG and her right bundle branch block was described as new EKG of 07/05/2015 shows right bundle branch block  Principal Problem:   Chest pain Active Problems:   Coronary artery disease involving native coronary artery of native heart with angina pectoris (HCC)    Hypertension   Thyroid disease   Hyperlipidemia   GERD (gastroesophageal reflux disease)   Sleep apnea   Hypomagnesemia   AKI (acute kidney injury) ([facility])    Plan: Continue IV fluid replenishment.  Scheduled for an echocardiogram.  Cardiology consultation    LOS: 0 days   HAWKINS,EDWARD L 06/11/2017,  8:01 AM

## 2017-06-11 NOTE — Progress Notes (Signed)
Site area: right groin  Site Prior to Removal:  Level 0  Pressure Applied For 20 MINUTES    Minutes Beginning at [place]:   Yes.    Patient Status During Pull:  stable  Post Pull Groin Site:  Level 0, small bruise distal to site related to area sutured. No bruising at cath insertion site   Post Pull Instructions Given:  Yes.    Post Pull Pulses Present:  Yes.    Dressing Applied:  Yes.    Comments:  Rechecked with no change in assessment

## 2017-06-11 NOTE — Interval H&P Note (Signed)
History and Physical Interval Note:  06/11/2017 3:45 PM  [name]  has presented today for surgery, with the diagnosis of cp- concerning for Unstable Angina.   The various methods of treatment have been discussed with the patient and family. After consideration of risks, benefits and other options for treatment, the patient has consented to  Procedure(s): LEFT HEART CATH AND CORONARY ANGIOGRAPHY (N/A)with possible PERCUTANEOUS CORONARY INTERVENTION as a surgical intervention .  The patient's history has been reviewed, patient examined, no change in status, stable for surgery.  I have reviewed the patient's chart and labs.  Questions were answered to the patient's satisfaction.    Cath Lab Visit (complete for each Cath Lab visit)  Clinical Evaluation Leading to the Procedure:   ACS: Yes.    Non-ACS:    Anginal Classification: CCS III  Anti-ischemic medical therapy: Maximal Therapy (2 or more classes of medications)  Non-Invasive Test Results: No non-invasive testing performed  Prior CABG: No previous CABG   Glenetta Hew

## 2017-06-11 NOTE — Progress Notes (Signed)
ANTICOAGULATION CONSULT NOTE - Initial Consult  Pharmacy Consult for Heparin Indication: chest pain/ACS  Allergies  Allergen Reactions  . Tape Other (See Comments)    Adhesive breaks me out (rash)  . Morphine Itching   Patient Measurements: Height: 5\' 4"  (162.6 cm) Weight: 194 lb 10.7 oz (88.3 kg) IBW/kg (Calculated) : 54.7 HEPARIN DW (KG): 74.4   Vital Signs: Temp: 97.4 F (36.3 C) (05/10 0535) Temp Source: Oral (05/10 0535) BP: 135/58 (05/10 0535) Pulse Rate: 83 (05/10 0535)  Labs: Recent Labs    06/10/17 2013 06/10/17 2252 06/11/17 0525  HGB 13.5  --   --   HCT 39.8  --   --   PLT 161  --   --   LABPROT 11.8  --   --   INR 0.87  --   --   CREATININE 1.54*  --  1.55*  CKTOTAL  --  31*  --   TROPONINI <0.03 <0.03 <0.03    Estimated Creatinine Clearance: 40.5 mL/min (A) (by C-G formula based on SCr of 1.55 mg/dL (H)).   Medical History: Past Medical History:  Diagnosis Date  . Asthma   . Chest pain   . Coronary artery disease   . Diabetes mellitus   . GERD (gastroesophageal reflux disease)   . Hypercholesteremia   . Hypertension   . Panic attack   . Sleep apnea   . Thyroid disease    Medications:  Medications Prior to Admission  Medication Sig Dispense Refill Last Dose  . aspirin EC 81 MG tablet Take 81 mg by mouth daily.   06/10/2017 at Unknown time  . budesonide-formoterol (SYMBICORT) 160-4.5 MCG/ACT inhaler Inhale 2 puffs into the lungs 2 (two) times daily.   06/10/2017 at Unknown time  . Cholecalciferol (VITAMIN D3 SUPER STRENGTH) 2000 units TABS Take 1 tablet by mouth daily.   06/10/2017 at Unknown time  . citalopram (CELEXA) 40 MG tablet Take 40 mg by mouth daily.  3 06/10/2017 at Unknown time  . clopidogrel (PLAVIX) 75 MG tablet Take 1 tablet (75 mg total) by mouth daily. Please keep upcoming appt for future refills. Thank you 90 tablet 0 06/10/2017 at 0900  . cyclobenzaprine (FLEXERIL) 10 MG tablet Take 10 mg by mouth 3 (three) times daily as needed for  muscle spasms.   06/10/2017 at Unknown time  . diazepam (VALIUM) 5 MG tablet Take 5 mg by mouth every 8 (eight) hours as needed for anxiety.   06/10/2017 at Unknown time  . diclofenac sodium (VOLTAREN) 1 % GEL APPLY TO AFFECTED AREA(S) THREE TIMES DAILY  0 Past Week at Unknown time  . furosemide (LASIX) 40 MG tablet Take 40 mg by mouth 2 (two) times daily.     06/10/2017 at Unknown time  . gabapentin (NEURONTIN) 300 MG capsule Take 300 mg by mouth at bedtime.   06/10/2017 at Unknown time  . HYDROcodone-acetaminophen (NORCO) 10-325 MG per tablet Take 1 tablet by mouth every 6 (six) hours as needed for pain.   06/10/2017 at Unknown time  . hydrOXYzine (ATARAX/VISTARIL) 25 MG tablet Take 1 tablet by mouth 2 (two) times daily.  5 06/10/2017 at Unknown time  . insulin lispro protamine-lispro (HUMALOG 75/25) (75-25) 100 UNIT/ML SUSP Inject 70-75 Units into the skin 2 (two) times daily with a meal. Takes 75 units in am and 70 units in pm    06/10/2017 at Unknown time  . isosorbide mononitrate (IMDUR) 60 MG 24 hr tablet Take 1 tablet (60 mg total) by  mouth daily. 90 tablet 0 06/10/2017 at Unknown time  . meclizine (ANTIVERT) 25 MG tablet Take 1 tablet by mouth 4 (four) times daily as needed.  5 06/10/2017 at Unknown time  . metoprolol (TOPROL-XL) 200 MG 24 hr tablet Take 200 mg by mouth daily.     06/10/2017 at Unknown time  . mirtazapine (REMERON) 30 MG tablet Take 30 mg by mouth at bedtime.   06/10/2017 at Unknown time  . NITROSTAT 0.4 MG SL tablet PLACE 1 TABLET (0.4 MG TOTAL) UNDER THE TONGUE EVERY 5 (FIVE) MINUTES AS NEEDED FOR CHEST PAIN. 25 tablet 2 06/10/2017 at Unknown time  . omeprazole (PRILOSEC) 20 MG capsule Take 20 mg by mouth daily.   06/10/2017 at Unknown time  . ondansetron (ZOFRAN) 4 MG tablet Take 4 mg by mouth 4 (four) times daily.  3 06/10/2017 at Unknown time  . oxyCODONE (OXYCONTIN) 10 mg 12 hr tablet Take 1 tablet by mouth every 12 (twelve) hours.   06/10/2017 at Unknown time  . potassium chloride SA  (K-DUR,KLOR-CON) 20 MEQ tablet Take 40 mEq by mouth daily as needed (leg cramps).    Past Week at Unknown time  . rosuvastatin (CRESTOR) 40 MG tablet Take 40 mg by mouth daily.     06/10/2017 at Unknown time  . tiotropium (SPIRIVA) 18 MCG inhalation capsule Place 18 mcg into inhaler and inhale daily.   06/10/2017 at Unknown time  . tobramycin-dexamethasone (TOBRADEX) ophthalmic solution Place 1 drop into both eyes daily.    Past Week at Unknown time  . levothyroxine (SYNTHROID, LEVOTHROID) 112 MCG tablet Take by mouth.     . losartan (COZAAR) 25 MG tablet Take by mouth.     . metFORMIN (GLUCOPHAGE) 500 MG tablet Take by mouth.      Assessment: Okay for Protocol, tx to [name] today for further cardiac work-up.  Goal of Therapy:  Heparin level 0.3-0.7 units/ml Monitor platelets by anticoagulation protocol: Yes   Plan:  Give 4000 units bolus x 1 Start heparin infusion at 850 units/hr Check anti-Xa level in 6-8 hours and daily while on heparin Continue to monitor H&H and platelets  [name] 06/11/2017,9:48 AM

## 2017-06-11 NOTE — ED Provider Notes (Signed)
[facility] Provider Note   CSN: [id number] Arrival date & time: 06/10/17  1944     History   Chief Complaint Chief Complaint  Patient presents with  . Chest Pain    HPI [name] is a 62 y.o. female.  HPI Patient presents with chest pain.  History of coronary artery disease with known distal LAD lesion.  Has had some chest pain today and had a syncopal episode.  Has had some chest pain over the last week and has had episodes of passing out.  States she has passed out with exertion. also may have had some decreased oral intake.  Has had some chest pain over the last week also.  Still having some pain and no relief with nitroglycerin at home.  Pain is sharp and dull and goes through to the back at times. Past Medical History:  Diagnosis Date  . Asthma   . Chest pain   . Coronary artery disease   . Diabetes mellitus   . GERD (gastroesophageal reflux disease)   . Hypercholesteremia   . Hypertension   . Panic attack   . Sleep apnea   . Thyroid disease     Patient Active Problem List   Diagnosis Date Noted  . Chest pain 06/10/2017  . GERD (gastroesophageal reflux disease) 06/10/2017  . Sleep apnea 06/10/2017  . Hypomagnesemia 06/10/2017  . AKI (acute kidney injury) ([place]) 06/10/2017  . Hyperlipidemia 01/02/2013  . Chest pain at rest 01/02/2013  . Hypertension   . Thyroid disease   . Coronary artery disease involving native coronary artery of native heart with angina pectoris ([place]) 06/23/2012  . Abnormal cardiovascular stress test 06/23/2012    Past Surgical History:  Procedure Laterality Date  . ABDOMINAL HYSTERECTOMY    . BACK SURGERY    . CHOLECYSTECTOMY    . CORONARY ANGIOPLASTY WITH STENT PLACEMENT    . ELBOW SURGERY    . KNEE SURGERY    . LEFT HEART CATHETERIZATION WITH CORONARY ANGIOGRAM N/A 06/23/2012   Procedure: LEFT HEART CATHETERIZATION WITH CORONARY ANGIOGRAM;  Surgeon: Sinclair Grooms, MD;  Location: [facility] CATH LAB;  Service:  Cardiovascular;  Laterality: N/A;     OB History    Gravida  3   Para  3   Term  3   Preterm      AB      Living        SAB      TAB      Ectopic      Multiple      Live Births               Home Medications    Prior to Admission medications   Medication Sig Start Date End Date Taking? Authorizing Provider  isosorbide mononitrate (IMDUR) 60 MG 24 hr tablet Take 1 tablet (60 mg total) by mouth daily. 04/11/13  Yes Belva Crome, MD  levothyroxine (SYNTHROID, LEVOTHROID) 125 MCG tablet Take 250 mcg by mouth daily. 05/27/17  Yes [provider]  levothyroxine (SYNTHROID, LEVOTHROID) 200 MCG tablet Take 200 mcg by mouth daily before breakfast.   Yes [provider]  losartan (COZAAR) 100 MG tablet Take 100 mg by mouth daily.   Yes [provider]  meclizine (ANTIVERT) 25 MG tablet Take 1 tablet by mouth 4 (four) times daily as needed. 04/12/17  Yes [provider]  metFORMIN (GLUCOPHAGE-XR) 500 MG 24 hr tablet Take 1,000 mg by mouth 2 (  two) times daily. 05/16/17  Yes [provider]  metoprolol (TOPROL-XL) 200 MG 24 hr tablet Take 200 mg by mouth daily.     Yes [provider]  mirtazapine (REMERON) 30 MG tablet Take 30 mg by mouth at bedtime.   Yes [provider]  NITROSTAT 0.4 MG SL tablet PLACE 1 TABLET (0.4 MG TOTAL) UNDER THE TONGUE EVERY 5 (FIVE) MINUTES AS NEEDED FOR CHEST PAIN. 03/19/15  Yes Belva Crome, MD  acyclovir (ZOVIRAX) 800 MG tablet Take 1 tablet by mouth 5 (five) times daily. 03/25/17   [provider]  albuterol (PROVENTIL HFA;VENTOLIN HFA) 108 (90 BASE) MCG/ACT inhaler Inhale 2 puffs into the lungs every 4 (four) hours as needed for wheezing.    [provider]  amLODipine (NORVASC) 10 MG tablet TAKE 1 TABLET (10 MG TOTAL) BY MOUTH DAILY. 07/21/16   Belva Crome, MD  aspirin EC 81 MG tablet Take 81 mg by mouth daily.    [provider]  budesonide-formoterol  (SYMBICORT) 160-4.5 MCG/ACT inhaler Inhale 2 puffs into the lungs 2 (two) times daily.    [provider]  Cholecalciferol (VITAMIN D3 SUPER STRENGTH) 2000 units TABS Take 1 tablet by mouth daily.    [provider]  citalopram (CELEXA) 40 MG tablet Take 40 mg by mouth daily. 05/05/17   [provider]  clopidogrel (PLAVIX) 75 MG tablet Take 1 tablet (75 mg total) by mouth daily. Please keep upcoming appt for future refills. Thank you 06/03/17   Belva Crome, MD  cyclobenzaprine (FLEXERIL) 10 MG tablet Take 10 mg by mouth 3 (three) times daily as needed for muscle spasms.    [provider]  cyclobenzaprine (FLEXERIL) 10 MG tablet Take by mouth.    [provider]  diazepam (VALIUM) 10 MG tablet Take 10 mg by mouth daily.    [provider]  diazepam (VALIUM) 2 MG tablet Take by mouth.    [provider]  diclofenac sodium (VOLTAREN) 1 % GEL APPLY TO AFFECTED AREA(S) THREE TIMES DAILY 04/08/17   [provider]  fluconazole (DIFLUCAN) 150 MG tablet See admin instructions. 05/20/17   [provider]  furosemide (LASIX) 40 MG tablet Take 40 mg by mouth 2 (two) times daily.      [provider]  furosemide (LASIX) 40 MG tablet Take by mouth.    [provider]  gabapentin (NEURONTIN) 300 MG capsule Take 300 mg by mouth at bedtime.    [provider]  HYDROcodone-acetaminophen (NORCO) 10-325 MG per tablet Take 1 tablet by mouth every 6 (six) hours as needed for pain.    [provider]  HYDROcodone-acetaminophen (NORCO) 10-325 MG tablet Take by mouth.    [provider]  hydrOXYzine (ATARAX/VISTARIL) 25 MG tablet Take 1 tablet by mouth 2 (two) times daily. 05/05/17   [provider]  insulin lispro protamine-lispro (HUMALOG 75/25) (75-25) 100 UNIT/ML SUSP Inject 75 Units into the skin 2 (two) times daily with a meal. Takes 75 units in am and 70 units in pm     [provider]  omeprazole (PRILOSEC) 20 MG capsule Take 20 mg by mouth daily.    [provider]  ondansetron (ZOFRAN) 4 MG tablet Take 4 mg by mouth 4 (four) times daily. 05/22/17   [provider]  oxyCODONE (OXYCONTIN) 10 mg 12 hr tablet Take 1 tablet by mouth every 12 (twelve) hours.    [provider]  penicillin v potassium (  VEETID) 500 MG tablet TAKE 2 TABLETS BY MOUTH NOW THEN 1 TABLET FOUR TIMES DAILY FOR SEVEN DAYS 04/29/17   [provider]  potassium chloride SA (K-DUR,KLOR-CON) 20 MEQ tablet Take 40 mEq by mouth daily.    [provider]  rosuvastatin (CRESTOR) 40 MG tablet Take 40 mg by mouth daily.      [provider]  tiotropium (SPIRIVA) 18 MCG inhalation capsule Place 18 mcg into inhaler and inhale daily.    [provider]  tobramycin-dexamethasone [facility]) ophthalmic solution     [provider]    Family History Family History  Problem Relation Age of Onset  . COPD Mother   . Heart disease Father   . Heart attack Father     Social History Social History   Tobacco Use  . Smoking status: Never Smoker  . Smokeless tobacco: Never Used  Substance Use Topics  . Alcohol use: No    Alcohol/week: 0.0 oz  . Drug use: No     Allergies   Tape   Review of Systems Review of Systems  Constitutional: Negative for appetite change.  HENT: Negative for congestion.   Respiratory: Negative for shortness of breath.   Cardiovascular: Positive for chest pain.  Gastrointestinal: Positive for abdominal pain.  Endocrine: Negative for polyuria.  Genitourinary: Negative for flank pain.  Musculoskeletal: Negative for back pain.  Skin: Negative for rash.  Neurological: Positive for syncope and light-headedness.  Psychiatric/Behavioral: Negative for confusion.     Physical Exam Updated Vital Signs BP 124/72 (BP Location: Right Arm)   Pulse 89   Temp 98.6 F (37 C) (Oral)   Resp 20   Ht 5\' 4"   (1.626 m)   Wt 88.3 kg (194 lb 10.7 oz)   SpO2 96%   BMI 33.41 kg/m   Physical Exam  Constitutional: She appears well-developed.  HENT:  Head: Atraumatic.  Eyes: Pupils are equal, round, and reactive to light.  Neck: Neck supple.  Cardiovascular: Normal rate and regular rhythm.  Pulmonary/Chest: Effort normal.  Musculoskeletal:       Right lower leg: She exhibits no edema.       Left lower leg: She exhibits no edema.  Neurological: She is alert.  Skin: Capillary refill takes less than 2 seconds.  Psychiatric: She has a normal mood and affect.     ED Treatments / Results  Labs (all labs ordered are listed, but only abnormal results are displayed) Labs Reviewed  BASIC METABOLIC PANEL - Abnormal; Notable for the following components:      Result Value   Chloride 99 (*)    Glucose, Bld 296 (*)    BUN 21 (*)    Creatinine, Ser 1.54 (*)    Calcium 8.8 (*)    GFR calc non Af Amer 35 (*)    GFR calc Af Amer 41 (*)    All other components within normal limits  TSH - Abnormal; Notable for the following components:   TSH 0.057 (*)    All other components within normal limits  MAGNESIUM - Abnormal; Notable for the following components:   Magnesium 1.5 (*)    All other components within normal limits  CK - Abnormal; Notable for the following components:   Total CK 31 (*)    All other components within normal limits  GLUCOSE, CAPILLARY - Abnormal; Notable for the following components:   Glucose-Capillary 153 (*)    All other components within normal limits  CBC  TROPONIN I  PROTIME-INR  PHOSPHORUS  TROPONIN I  TROPONIN I  HIV ANTIBODY (ROUTINE TESTING)  COMPREHENSIVE METABOLIC PANEL    EKG EKG Interpretation  Date/Time:  Thursday Jun 10 2017 19:54:28 EDT Ventricular Rate:  91 PR Interval:    QRS Duration: 145 QT Interval:  408 QTC Calculation: 502 R Axis:   -63 Text Interpretation:  Sinus rhythm Right bundle branch block Probable anterior infarct, age  indeterminate Baseline wander in lead(s) V6 RBBB is new Confirmed by Davonna Belling [phone]) on 06/10/2017 8:33:41 PM   Radiology Dg Chest 2 View  Result Date: 06/10/2017 CLINICAL DATA:  Chest pain EXAM: CHEST - 2 VIEW COMPARISON:  08/02/2014 FINDINGS: The heart size and mediastinal contours are within normal limits. Both lungs are clear. The visualized skeletal structures are unremarkable. IMPRESSION: No active cardiopulmonary disease. Electronically Signed   By: Ulyses Jarred M.D.   On: 06/10/2017 21:24    Procedures Procedures (including critical care time)  Medications Ordered in ED Medications  acetaminophen (TYLENOL) tablet 650 mg (has no administration in time range)  ondansetron (ZOFRAN) injection 4 mg (has no administration in time range)  enoxaparin (LOVENOX) injection 40 mg (has no administration in time range)  insulin aspart (novoLOG) injection 0-15 Units (has no administration in time range)  ALPRAZolam (XANAX) tablet 0.25 mg (has no administration in time range)  feeding supplement (ENSURE ENLIVE) (ENSURE ENLIVE) liquid 237 mL (has no administration in time range)  magnesium sulfate IVPB 4 g 100 mL (has no administration in time range)  0.45 % sodium chloride infusion (has no administration in time range)  nitroGLYCERIN (NITROSTAT) SL tablet 0.4 mg (has no administration in time range)  nitroGLYCERIN (NITROSTAT) SL tablet 0.4 mg (0.4 mg Sublingual Given 06/10/17 2106)  sodium chloride 0.9 % bolus 500 mL (0 mLs Intravenous Stopped 06/10/17 2211)     Initial Impression / Assessment and Plan / ED Course  I have reviewed the triage vital signs and the nursing notes.  Pertinent labs & imaging results that were available during my care of the patient were reviewed by me and considered in my medical decision making (see chart for details).     Patient with chest pain.  EKG reassuring.  Also has had syncope and is orthostatic.  However did have syncopal episodes and chest pain  with exertion.  With the renal insufficiency and possible dehydration will give fluids and admit to internal medicine.  Final Clinical Impressions(s) / ED Diagnoses   Final diagnoses:  Chest pain, unspecified type  Syncope, unspecified syncope type  Renal insufficiency    ED Discharge Orders    None       Davonna Belling, MD 06/11/17 0028

## 2017-06-11 NOTE — Telephone Encounter (Signed)
Please follow-up with her.

## 2017-06-11 NOTE — Telephone Encounter (Signed)
Pt went to [facility] last night for eval.  Pt currently admitted.

## 2017-06-11 NOTE — Progress Notes (Signed)
Patient received from cath lab at 1709 with complaints of chest pain 5/10, better than in cath lab reported 9/10. Fell asleep and stated that her pain was resolved at 1830. No further chest pain and patient instructed to call with any recurring chest pain.

## 2017-06-12 ENCOUNTER — Other Ambulatory Visit: Payer: Self-pay | Admitting: Physician Assistant

## 2017-06-12 ENCOUNTER — Encounter (HOSPITAL_COMMUNITY): Payer: Self-pay | Admitting: Physician Assistant

## 2017-06-12 ENCOUNTER — Inpatient Hospital Stay (HOSPITAL_COMMUNITY): Payer: Medicare Other

## 2017-06-12 DIAGNOSIS — I340 Nonrheumatic mitral (valve) insufficiency: Secondary | ICD-10-CM

## 2017-06-12 DIAGNOSIS — I25119 Atherosclerotic heart disease of native coronary artery with unspecified angina pectoris: Secondary | ICD-10-CM

## 2017-06-12 LAB — HIV ANTIBODY (ROUTINE TESTING W REFLEX): HIV Screen 4th Generation wRfx: NONREACTIVE

## 2017-06-12 LAB — BASIC METABOLIC PANEL
Anion gap: 6 (ref 5–15)
BUN: 15 mg/dL (ref 6–20)
CO2: 28 mmol/L (ref 22–32)
Calcium: 8.5 mg/dL — ABNORMAL LOW (ref 8.9–10.3)
Chloride: 104 mmol/L (ref 101–111)
Creatinine, Ser: 1.13 mg/dL — ABNORMAL HIGH (ref 0.44–1.00)
GFR calc Af Amer: 59 mL/min — ABNORMAL LOW (ref >60)
GFR calc non Af Amer: 51 mL/min — ABNORMAL LOW (ref >60)
Glucose, Bld: 256 mg/dL — ABNORMAL HIGH (ref 65–99)
Potassium: 4.2 mmol/L (ref 3.5–5.1)
Sodium: 138 mmol/L (ref 135–145)

## 2017-06-12 LAB — CBC
HCT: 35.9 % — ABNORMAL LOW (ref 36.0–46.0)
Hemoglobin: 11.9 g/dL — ABNORMAL LOW (ref 12.0–15.0)
MCH: 30.1 pg (ref 26.0–34.0)
MCHC: 33.1 g/dL (ref 30.0–36.0)
MCV: 90.7 fL (ref 78.0–100.0)
Platelets: 112 10*3/uL — ABNORMAL LOW (ref 150–400)
RBC: 3.96 MIL/uL (ref 3.87–5.11)
RDW: 12.9 % (ref 11.5–15.5)
WBC: 4.6 10*3/uL (ref 4.0–10.5)

## 2017-06-12 LAB — GLUCOSE, CAPILLARY: Glucose-Capillary: 244 mg/dL — ABNORMAL HIGH (ref 65–99)

## 2017-06-12 LAB — ECHOCARDIOGRAM COMPLETE
Height: 64 in
Weight: 3121.71 oz

## 2017-06-12 MED ORDER — METOPROLOL SUCCINATE ER 25 MG PO TB24: 25.0000 mg | ORAL_TABLET | Freq: Every day | ORAL | 3 refills | Status: DC

## 2017-06-12 MED ORDER — PANTOPRAZOLE SODIUM 40 MG PO TBEC: 40.0000 mg | DELAYED_RELEASE_TABLET | Freq: Every day | ORAL | 1 refills | Status: DC

## 2017-06-12 MED ORDER — LOSARTAN POTASSIUM 25 MG PO TABS: 12.5000 mg | ORAL_TABLET | Freq: Every day | ORAL | 3 refills | Status: DC

## 2017-06-12 MED ORDER — LOSARTAN POTASSIUM 25 MG PO TABS: 25.0000 mg | ORAL_TABLET | Freq: Every day | ORAL | 3 refills | Status: DC

## 2017-06-12 NOTE — Discharge Summary (Addendum)
Discharge Summary    Patient ID: [name],  MRN: [id number], DOB/AGE: August 21, 1955 62 y.o.  Admit date: 06/10/2017 Discharge date: 06/12/2017  Primary Care Provider: Sinda Du Primary Cardiologist: Dr. Tamala Julian   Discharge Diagnoses    Principal Problem:   Chest pain with moderate risk of acute coronary syndrome Active Problems:   CAD S/P percutaneous coronary angioplasty   Hypertension   Thyroid disease   Hyperlipidemia   GERD (gastroesophageal reflux disease)   Sleep apnea   Hypomagnesemia   AKI (acute kidney injury) ([place])   Renal insufficiency   Coronary artery disease involving native coronary artery of native heart with unstable angina pectoris ([facility])  Orthostatic hypotension   R sided flank pain  Allergies Allergies  Allergen Reactions  . Tape Other (See Comments)    Adhesive breaks me out (rash)  . Morphine Itching    Diagnostic Studies/Procedures    CORONARY BALLOON ANGIOPLASTY  LEFT HEART CATH AND CORONARY ANGIOGRAPHY  Conclusion     Ost LAD to Prox LAD overlapping Cypher DES stents (mid/overlap segment) are 80% stenosed.  Scoring balloon angioplasty was performed using a BALLOON [place] 3.00X15 followed by post-dilation with a BALLOON SAPPHIRE [place] 3.5X15.  Post intervention, there is a 0% residual stenosis.  Mid LAD lesion is 50% stenosed. Distal LAD is diffusely diseased with up to 20% stenosis.  The left ventricular systolic function is normal. The left ventricular ejection fraction is greater than 65% by visual estimate.  LV end diastolic pressure is moderately elevated.  Severe ISR of overlapping Cypher DES in p-mLAD -- successful cutting balloon PTCA & post-dilation to 3.6 mm. Diffuse mid-distal LAD disease - no change. Otherwise no notable Ramus, Cx-OM or RCA disease  Plan: Admit overnight to 6 central procedure unit for sheath removal Anticipated discharge tomorrow morning   Diagnostic Diagram         Post-Intervention Diagram         History of Present Illness     62 y.o. female with hx of CAD, DM, HTN, HLD and OSA non compliant with CPAP transferred from [facility] for cath.   She had an LAD PCI in 2005. Cath done in 2014 after an abnormal Myoview revealed patent pLAD stent but distal LAD disease. The plan is for medical Rx. She had done well on medications till this past week when she noted SSCP that radiated to her back. She says its worse with exertion and associated with diaphoresis. On one occasion she says she "passed out" carrying laundry to her car. She has taken NTG at home-"didn't help". She called the office and was instructed to go to the closest ED. She was admitted to [facility]. Her Troponin have been negative, EKG shows no acute changes from prior.  Hospital Course     Consultants: IM as attending at [facility]  1. Unstable angina - Troponin negative. Cath showed severe ISR of overlapping Cypher DES in p-mLAD -- successful cutting balloon PTCA & post-dilation to 3.6 mm. Diffuse mid-distal LAD disease - no change. - LVEDP of 22 by cath. Echo done just prior to discharge. Pending reading.  - Continue ASA, Plavix, BB and statin. Stop Imdur. No recurrent angina. Ambulated well.   2. CAD stage III - Scr improved to 1.13 from 1.55 post hydration. Resume lasix and Losartan (half dose).   3. HLD - Continue Crestor 40mg  qd.   4. Dizziness upon standing/ Hypotension - BP meds held due to some hypotension at [facility], improving with IVFs. Restarted  Toprol XL at lower dose of 25mg  qd from 200mg  qd. Uptitrate as outpatient.  At [facility], She complained dizziness with standing for the past 1 week. Her SBP dropped from 178 to 158 upon standing with dizziness during vital but felt better in few minutes. Advised to get balance prior to movement. Given elevated BP will consult Toprol at 25mg  and restart Losartan  at 25mg  qd. Adjust as needed.   5. Hypothyroidism - TSH low at admission. Her  synthroid held for 2-3 days. Resume at home dose at discharge. She will follow up with PCP Monday for further adjustment.   6. R sided mild flank pain - She has chronic back pain and takes multiple narcotics. F/u with PCP as outpatient.    The patient has been seen by Dr. Johnsie Cancel today and deemed ready for discharge home. All follow-up appointments have been scheduled. Discharge medications are listed below.    Discharge Vitals Blood pressure (!) 180/62, pulse 91, temperature 97.7 F (36.5 C), temperature source Oral, resp. rate 17, height 5\' 4"  (1.626 m), weight 195 lb 1.7 oz (88.5 kg), SpO2 99 %.  Filed Weights   06/10/17 1956 06/11/17 0008 06/12/17 0139  Weight: 180 lb (81.6 kg) 194 lb 10.7 oz (88.3 kg) 195 lb 1.7 oz (88.5 kg)    Labs & Radiologic Studies     CBC Recent Labs    06/10/17 2013 06/12/17 0333  WBC 6.0 4.6  HGB 13.5 11.9*  HCT 39.8 35.9*  MCV 89.6 90.7  PLT 161 562*   Basic Metabolic Panel Recent Labs    06/10/17 2013 06/11/17 0525 06/12/17 0333  NA 135 138 138  K 3.8 3.5 4.2  CL 99* 101 104  CO2 24 28 28   GLUCOSE 296* 139* 256*  BUN 21* 25* 15  CREATININE 1.54* 1.55* 1.13*  CALCIUM 8.8* 8.6* 8.5*  MG 1.5*  --   --   PHOS 3.8  --   --    Liver Function Tests Recent Labs    06/11/17 0525  AST 27  ALT 27  ALKPHOS 91  BILITOT 0.9  PROT 6.1*  ALBUMIN 3.6   No results for input(s): LIPASE, AMYLASE in the last 72 hours. Cardiac Enzymes Recent Labs    06/10/17 2013 06/10/17 2252 06/11/17 0525  CKTOTAL  --  31*  --   TROPONINI <0.03 <0.03 <0.03   Thyroid Function Tests Recent Labs    06/10/17 2013  TSH 0.057*    Dg Chest 2 View  Result Date: 06/10/2017 CLINICAL DATA:  Chest pain EXAM: CHEST - 2 VIEW COMPARISON:  08/02/2014 FINDINGS: The heart size and mediastinal contours are within normal limits. Both lungs are clear. The visualized skeletal structures are unremarkable. IMPRESSION: No active cardiopulmonary disease. Electronically  Signed   By: Ulyses Jarred M.D.   On: 06/10/2017 21:24    Disposition   Pt is being discharged home today in good condition.  Follow-up Plans & Appointments    Follow-up Information    [facility].   Specialty:  Cardiology Why:  office will call with time and date of appointment  Contact information: [place]       Sinda Du, MD. Schedule an appointment as soon as possible for a visit on 06/14/2017.   Specialty:  Pulmonary Disease Why:  for hosptial follow up for abnormal thyroid level and possible UTI Contact information: [address] [place] [address] [phone]  Discharge Instructions    Amb Referral to Cardiac Rehabilitation   Complete by:  As directed    Diagnosis:  PTCA   Diet - low sodium heart healthy   Complete by:  As directed    Discharge instructions   Complete by:  As directed    No driving for 48 hours. No lifting over 5 lbs for 1 week. No sexual activity for 1 week.  Keep procedure site clean & dry. If you notice increased pain, swelling, bleeding or pus, call/return!  You may shower, but no soaking baths/hot tubs/pools for 1 week.   Hold metformin 5/11 & 5/12. Resume on 06/14/17. Follow up with PCP in 3 days for post hospital follow up, possible UTI and abnormal thyroid level. Will need BMET at that time.  Some studies suggest Prilosec/Omeprazole interacts with Plavix. We changed your Prilosec/Omeprazole to Protonix for less chance of interaction.   Increase activity slowly   Complete by:  As directed       Discharge Medications   Allergies as of 06/12/2017      Reactions   Tape Other (See Comments)   Adhesive breaks me out (rash)   Morphine Itching      Medication List    STOP taking these medications   isosorbide mononitrate 60 MG 24 hr tablet Commonly known as:  IMDUR   omeprazole 20 MG capsule Commonly known as:   PRILOSEC     TAKE these medications   aspirin EC 81 MG tablet Take 81 mg by mouth daily. Notes to patient:  Prevents clotting in the stent and heart attack   budesonide-formoterol 160-4.5 MCG/ACT inhaler Commonly known as:  SYMBICORT Inhale 2 puffs into the lungs 2 (two) times daily. Notes to patient:  Prevents shortness of breath and wheezing    citalopram 40 MG tablet Commonly known as:  CELEXA Take 40 mg by mouth daily. Notes to patient:  For depression/pain   clopidogrel 75 MG tablet Commonly known as:  PLAVIX Take 1 tablet (75 mg total) by mouth daily. Please keep upcoming appt for future refills. Thank you Notes to patient:  Prevents clotting in the stent and heart attack   cyclobenzaprine 10 MG tablet Commonly known as:  FLEXERIL Take 10 mg by mouth 3 (three) times daily as needed for muscle spasms. Notes to patient:  As needed for muscle spasms   diazepam 5 MG tablet Commonly known as:  VALIUM Take 5 mg by mouth every 8 (eight) hours as needed for anxiety. Notes to patient:  As needed for anxiety   diclofenac sodium 1 % Gel Commonly known as:  VOLTAREN APPLY TO AFFECTED AREA(S) THREE TIMES DAILY Notes to patient:  As needed for pain 3 X daily   furosemide 40 MG tablet Commonly known as:  LASIX Take 40 mg by mouth 2 (two) times daily. Notes to patient:  Fluid medication    gabapentin 300 MG capsule Commonly known as:  NEURONTIN Take 300 mg by mouth at bedtime. Notes to patient:  Nerve pain    HYDROcodone-acetaminophen 10-325 MG tablet Commonly known as:  NORCO Take 1 tablet by mouth every 6 (six) hours as needed for pain. Notes to patient:  As needed for pain    hydrOXYzine 25 MG tablet Commonly known as:  ATARAX/VISTARIL Take 1 tablet by mouth 2 (two) times daily. Notes to patient:  Prevents itchy feeling    insulin lispro protamine-lispro (75-25) 100 UNIT/ML Susp injection Commonly known as:  HUMALOG 75/25 MIX Inject 70-75 Units  into the skin 2  (two) times daily with a meal. Takes 75 units in am and 70 units in pm Notes to patient:  Controls blood sugar   levothyroxine 112 MCG tablet Commonly known as:  SYNTHROID, LEVOTHROID Take 224 mcg by mouth daily before breakfast.   losartan 25 MG tablet Commonly known as:  COZAAR Take 1 tablet (25 mg total) by mouth daily.   meclizine 25 MG tablet Commonly known as:  ANTIVERT Take 1 tablet by mouth 4 (four) times daily as needed. Notes to patient:  As needed for dizziness    metFORMIN 500 MG tablet Commonly known as:  GLUCOPHAGE Take 500 mg by mouth 2 (two) times daily with a meal. Notes to patient:  Controls blood sugar Hold today and tomorrow to protect kidneys from injury   metoprolol succinate 25 MG 24 hr tablet Commonly known as:  TOPROL-XL Take 1 tablet (25 mg total) by mouth daily. What changed:    medication strength  how much to take   mirtazapine 30 MG tablet Commonly known as:  REMERON Take 30 mg by mouth at bedtime.   NITROSTAT 0.4 MG SL tablet Generic drug:  nitroGLYCERIN PLACE 1 TABLET (0.4 MG TOTAL) UNDER THE TONGUE EVERY 5 (FIVE) MINUTES AS NEEDED FOR CHEST PAIN.   ondansetron 4 MG tablet Commonly known as:  ZOFRAN Take 4 mg by mouth 4 (four) times daily.   oxyCODONE 10 mg 12 hr tablet Commonly known as:  OXYCONTIN Take 1 tablet by mouth every 12 (twelve) hours.   pantoprazole 40 MG tablet Commonly known as:  PROTONIX Take 1 tablet (40 mg total) by mouth daily.   potassium chloride SA 20 MEQ tablet Commonly known as:  K-DUR,KLOR-CON Take 40 mEq by mouth daily as needed (leg cramps).   rosuvastatin 40 MG tablet Commonly known as:  CRESTOR Take 40 mg by mouth daily.   tiotropium 18 MCG inhalation capsule Commonly known as:  SPIRIVA Place 18 mcg into inhaler and inhale daily.   tobramycin-dexamethasone ophthalmic solution Commonly known as:  TOBRADEX Place 1 drop into both eyes daily.   VITAMIN D3 SUPER STRENGTH 2000 units Tabs Generic  drug:  Cholecalciferol Take 1 tablet by mouth daily.        Outstanding Labs/Studies   Pending echo reading BMET during follow  Up with PCP  Duration of Discharge Encounter   Greater than 30 minutes including physician time.  Signed, Bhavinkumar Bhagat PA-C 06/12/2017, 11:05 AM

## 2017-06-12 NOTE — Discharge Summary (Addendum)
Progress Note  Patient Name: [name] Date of Encounter: 06/12/2017  Primary Cardiologist: Sinclair Grooms, MD   Subjective   No SOB. Intermittent chronic chest pain. Had dizziness on standing.   Inpatient Medications    Scheduled Meds: . aspirin EC  81 mg Oral Daily  . cholecalciferol  2,000 Units Oral Daily  . citalopram  40 mg Oral Daily  . clopidogrel  75 mg Oral Daily  . feeding supplement (ENSURE ENLIVE)  237 mL Oral BID BM  . gabapentin  300 mg Oral QHS  . hydrOXYzine  25 mg Oral BID  . insulin aspart  0-15 Units Subcutaneous TID WC  . insulin aspart protamine- aspart  75 Units Subcutaneous BID WC  . levothyroxine  224 mcg Oral QAC breakfast  . metoprolol succinate  25 mg Oral Daily  . mirtazapine  30 mg Oral QHS  . mometasone-formoterol  2 puff Inhalation BID  . nitroGLYCERIN  0.5 inch Topical Q6H  . oxyCODONE  10 mg Oral Q12H  . rosuvastatin  40 mg Oral Daily  . sodium chloride flush  3 mL Intravenous Q12H  . tiotropium  18 mcg Inhalation Daily   Continuous Infusions: . sodium chloride     PRN Meds: sodium chloride, acetaminophen, cyclobenzaprine, diazepam, morphine injection, nitroGLYCERIN, ondansetron (ZOFRAN) IV, sodium chloride flush   Vital Signs    Vitals:   06/11/17 1950 06/11/17 1955 06/11/17 2000 06/12/17 0139  BP: 134/62 (!) 138/58 139/60 (!) 174/65  Pulse: 78 78 79 98  Resp: 17 11 11 14   Temp:   98.2 F (36.8 C) 98.2 F (36.8 C)  TempSrc:   Oral Oral  SpO2: 93% 94% 95% 95%  Weight:    195 lb 1.7 oz (88.5 kg)  Height:        Intake/Output Summary (Last 24 hours) at 06/12/2017 0718 Last data filed at 06/12/2017 0148 Gross per 24 hour  Intake 2560.25 ml  Output 1100 ml  Net 1460.25 ml   Filed Weights   06/10/17 1956 06/11/17 0008 06/12/17 0139  Weight: 180 lb (81.6 kg) 194 lb 10.7 oz (88.3 kg) 195 lb 1.7 oz (88.5 kg)    Telemetry    Sr - Personally Reviewed  ECG    SR with bifaciluar block- Personally  Reviewed  Physical Exam   GEN: No acute distress.   Neck: No JVD Cardiac: RRR, no murmurs, rubs, or gallops. R groin cath site stable Respiratory: Clear to auscultation bilaterally. GI: Soft, nontender, non-distended  MS: No edema; No deformity. Neuro:  Nonfocal  Psych: Normal affect   Labs    Chemistry Recent Labs  Lab 06/10/17 2013 06/11/17 0525 06/12/17 0333  NA 135 138 138  K 3.8 3.5 4.2  CL 99* 101 104  CO2 24 28 28   GLUCOSE 296* 139* 256*  BUN 21* 25* 15  CREATININE 1.54* 1.55* 1.13*  CALCIUM 8.8* 8.6* 8.5*  PROT  --  6.1*  --   ALBUMIN  --  3.6  --   AST  --  27  --   ALT  --  27  --   ALKPHOS  --  91  --   BILITOT  --  0.9  --   GFRNONAA 35* 35* 51*  GFRAA 41* 40* 59*  ANIONGAP 12 9 6      Hematology Recent Labs  Lab 06/10/17 2013 06/12/17 0333  WBC 6.0 4.6  RBC 4.44 3.96  HGB 13.5 11.9*  HCT 39.8 35.9*  MCV  89.6 90.7  MCH 30.4 30.1  MCHC 33.9 33.1  RDW 12.6 12.9  PLT 161 112*    Cardiac Enzymes Recent Labs  Lab 06/10/17 2013 06/10/17 2252 06/11/17 0525  TROPONINI <0.03 <0.03 <0.03   No results for input(s): TROPIPOC in the last 168 hours.   BNPNo results for input(s): BNP, PROBNP in the last 168 hours.   DDimer No results for input(s): DDIMER in the last 168 hours.   Radiology    Dg Chest 2 View  Result Date: 06/10/2017 CLINICAL DATA:  Chest pain EXAM: CHEST - 2 VIEW COMPARISON:  08/02/2014 FINDINGS: The heart size and mediastinal contours are within normal limits. Both lungs are clear. The visualized skeletal structures are unremarkable. IMPRESSION: No active cardiopulmonary disease. Electronically Signed   By: Ulyses Jarred M.D.   On: 06/10/2017 21:24    Cardiac Studies   CORONARY BALLOON ANGIOPLASTY  LEFT HEART CATH AND CORONARY ANGIOGRAPHY  Conclusion     Ost LAD to Prox LAD overlapping Cypher DES stents (mid/overlap segment) are 80% stenosed.  Scoring balloon angioplasty was performed using a BALLOON WOLVERINE 3.00X15  followed by post-dilation with a BALLOON SAPPHIRE [place] 3.5X15.  Post intervention, there is a 0% residual stenosis.  Mid LAD lesion is 50% stenosed. Distal LAD is diffusely diseased with up to 20% stenosis.  The left ventricular systolic function is normal. The left ventricular ejection fraction is greater than 65% by visual estimate.  LV end diastolic pressure is moderately elevated.   Severe ISR of overlapping Cypher DES in p-mLAD -- successful cutting balloon PTCA & post-dilation to 3.6 mm. Diffuse mid-distal LAD disease - no change. Otherwise no notable Ramus, Cx-OM or RCA disease  Plan: Admit overnight to 6 central procedure unit for sheath removal Anticipated discharge tomorrow morning   Diagnostic Diagram       Post-Intervention Diagram          Patient Profile     62 y.o. female with hx of CAD, DM, HTN, HLD and OSA non compliant with CPAP transferred from [facility] for cath.   Assessment & Plan    1. Unstable angina - Troponin negative. Cath showed severe ISR of overlapping Cypher DES in p-mLAD -- successful cutting balloon PTCA & post-dilation to 3.6 mm. Diffuse mid-distal LAD disease - no change. - LVEDP of 22 by cath. Pending echo. Can be done as outpatient. Resume home lasix.  - Continue ASA, Plavix, BB and statin.   2. CAD stage III - Scr improved to 1.13 from 1.55 post hydration. Resume lasix and Losartan.   3. HLD - Continue Crestor 40mg  qd.   4. Dizziness upon standing - Get ortho vitals.   For questions or updates, please contact [place] Please consult [email or web address] for contact info under Cardiology/STEMI.      SignedLeanor Kail, PA  06/12/2017, 7:18 AM    Patient examined chart reviewed No angina Has ambulated RFA cath sight A with no hematoma Lungs clear no murmur good distal pulses.  D/C home f/u [place] in [facility]

## 2017-06-12 NOTE — Progress Notes (Signed)
CARDIAC REHAB PHASE I   PRE:  Rate/Rhythm: Sinus 88   BP:    Sitting: 174/82     SaO2: 96% Room Air  MODE:  Ambulation: 300 ft   POST:  Rate/Rhythem: 92  BP:   Sitting: 185/76 regular cuff 177/71 with large cuff   SaO2: 96%  0900-0955 patient complained of feeling lightheaded during ambulation. Patient reports that this has been going on for over a week. [name] RN aware. Upon return to reviewed heart healthy diabetic diet with patient. Use of sublingual nitroglycerin and when to call 911. Discussed exercise instructions reviewed end points of exercise with patient. [name] says she is interested in going to the [facility]. Patient given a brochure for cardiac rehab at [facility]. Referral placed if she changes her mind.   Harrell Gave RN BSN

## 2017-06-12 NOTE — Progress Notes (Signed)
Rechecked BP several hours after medications given and patients BP was 180/62. Notified B. Bhagat PA. Instructed to tell patient to restart cozaar at home and can increase to 50 mg if needed and follow up with PCP on Monday for further instructions. Will instruct patient to keep a log of blood pressures over the weekend to review trends with PCP on Monday as well.

## 2017-06-12 NOTE — Progress Notes (Signed)
  Echocardiogram 2D Echocardiogram has been performed.  [name] 06/12/2017, 8:47 AM

## 2017-06-14 ENCOUNTER — Encounter (HOSPITAL_COMMUNITY): Payer: Self-pay | Admitting: Cardiology

## 2017-06-14 NOTE — Telephone Encounter (Signed)
She was admitted and had cath.  ISR.

## 2017-06-15 MED FILL — Heparin Sod (Porcine)-NaCl IV Soln 1000 Unit/500ML-0.9%: INTRAVENOUS | Qty: 1000 | Status: AC

## 2017-06-15 NOTE — Telephone Encounter (Signed)
Please check with pt on her follow up appt, keep appt with Dr. Tamala Julian in June but should be seen in 2 weeks by APP does she want here are [place]?

## 2017-06-15 NOTE — Telephone Encounter (Signed)
Reached out to pt to check on her re: her recent hospitalization and her follow up appt. Left a message for pt to call back.

## 2017-06-15 NOTE — Telephone Encounter (Signed)
Thanks

## 2017-06-22 NOTE — Telephone Encounter (Signed)
Let message for to call back re: her recent hospitalization and f/u appt.

## 2017-06-30 NOTE — Telephone Encounter (Signed)
Have made several attempts to reach pt, left several messages, per Cecilie Kicks, NP to get pt a f/ua ppt re: her recent hospitalization in [place]. Pt has not contacted [place] back. Left 1 more message today. Will close this encounter and let the provider know.

## 2017-07-05 DIAGNOSIS — I129 Hypertensive chronic kidney disease with stage 1 through stage 4 chronic kidney disease, or unspecified chronic kidney disease: Secondary | ICD-10-CM | POA: Diagnosis not present

## 2017-07-05 DIAGNOSIS — E1121 Type 2 diabetes mellitus with diabetic nephropathy: Secondary | ICD-10-CM | POA: Diagnosis not present

## 2017-07-05 DIAGNOSIS — I2510 Atherosclerotic heart disease of native coronary artery without angina pectoris: Secondary | ICD-10-CM | POA: Diagnosis not present

## 2017-07-06 ENCOUNTER — Other Ambulatory Visit: Payer: Self-pay | Admitting: Physician Assistant

## 2017-07-30 ENCOUNTER — Other Ambulatory Visit: Payer: Self-pay | Admitting: Physician Assistant

## 2017-07-30 ENCOUNTER — Ambulatory Visit: Payer: Medicare Other | Admitting: Interventional Cardiology

## 2017-08-12 DIAGNOSIS — I10 Essential (primary) hypertension: Secondary | ICD-10-CM | POA: Diagnosis not present

## 2017-08-12 DIAGNOSIS — Z634 Disappearance and death of family member: Secondary | ICD-10-CM | POA: Diagnosis not present

## 2017-08-12 DIAGNOSIS — I129 Hypertensive chronic kidney disease with stage 1 through stage 4 chronic kidney disease, or unspecified chronic kidney disease: Secondary | ICD-10-CM | POA: Diagnosis not present

## 2017-08-12 DIAGNOSIS — E1121 Type 2 diabetes mellitus with diabetic nephropathy: Secondary | ICD-10-CM | POA: Diagnosis not present

## 2017-08-20 ENCOUNTER — Other Ambulatory Visit: Payer: Self-pay | Admitting: Physician Assistant

## 2017-09-03 ENCOUNTER — Inpatient Hospital Stay: Admission: AD | Admit: 2017-09-03 | Payer: Medicare Other | Source: Ambulatory Visit | Admitting: Cardiology

## 2017-09-03 ENCOUNTER — Ambulatory Visit (INDEPENDENT_AMBULATORY_CARE_PROVIDER_SITE_OTHER): Payer: Medicare Other | Admitting: Cardiology

## 2017-09-03 ENCOUNTER — Other Ambulatory Visit: Payer: Self-pay | Admitting: Cardiology

## 2017-09-03 ENCOUNTER — Encounter: Payer: Self-pay | Admitting: Cardiology

## 2017-09-03 VITALS — BP 166/88 | HR 110 | Ht 64.0 in | Wt 203.8 lb

## 2017-09-03 DIAGNOSIS — E782 Mixed hyperlipidemia: Secondary | ICD-10-CM | POA: Diagnosis not present

## 2017-09-03 DIAGNOSIS — I200 Unstable angina: Secondary | ICD-10-CM

## 2017-09-03 DIAGNOSIS — E119 Type 2 diabetes mellitus without complications: Secondary | ICD-10-CM

## 2017-09-03 DIAGNOSIS — I10 Essential (primary) hypertension: Secondary | ICD-10-CM

## 2017-09-03 DIAGNOSIS — I2583 Coronary atherosclerosis due to lipid rich plaque: Secondary | ICD-10-CM | POA: Diagnosis not present

## 2017-09-03 DIAGNOSIS — Z794 Long term (current) use of insulin: Secondary | ICD-10-CM

## 2017-09-03 DIAGNOSIS — I2510 Atherosclerotic heart disease of native coronary artery without angina pectoris: Secondary | ICD-10-CM

## 2017-09-03 MED ORDER — NITROGLYCERIN 0.4 MG SL SUBL: SUBLINGUAL_TABLET | SUBLINGUAL | 3 refills | Status: DC

## 2017-09-03 MED ORDER — NITROGLYCERIN 0.4 MG SL SUBL
0.4000 mg | SUBLINGUAL_TABLET | SUBLINGUAL | Status: DC | PRN
  Administered 2017-09-03: 0.4 mg via SUBLINGUAL

## 2017-09-03 MED ORDER — ISOSORBIDE MONONITRATE ER 60 MG PO TB24: 60.0000 mg | ORAL_TABLET | Freq: Every day | ORAL | 3 refills | Status: DC

## 2017-09-03 MED ORDER — ASPIRIN 81 MG PO CHEW
243.0000 mg | CHEWABLE_TABLET | Freq: Once | ORAL | Status: AC
  Administered 2017-09-03: 243 mg via ORAL

## 2017-09-03 NOTE — Patient Instructions (Addendum)
Medication Instructions:  1. START IMDUR 60 MG DAILY.  Labwork: TODAY: CBC, BMET   Testing/Procedures:  Your physician has requested that you have a cardiac catheterization. Cardiac catheterization is used to diagnose and/or treat various heart conditions. Doctors may recommend this procedure for a number of different reasons. The most common reason is to evaluate chest pain. Chest pain can be a symptom of coronary artery disease (CAD), and cardiac catheterization can show whether plaque is narrowing or blocking your heart's arteries. This procedure is also used to evaluate the valves, as well as measure the blood flow and oxygen levels in different parts of your heart. For further information please visit [email or web address]. Please follow instruction sheet, as given.  Follow-Up: Your physician recommends that you schedule a follow-up appointment with Cecilie Kicks, NP on august 22 th at 2 p.m.    Any Other Special Instructions Will Be Listed Below (If Applicable).     If you need a refill on your cardiac medications before your next appointment, please call your pharmacy.      [place] OFFICE [address], [place] [address] Dept: [phone] Loc: [phone]  MILAYA HORA  09/03/2017  You are scheduled for a Cardiac Catheterization on Tuesday, August 6 with Dr. Larae Grooms.  1. Please arrive at the [facility] (Main Entrance A) at [facility]: [address] [place], [place] [address] at 5:30 AM (This time is two hours before your procedure to ensure your preparation). Free valet parking service is available.   Special note: Every effort is made to have your procedure done on time. Please understand that emergencies sometimes delay scheduled procedures.  2. Diet: Do not eat solid foods after midnight.  The patient may have clear liquids until 5am upon the day of  the procedure.  3. Labs: You will need to have blood drawn on Friday, August 2 at [facility] at [facility]. [address]  Open: 7:30am - 5pm    Phone: [phone]. You do not need to be fasting.  4. Medication instructions in preparation for your procedure:  HOLD YOUR LASIX THE MORNING OF YOUR TEST.          Take only 35 units of insulin the night before your procedure. Do not take any insulin on the day of the procedure.  DO NOT TAKE YOUR METFORMIN THE MORNING OF PROCEDURE. YOU MAY RESUME 48 HOURS AFTER YOUR PROCEDURE.   On the morning of your procedure, take your Aspirin, Plavix and any morning medicines NOT listed above.  You may use sips of water.  5. Plan for one night stay--bring personal belongings. 6. Bring a current list of your medications and current insurance cards. 7. You MUST have a responsible person to drive you home. 8. Someone MUST be with you the first 24 hours after you arrive home or your discharge will be delayed. 9. Please wear clothes that are easy to get on and off and wear slip-on shoes.  Thank you for allowing [place] to care for you!   -- [facility]

## 2017-09-03 NOTE — Progress Notes (Signed)
Cardiology Office Note   Date:  09/03/2017   ID:  Ingra, Rother [birth year], MRN [id number]  PCP:  Sinda Du, MD  Cardiologist:  Dr. Tamala Julian    Chief Complaint  Patient presents with  . Chest Pain      History of Present Illness: [name] is a 62 y.o. female who presents for post hospitalization from 06/2017.    She has a hx of asthma, DM-2, GERD, HLD, panic attacks, sleep apnea, thyroid disease and   Hx of CAD with PCI to LAD in 2005   And most recent in 06/2017 she had admit wityh chest pain and near syncope and under went cardiac cath and had severe ISR of overlapping cyper DES in p-mLAD and underwent scoring balloon angioplasty and PTCA.  She also had RBBB on EKG that was new.   Also with orthostatic hypotension.     Today she has been having chest pain and DOE, the pain has awakened her from sleep. She does have nausea with it as well.  This has been on going for a few weeks.  She is currently having pain here in the office EKG without acute changes, + RBBB which has been present.  No palpitations, occ feels lightheaded.  BP is up some today.  Pt drove herself to office today.       Past Medical History:  Diagnosis Date  . Asthma   . Chest pain   . Coronary artery disease    a. LAD PCI in 2005 b. cath in 2014 showed patent pLAD stent c. Cath 06/11/17 severe ISR of overlapping Cypher DES in pLAD s/p cutting ballon PTCA only; mild dLAD dz  . Diabetes mellitus   . GERD (gastroesophageal reflux disease)   . Hypercholesteremia   . Hypertension   . Panic attack   . Sleep apnea   . Thyroid disease     Past Surgical History:  Procedure Laterality Date  . ABDOMINAL HYSTERECTOMY    . BACK SURGERY    . CHOLECYSTECTOMY    . CORONARY ANGIOPLASTY WITH STENT PLACEMENT    . CORONARY BALLOON ANGIOPLASTY N/A 06/11/2017   Procedure: CORONARY BALLOON ANGIOPLASTY;  Surgeon: Leonie Man, MD;  Location: [facility];  Service: Cardiovascular;  Laterality: N/A;   LAD  . ELBOW SURGERY    . KNEE SURGERY    . LEFT HEART CATH AND CORONARY ANGIOGRAPHY N/A 06/11/2017   Procedure: LEFT HEART CATH AND CORONARY ANGIOGRAPHY;  Surgeon: Leonie Man, MD;  Location: [facility] [place] [facility];  Service: Cardiovascular;  Laterality: N/A;  . LEFT HEART CATHETERIZATION WITH CORONARY ANGIOGRAM N/A 06/23/2012   Procedure: LEFT HEART CATHETERIZATION WITH CORONARY ANGIOGRAM;  Surgeon: Sinclair Grooms, MD;  Location: [facility] CATH LAB;  Service: Cardiovascular;  Laterality: N/A;     Current Outpatient Medications  Medication Sig Dispense Refill  . aspirin EC 81 MG tablet Take 81 mg by mouth daily.    . budesonide-formoterol (SYMBICORT) 160-4.5 MCG/ACT inhaler Inhale 2 puffs into the lungs 2 (two) times daily.    . Cholecalciferol (VITAMIN D3 SUPER STRENGTH) 2000 units TABS Take 1 tablet by mouth daily.    . citalopram (CELEXA) 40 MG tablet Take 40 mg by mouth daily.  3  . clopidogrel (PLAVIX) 75 MG tablet Take 1 tablet (75 mg total) by mouth daily. Please keep upcoming appt for future refills. Thank you 90 tablet 0  . cyclobenzaprine (FLEXERIL) 10 MG tablet Take 10 mg by mouth  3 (three) times daily as needed for muscle spasms.    . diazepam (VALIUM) 5 MG tablet Take 5 mg by mouth every 8 (eight) hours as needed for anxiety.    . diclofenac sodium (VOLTAREN) 1 % GEL APPLY TO AFFECTED AREA(S) THREE TIMES DAILY  0  . furosemide (LASIX) 40 MG tablet Take 40 mg by mouth 2 (two) times daily.      Marland Kitchen gabapentin (NEURONTIN) 300 MG capsule Take 300 mg by mouth at bedtime.    . hydrOXYzine (ATARAX/VISTARIL) 25 MG tablet Take 1 tablet by mouth 2 (two) times daily.  5  . insulin lispro protamine-lispro (HUMALOG 75/25) (75-25) 100 UNIT/ML SUSP Inject 70-75 Units into the skin 2 (two) times daily with a meal. Takes 75 units in am and 70 units in pm     . levothyroxine (SYNTHROID, LEVOTHROID) 112 MCG tablet Take 224 mcg by mouth daily before breakfast.     . losartan (COZAAR) 25 MG tablet Take 1  tablet (25 mg total) by mouth daily. 30 tablet 3  . meclizine (ANTIVERT) 25 MG tablet Take 1 tablet by mouth 4 (four) times daily as needed.  5  . metFORMIN (GLUCOPHAGE) 500 MG tablet Take 500 mg by mouth 2 (two) times daily with a meal.     . metoprolol (TOPROL-XL) 25 MG 24 hr tablet Take 1 tablet (25 mg total) by mouth daily. 30 tablet 3  . mirtazapine (REMERON) 30 MG tablet Take 30 mg by mouth at bedtime.    . nitroGLYCERIN (NITROSTAT) 0.4 MG SL tablet PLACE 1 TABLET (0.4 MG TOTAL) UNDER THE TONGUE EVERY 5 (FIVE) MINUTES AS NEEDED FOR CHEST PAIN. 25 tablet 3  . ondansetron (ZOFRAN) 4 MG tablet Take 4 mg by mouth 4 (four) times daily.  3  . oxyCODONE (OXYCONTIN) 10 mg 12 hr tablet Take 1 tablet by mouth every 12 (twelve) hours.    Marland Kitchen oxyCODONE-acetaminophen (PERCOCET) 10-325 MG tablet Take 1 tablet by mouth every 4 (four) hours as needed for pain.    . pantoprazole (PROTONIX) 40 MG tablet TAKE 1 TABLET (40 MG TOTAL) BY MOUTH DAILY. PLEASE KEEP UPCOMING APPT FOR FUTURE REFILLS. THANK YOU 30 tablet 1  . potassium chloride SA (K-DUR,KLOR-CON) 20 MEQ tablet Take 40 mEq by mouth daily as needed (leg cramps).     . rosuvastatin (CRESTOR) 40 MG tablet Take 40 mg by mouth daily.      Marland Kitchen tiotropium (SPIRIVA) 18 MCG inhalation capsule Place 18 mcg into inhaler and inhale daily.    Marland Kitchen tobramycin-dexamethasone (TOBRADEX) ophthalmic solution Place 1 drop into both eyes daily.     Marland Kitchen HYDROcodone-acetaminophen (NORCO) 10-325 MG per tablet Take 1 tablet by mouth every 6 (six) hours as needed for pain.    . isosorbide mononitrate (IMDUR) 60 MG 24 hr tablet Take 1 tablet (60 mg total) by mouth daily. 90 tablet 3   Current Facility-Administered Medications  Medication Dose Route Frequency Provider Last Rate Last Dose  . nitroGLYCERIN (NITROSTAT) SL tablet 0.4 mg  0.4 mg Sublingual Q5 min PRN Isaiah Serge, NP   0.4 mg at 09/03/17 1608    Allergies:   Tape and Morphine    Social History:  The patient  reports  that she has never smoked. She has never used smokeless tobacco. She reports that she does not drink alcohol or use drugs.   Family History:  The patient's family history includes COPD in her mother; Heart attack in her father; Heart disease in her  father.    ROS:  General:no colds or fevers, no weight changes Skin:no rashes or ulcers HEENT:no blurred vision, no congestion CV:see HPI PUL:see HPI GI:no diarrhea constipation or melena, no indigestion GU:no hematuria, no dysuria MS:no joint pain, no claudication Neuro:no syncope, no lightheadedness Endo:no diabetes, + thyroid disease  Wt Readings from Last 3 Encounters:  09/03/17 203 lb 12.8 oz (92.4 kg)  06/12/17 195 lb 1.7 oz (88.5 kg)  06/24/16 204 lb (92.5 kg)     PHYSICAL EXAM: VS:  BP (!) 166/88   Pulse (!) 110   Ht 5\' 4"  (1.626 m)   Wt 203 lb 12.8 oz (92.4 kg)   SpO2 93%   BMI 34.98 kg/m  , BMI Body mass index is 34.98 kg/m. General:Pleasant affect, NAD Skin:Warm and dry, brisk capillary refill HEENT:normocephalic, sclera clear, mucus membranes moist Neck:supple, no JVD, no bruits  Heart:S1S2 RRR without murmur, gallup, rub or click Lungs:clear without rales, rhonchi, or wheezes HTD:SKAJ, non tender, + BS, do not palpate liver spleen or masses Ext:no lower ext edema, 2+ pedal pulses, 2+ radial pulses Neuro:alert and oriented X 3, MAE, follows commands, + facial symmetry    EKG:  EKG is ordered today. The ekg ordered today demonstrates SR with RBBB,  No acute changes.   Recent Labs: 06/10/2017: Magnesium 1.5; TSH 0.057 06/11/2017: ALT 27 06/12/2017: BUN 15; Creatinine, Ser 1.13; Hemoglobin 11.9; Platelets 112; Potassium 4.2; Sodium 138    Lipid Panel No results found for: CHOL, TRIG, HDL, CHOLHDL, VLDL, LDLCALC, LDLDIRECT     Other studies Reviewed: Additional studies/ records that were reviewed today include: . Cardiac cath  Ost LAD to Prox LAD overlapping Cypher DES stents (mid/overlap segment) are 80%  stenosed.  Scoring balloon angioplasty was performed using a BALLOON WOLVERINE 3.00X15 followed by post-dilation with a BALLOON SAPPHIRE [place] 3.5X15.  Post intervention, there is a 0% residual stenosis.  Mid LAD lesion is 50% stenosed. Distal LAD is diffusely diseased with up to 20% stenosis.  The left ventricular systolic function is normal. The left ventricular ejection fraction is greater than 65% by visual estimate.  LV end diastolic pressure is moderately elevated.   Severe ISR of overlapping Cypher DES in p-mLAD -- successful cutting balloon PTCA & post-dilation to 3.6 mm. Diffuse mid-distal LAD disease - no change. Otherwise no notable Ramus, Cx-OM or RCA disease   Echo complete 06/12/17  Study Conclusions  - Left ventricle: The cavity size was normal. There was mild focal   basal hypertrophy of the septum. Systolic function was normal.   The estimated ejection fraction was in the range of 60% to 65%.   Wall motion was normal; there were no regional wall motion   abnormalities. Doppler parameters are consistent with abnormal   left ventricular relaxation (grade 1 diastolic dysfunction).   There was no evidence of elevated ventricular filling pressure by   Doppler parameters. - Mitral valve: There was mild regurgitation. - Right ventricle: The cavity size was normal. Wall thickness was   normal. Systolic function was normal. - Tricuspid valve: There was mild regurgitation. - Pulmonary arteries: Systolic pressure was mildly increased. PA   peak pressure: 35 mm Hg (S). - Inferior vena cava: The vessel was normal in size. - Pericardium, extracardiac: There was no pericardial effusion.  ASSESSMENT AND PLAN:  1.   Unstable angina with increasing DOE and chest pain similar to pain with recent cutting balloon.for ISR to LAD.  NTG responsive pain.  Giving 3 --81 mg ASA  She took 1 baby asa this AM with Plavix.  Discussed with DOD Dr. Angelena Form --originally planned to admit but  much improved with NTG and ASA, she prefers to go home - we will add imdur 60 mg daily and arrange cath for Monday or Tuesday  --pt is happy with this plan,  Though she will call 911 if pain not relieved with SL NTG,  She has pull cords in her apartment that call 911.  Time spent 60 min with myself and Dr. Angelena Form  And stall.  2.   CAD with hx of LAD stents overlapping in 2005.  Recently with in-stent restenosis, having cutting balloon angioplasty. .  3.    HLD on statin continue  4.    HTN elevated currently   5.     Panic disorder.    6.    Hx of orthostatic hypotension.     Current medicines are reviewed with the patient today.  The patient Has no concerns regarding medicines.  The following changes have been made:  See above Labs/ tests ordered today include:see above  Disposition:   FU:  see above  Signed, Cecilie Kicks, NP  09/03/2017 5:06 PM    [address] [place] [place], [place] Phone: [phone]; Fax: [phone]

## 2017-09-03 NOTE — H&P (View-Only) (Signed)
Cardiology Office Note   Date:  09/03/2017   ID:  Sabrina, Fields 17-Oct-1955, MRN [id number]  PCP:  Sinda Du, MD  Cardiologist:  Dr. Tamala Julian    Chief Complaint  Patient presents with  . Chest Pain      History of Present Illness: [name] is a 62 y.o. female who presents for post hospitalization from 06/2017.    She has a hx of asthma, DM-2, GERD, HLD, panic attacks, sleep apnea, thyroid disease and   Hx of CAD with PCI to LAD in 2005   And most recent in 06/2017 she had admit wityh chest pain and near syncope and under went cardiac cath and had severe ISR of overlapping cyper DES in p-mLAD and underwent scoring balloon angioplasty and PTCA.  She also had RBBB on EKG that was new.   Also with orthostatic hypotension.     Today she has been having chest pain and DOE, the pain has awakened her from sleep. She does have nausea with it as well.  This has been on going for a few weeks.  She is currently having pain here in the office EKG without acute changes, + RBBB which has been present.  No palpitations, occ feels lightheaded.  BP is up some today.  Pt drove herself to office today.       Past Medical History:  Diagnosis Date  . Asthma   . Chest pain   . Coronary artery disease    a. LAD PCI in 2005 b. cath in 2014 showed patent pLAD stent c. Cath 06/11/17 severe ISR of overlapping Cypher DES in pLAD s/p cutting ballon PTCA only; mild dLAD dz  . Diabetes mellitus   . GERD (gastroesophageal reflux disease)   . Hypercholesteremia   . Hypertension   . Panic attack   . Sleep apnea   . Thyroid disease     Past Surgical History:  Procedure Laterality Date  . ABDOMINAL HYSTERECTOMY    . BACK SURGERY    . CHOLECYSTECTOMY    . CORONARY ANGIOPLASTY WITH STENT PLACEMENT    . CORONARY BALLOON ANGIOPLASTY N/A 06/11/2017   Procedure: CORONARY BALLOON ANGIOPLASTY;  Surgeon: Leonie Man, MD;  Location: [facility];  Service: Cardiovascular;  Laterality: N/A;   LAD  . ELBOW SURGERY    . KNEE SURGERY    . LEFT HEART CATH AND CORONARY ANGIOGRAPHY N/A 06/11/2017   Procedure: LEFT HEART CATH AND CORONARY ANGIOGRAPHY;  Surgeon: Leonie Man, MD;  Location: [facility];  Service: Cardiovascular;  Laterality: N/A;  . LEFT HEART CATHETERIZATION WITH CORONARY ANGIOGRAM N/A 06/23/2012   Procedure: LEFT HEART CATHETERIZATION WITH CORONARY ANGIOGRAM;  Surgeon: Sinclair Grooms, MD;  Location: [facility];  Service: Cardiovascular;  Laterality: N/A;     Current Outpatient Medications  Medication Sig Dispense Refill  . aspirin EC 81 MG tablet Take 81 mg by mouth daily.    . budesonide-formoterol (SYMBICORT) 160-4.5 MCG/ACT inhaler Inhale 2 puffs into the lungs 2 (two) times daily.    . Cholecalciferol (VITAMIN D3 SUPER STRENGTH) 2000 units TABS Take 1 tablet by mouth daily.    . citalopram (CELEXA) 40 MG tablet Take 40 mg by mouth daily.  3  . clopidogrel (PLAVIX) 75 MG tablet Take 1 tablet (75 mg total) by mouth daily. Please keep upcoming appt for future refills. Thank you 90 tablet 0  . cyclobenzaprine (FLEXERIL) 10 MG tablet Take 10 mg by mouth  3 (three) times daily as needed for muscle spasms.    . diazepam (VALIUM) 5 MG tablet Take 5 mg by mouth every 8 (eight) hours as needed for anxiety.    . diclofenac sodium (VOLTAREN) 1 % GEL APPLY TO AFFECTED AREA(S) THREE TIMES DAILY  0  . furosemide (LASIX) 40 MG tablet Take 40 mg by mouth 2 (two) times daily.      Marland Kitchen gabapentin (NEURONTIN) 300 MG capsule Take 300 mg by mouth at bedtime.    . hydrOXYzine (ATARAX/VISTARIL) 25 MG tablet Take 1 tablet by mouth 2 (two) times daily.  5  . insulin lispro protamine-lispro (HUMALOG 75/25) (75-25) 100 UNIT/ML SUSP Inject 70-75 Units into the skin 2 (two) times daily with a meal. Takes 75 units in am and 70 units in pm     . levothyroxine (SYNTHROID, LEVOTHROID) 112 MCG tablet Take 224 mcg by mouth daily before breakfast.     . losartan (COZAAR) 25 MG tablet Take 1  tablet (25 mg total) by mouth daily. 30 tablet 3  . meclizine (ANTIVERT) 25 MG tablet Take 1 tablet by mouth 4 (four) times daily as needed.  5  . metFORMIN (GLUCOPHAGE) 500 MG tablet Take 500 mg by mouth 2 (two) times daily with a meal.     . metoprolol (TOPROL-XL) 25 MG 24 hr tablet Take 1 tablet (25 mg total) by mouth daily. 30 tablet 3  . mirtazapine (REMERON) 30 MG tablet Take 30 mg by mouth at bedtime.    . nitroGLYCERIN (NITROSTAT) 0.4 MG SL tablet PLACE 1 TABLET (0.4 MG TOTAL) UNDER THE TONGUE EVERY 5 (FIVE) MINUTES AS NEEDED FOR CHEST PAIN. 25 tablet 3  . ondansetron (ZOFRAN) 4 MG tablet Take 4 mg by mouth 4 (four) times daily.  3  . oxyCODONE (OXYCONTIN) 10 mg 12 hr tablet Take 1 tablet by mouth every 12 (twelve) hours.    Marland Kitchen oxyCODONE-acetaminophen (PERCOCET) 10-325 MG tablet Take 1 tablet by mouth every 4 (four) hours as needed for pain.    . pantoprazole (PROTONIX) 40 MG tablet TAKE 1 TABLET (40 MG TOTAL) BY MOUTH DAILY. PLEASE KEEP UPCOMING APPT FOR FUTURE REFILLS. THANK YOU 30 tablet 1  . potassium chloride SA (K-DUR,KLOR-CON) 20 MEQ tablet Take 40 mEq by mouth daily as needed (leg cramps).     . rosuvastatin (CRESTOR) 40 MG tablet Take 40 mg by mouth daily.      Marland Kitchen tiotropium (SPIRIVA) 18 MCG inhalation capsule Place 18 mcg into inhaler and inhale daily.    Marland Kitchen tobramycin-dexamethasone (TOBRADEX) ophthalmic solution Place 1 drop into both eyes daily.     Marland Kitchen HYDROcodone-acetaminophen (NORCO) 10-325 MG per tablet Take 1 tablet by mouth every 6 (six) hours as needed for pain.    . isosorbide mononitrate (IMDUR) 60 MG 24 hr tablet Take 1 tablet (60 mg total) by mouth daily. 90 tablet 3   Current Facility-Administered Medications  Medication Dose Route Frequency Provider Last Rate Last Dose  . nitroGLYCERIN (NITROSTAT) SL tablet 0.4 mg  0.4 mg Sublingual Q5 min PRN Isaiah Serge, NP   0.4 mg at 09/03/17 1608    Allergies:   Tape and Morphine    Social History:  The patient  reports  that she has never smoked. She has never used smokeless tobacco. She reports that she does not drink alcohol or use drugs.   Family History:  The patient's family history includes COPD in her mother; Heart attack in her father; Heart disease in her  father.    ROS:  General:no colds or fevers, no weight changes Skin:no rashes or ulcers HEENT:no blurred vision, no congestion CV:see HPI PUL:see HPI GI:no diarrhea constipation or melena, no indigestion GU:no hematuria, no dysuria MS:no joint pain, no claudication Neuro:no syncope, no lightheadedness Endo:no diabetes, + thyroid disease  Wt Readings from Last 3 Encounters:  09/03/17 203 lb 12.8 oz (92.4 kg)  06/12/17 195 lb 1.7 oz (88.5 kg)  06/24/16 204 lb (92.5 kg)     PHYSICAL EXAM: VS:  BP (!) 166/88   Pulse (!) 110   Ht 5\' 4"  (1.626 m)   Wt 203 lb 12.8 oz (92.4 kg)   SpO2 93%   BMI 34.98 kg/m  , BMI Body mass index is 34.98 kg/m. General:Pleasant affect, NAD Skin:Warm and dry, brisk capillary refill HEENT:normocephalic, sclera clear, mucus membranes moist Neck:supple, no JVD, no bruits  Heart:S1S2 RRR without murmur, gallup, rub or click Lungs:clear without rales, rhonchi, or wheezes EHU:DJSH, non tender, + BS, do not palpate liver spleen or masses Ext:no lower ext edema, 2+ pedal pulses, 2+ radial pulses Neuro:alert and oriented X 3, MAE, follows commands, + facial symmetry    EKG:  EKG is ordered today. The ekg ordered today demonstrates SR with RBBB,  No acute changes.   Recent Labs: 06/10/2017: Magnesium 1.5; TSH 0.057 06/11/2017: ALT 27 06/12/2017: BUN 15; Creatinine, Ser 1.13; Hemoglobin 11.9; Platelets 112; Potassium 4.2; Sodium 138    Lipid Panel No results found for: CHOL, TRIG, HDL, CHOLHDL, VLDL, LDLCALC, LDLDIRECT     Other studies Reviewed: Additional studies/ records that were reviewed today include: . Cardiac cath  Ost LAD to Prox LAD overlapping Cypher DES stents (mid/overlap segment) are 80%  stenosed.  Scoring balloon angioplasty was performed using a BALLOON [place] 3.00X15 followed by post-dilation with a BALLOON SAPPHIRE [place] 3.5X15.  Post intervention, there is a 0% residual stenosis.  Mid LAD lesion is 50% stenosed. Distal LAD is diffusely diseased with up to 20% stenosis.  The left ventricular systolic function is normal. The left ventricular ejection fraction is greater than 65% by visual estimate.  LV end diastolic pressure is moderately elevated.   Severe ISR of overlapping Cypher DES in p-mLAD -- successful cutting balloon PTCA & post-dilation to 3.6 mm. Diffuse mid-distal LAD disease - no change. Otherwise no notable Ramus, Cx-OM or RCA disease   Echo complete 06/12/17  Study Conclusions  - Left ventricle: The cavity size was normal. There was mild focal   basal hypertrophy of the septum. Systolic function was normal.   The estimated ejection fraction was in the range of 60% to 65%.   Wall motion was normal; there were no regional wall motion   abnormalities. Doppler parameters are consistent with abnormal   left ventricular relaxation (grade 1 diastolic dysfunction).   There was no evidence of elevated ventricular filling pressure by   Doppler parameters. - Mitral valve: There was mild regurgitation. - Right ventricle: The cavity size was normal. Wall thickness was   normal. Systolic function was normal. - Tricuspid valve: There was mild regurgitation. - Pulmonary arteries: Systolic pressure was mildly increased. PA   peak pressure: 35 mm Hg (S). - Inferior vena cava: The vessel was normal in size. - Pericardium, extracardiac: There was no pericardial effusion.  ASSESSMENT AND PLAN:  1.   Unstable angina with increasing DOE and chest pain similar to pain with recent cutting balloon.for ISR to LAD.  NTG responsive pain.  Giving 3 --81 mg ASA  She took 1 baby asa this AM with Plavix.  Discussed with DOD Dr. Angelena Form --originally planned to admit but  much improved with NTG and ASA, she prefers to go home - we will add imdur 60 mg daily and arrange cath for Monday or Tuesday  --pt is happy with this plan,  Though she will call 911 if pain not relieved with SL NTG,  She has pull cords in her apartment that call 911.  Time spent 60 min with myself and Dr. Angelena Form  And stall.  2.   CAD with hx of LAD stents overlapping in 2005.  Recently with in-stent restenosis, having cutting balloon angioplasty. .  3.    HLD on statin continue  4.    HTN elevated currently   5.     Panic disorder.    6.    Hx of orthostatic hypotension.     Current medicines are reviewed with the patient today.  The patient Has no concerns regarding medicines.  The following changes have been made:  See above Labs/ tests ordered today include:see above  Disposition:   FU:  see above  Signed, Cecilie Kicks, NP  09/03/2017 5:06 PM    [address], [place], [place], [place] Phone: [phone]; Fax: [phone]

## 2017-09-04 LAB — BASIC METABOLIC PANEL
BUN/Creatinine Ratio: 21 (ref 12–28)
BUN: 24 mg/dL (ref 8–27)
CO2: 24 mmol/L (ref 20–29)
Calcium: 9.4 mg/dL (ref 8.7–10.3)
Chloride: 95 mmol/L — ABNORMAL LOW (ref 96–106)
Creatinine, Ser: 1.13 mg/dL — ABNORMAL HIGH (ref 0.57–1.00)
GFR calc Af Amer: 60 mL/min/{1.73_m2} (ref >59)
GFR calc non Af Amer: 52 mL/min/{1.73_m2} — ABNORMAL LOW (ref >59)
Glucose: 328 mg/dL — ABNORMAL HIGH (ref 65–99)
Potassium: 4 mmol/L (ref 3.5–5.2)
Sodium: 145 mmol/L — ABNORMAL HIGH (ref 134–144)

## 2017-09-04 LAB — CBC
Hematocrit: 39.2 % (ref 34.0–46.6)
Hemoglobin: 12.6 g/dL (ref 11.1–15.9)
MCH: 29 pg (ref 26.6–33.0)
MCHC: 32.1 g/dL (ref 31.5–35.7)
MCV: 90 fL (ref 79–97)
Platelets: 160 10*3/uL (ref 150–450)
RBC: 4.35 x10E6/uL (ref 3.77–5.28)
RDW: 15 % (ref 12.3–15.4)
WBC: 5.4 10*3/uL (ref 3.4–10.8)

## 2017-09-06 ENCOUNTER — Telehealth: Payer: Self-pay | Admitting: *Deleted

## 2017-09-06 NOTE — Telephone Encounter (Signed)
Pt contacted pre-catheterization scheduled at [facility] for: Tuesday September 07, 2017 7:30 AM Verified arrival time and place: [place] Entrance A at: 5:30 AM  No solid food after midnight prior to cath, clear liquids until 5 AM day of procedure. Verify allergies in Epic  Hold: Furosemide AM of procedure. KCl AM of procedure. Insulin AM of procedure. Metformin AM of procedure and 48 hours post procedure Losartan AM of procedure  Except hold medications AM meds can be  taken pre-cath with sip of water including: ASA 81 mg Clopidogrel 75 mg  Confirm patient has responsible person to drive home post procedure and for 24 hours after you arrive home.   LMTCB to discuss with patient.

## 2017-09-07 ENCOUNTER — Encounter (HOSPITAL_COMMUNITY)
Admission: RE | Disposition: A | Discharge status: Home / Self Care | Payer: Self-pay | Source: Ambulatory Visit | Attending: Interventional Cardiology

## 2017-09-07 ENCOUNTER — Encounter (HOSPITAL_COMMUNITY): Payer: Self-pay | Admitting: General Practice

## 2017-09-07 ENCOUNTER — Ambulatory Visit (HOSPITAL_COMMUNITY)
Admission: RE | Admit: 2017-09-07 | Discharge: 2017-09-09 | Disposition: A | Discharge status: Home / Self Care | Payer: Medicare Other | Source: Ambulatory Visit | Attending: Interventional Cardiology | Admitting: Interventional Cardiology

## 2017-09-07 ENCOUNTER — Other Ambulatory Visit: Payer: Self-pay

## 2017-09-07 DIAGNOSIS — Z7982 Long term (current) use of aspirin: Secondary | ICD-10-CM | POA: Diagnosis not present

## 2017-09-07 DIAGNOSIS — Z794 Long term (current) use of insulin: Secondary | ICD-10-CM | POA: Diagnosis not present

## 2017-09-07 DIAGNOSIS — Z955 Presence of coronary angioplasty implant and graft: Secondary | ICD-10-CM | POA: Diagnosis not present

## 2017-09-07 DIAGNOSIS — G4730 Sleep apnea, unspecified: Secondary | ICD-10-CM | POA: Diagnosis not present

## 2017-09-07 DIAGNOSIS — Z9049 Acquired absence of other specified parts of digestive tract: Secondary | ICD-10-CM | POA: Insufficient documentation

## 2017-09-07 DIAGNOSIS — I4510 Unspecified right bundle-branch block: Secondary | ICD-10-CM | POA: Diagnosis not present

## 2017-09-07 DIAGNOSIS — Z79899 Other long term (current) drug therapy: Secondary | ICD-10-CM | POA: Insufficient documentation

## 2017-09-07 DIAGNOSIS — E119 Type 2 diabetes mellitus without complications: Secondary | ICD-10-CM | POA: Insufficient documentation

## 2017-09-07 DIAGNOSIS — J45909 Unspecified asthma, uncomplicated: Secondary | ICD-10-CM | POA: Insufficient documentation

## 2017-09-07 DIAGNOSIS — Z8249 Family history of ischemic heart disease and other diseases of the circulatory system: Secondary | ICD-10-CM | POA: Insufficient documentation

## 2017-09-07 DIAGNOSIS — E079 Disorder of thyroid, unspecified: Secondary | ICD-10-CM | POA: Diagnosis not present

## 2017-09-07 DIAGNOSIS — K219 Gastro-esophageal reflux disease without esophagitis: Secondary | ICD-10-CM | POA: Insufficient documentation

## 2017-09-07 DIAGNOSIS — E785 Hyperlipidemia, unspecified: Secondary | ICD-10-CM | POA: Insufficient documentation

## 2017-09-07 DIAGNOSIS — I209 Angina pectoris, unspecified: Secondary | ICD-10-CM | POA: Diagnosis present

## 2017-09-07 DIAGNOSIS — I25119 Atherosclerotic heart disease of native coronary artery with unspecified angina pectoris: Secondary | ICD-10-CM | POA: Diagnosis not present

## 2017-09-07 DIAGNOSIS — Z888 Allergy status to other drugs, medicaments and biological substances status: Secondary | ICD-10-CM | POA: Insufficient documentation

## 2017-09-07 DIAGNOSIS — Z79890 Hormone replacement therapy (postmenopausal): Secondary | ICD-10-CM | POA: Diagnosis not present

## 2017-09-07 DIAGNOSIS — Z836 Family history of other diseases of the respiratory system: Secondary | ICD-10-CM | POA: Insufficient documentation

## 2017-09-07 DIAGNOSIS — F410 Panic disorder [episodic paroxysmal anxiety] without agoraphobia: Secondary | ICD-10-CM | POA: Insufficient documentation

## 2017-09-07 DIAGNOSIS — I10 Essential (primary) hypertension: Secondary | ICD-10-CM | POA: Diagnosis present

## 2017-09-07 DIAGNOSIS — Z885 Allergy status to narcotic agent status: Secondary | ICD-10-CM | POA: Diagnosis not present

## 2017-09-07 DIAGNOSIS — Z7901 Long term (current) use of anticoagulants: Secondary | ICD-10-CM | POA: Insufficient documentation

## 2017-09-07 DIAGNOSIS — I25110 Atherosclerotic heart disease of native coronary artery with unstable angina pectoris: Secondary | ICD-10-CM | POA: Diagnosis present

## 2017-09-07 DIAGNOSIS — I951 Orthostatic hypotension: Secondary | ICD-10-CM | POA: Insufficient documentation

## 2017-09-07 HISTORY — PX: CORONARY STENT INTERVENTION: CATH118234

## 2017-09-07 HISTORY — DX: Personal history of other malignant neoplasm of skin: Z85.828

## 2017-09-07 HISTORY — DX: Acute myocardial infarction, unspecified: I21.9

## 2017-09-07 HISTORY — DX: Unspecified osteoarthritis, unspecified site: M19.90

## 2017-09-07 HISTORY — DX: Type 2 diabetes mellitus with diabetic neuropathy, unspecified: E11.40

## 2017-09-07 HISTORY — PX: LEFT HEART CATH AND CORONARY ANGIOGRAPHY: CATH118249

## 2017-09-07 LAB — GLUCOSE, CAPILLARY
Glucose-Capillary: 203 mg/dL — ABNORMAL HIGH (ref 70–99)
Glucose-Capillary: 193 mg/dL — ABNORMAL HIGH (ref 70–99)
Glucose-Capillary: 187 mg/dL — ABNORMAL HIGH (ref 70–99)
Glucose-Capillary: 290 mg/dL — ABNORMAL HIGH (ref 70–99)
Glucose-Capillary: 208 mg/dL — ABNORMAL HIGH (ref 70–99)

## 2017-09-07 LAB — POCT ACTIVATED CLOTTING TIME: Activated Clotting Time: 373 seconds

## 2017-09-07 SURGERY — LEFT HEART CATH AND CORONARY ANGIOGRAPHY
Anesthesia: LOCAL

## 2017-09-07 MED ORDER — NITROGLYCERIN 1 MG/10 ML FOR IR/CATH LAB
INTRA_ARTERIAL | Status: AC
  Filled 2017-09-07: qty 10

## 2017-09-07 MED ORDER — DIAZEPAM 5 MG PO TABS: 5.0000 mg | ORAL_TABLET | Freq: Three times a day (TID) | ORAL | Status: DC | PRN

## 2017-09-07 MED ORDER — CYCLOBENZAPRINE HCL 10 MG PO TABS: 10.0000 mg | ORAL_TABLET | Freq: Three times a day (TID) | ORAL | Status: DC | PRN

## 2017-09-07 MED ORDER — ROSUVASTATIN CALCIUM 20 MG PO TABS
40.0000 mg | ORAL_TABLET | Freq: Every day | ORAL | Status: DC
  Administered 2017-09-07 – 2017-09-09 (×3): 40 mg via ORAL
  Filled 2017-09-07 (×3): qty 2

## 2017-09-07 MED ORDER — METOPROLOL SUCCINATE ER 25 MG PO TB24
25.0000 mg | ORAL_TABLET | Freq: Every day | ORAL | Status: DC
  Administered 2017-09-07 – 2017-09-09 (×3): 25 mg via ORAL
  Filled 2017-09-07 (×4): qty 1

## 2017-09-07 MED ORDER — ANGIOPLASTY BOOK
Freq: Once | Status: AC
  Administered 2017-09-07: 1
  Filled 2017-09-07: qty 1

## 2017-09-07 MED ORDER — LOSARTAN POTASSIUM 50 MG PO TABS
25.0000 mg | ORAL_TABLET | Freq: Every day | ORAL | Status: DC
  Administered 2017-09-07 – 2017-09-09 (×3): 25 mg via ORAL
  Filled 2017-09-07 (×3): qty 1

## 2017-09-07 MED ORDER — SODIUM CHLORIDE 0.9 % IV SOLN: 250.0000 mL | INTRAVENOUS | Status: DC | PRN

## 2017-09-07 MED ORDER — IOHEXOL 350 MG/ML SOLN
INTRAVENOUS | Status: DC | PRN
  Administered 2017-09-07: 90 mL via INTRA_ARTERIAL

## 2017-09-07 MED ORDER — FENTANYL CITRATE (PF) 100 MCG/2ML IJ SOLN
25.0000 ug | Freq: Once | INTRAMUSCULAR | Status: AC
  Administered 2017-09-07: 13:00:00 50 ug via INTRAVENOUS
  Filled 2017-09-07: qty 2

## 2017-09-07 MED ORDER — MIDAZOLAM HCL 2 MG/2ML IJ SOLN
INTRAMUSCULAR | Status: AC
  Filled 2017-09-07: qty 2

## 2017-09-07 MED ORDER — BIVALIRUDIN TRIFLUOROACETATE 250 MG IV SOLR
INTRAVENOUS | Status: AC
  Filled 2017-09-07: qty 250

## 2017-09-07 MED ORDER — CITALOPRAM HYDROBROMIDE 20 MG PO TABS
40.0000 mg | ORAL_TABLET | Freq: Every day | ORAL | Status: DC
  Administered 2017-09-07 – 2017-09-08 (×2): 40 mg via ORAL
  Filled 2017-09-07 (×2): qty 2

## 2017-09-07 MED ORDER — SODIUM CHLORIDE 0.9 % WEIGHT BASED INFUSION
3.0000 mL/kg/h | INTRAVENOUS | Status: DC
  Administered 2017-09-07: 3 mL/kg/h via INTRAVENOUS

## 2017-09-07 MED ORDER — ASPIRIN 81 MG PO CHEW: 81.0000 mg | CHEWABLE_TABLET | Freq: Every day | ORAL | Status: DC

## 2017-09-07 MED ORDER — LABETALOL HCL 5 MG/ML IV SOLN
10.0000 mg | INTRAVENOUS | Status: AC | PRN
  Administered 2017-09-07: 11:00:00 10 mg via INTRAVENOUS
  Filled 2017-09-07: qty 4

## 2017-09-07 MED ORDER — PANTOPRAZOLE SODIUM 40 MG PO TBEC
40.0000 mg | DELAYED_RELEASE_TABLET | Freq: Every day | ORAL | Status: DC
  Administered 2017-09-07 – 2017-09-09 (×3): 40 mg via ORAL
  Filled 2017-09-07 (×3): qty 1

## 2017-09-07 MED ORDER — SODIUM CHLORIDE 0.9 % WEIGHT BASED INFUSION: 1.0000 mL/kg/h | INTRAVENOUS | Status: DC

## 2017-09-07 MED ORDER — OXYCODONE HCL ER 10 MG PO T12A: 10.0000 mg | EXTENDED_RELEASE_TABLET | Freq: Two times a day (BID) | ORAL | Status: DC

## 2017-09-07 MED ORDER — POTASSIUM CHLORIDE CRYS ER 20 MEQ PO TBCR: 40.0000 meq | EXTENDED_RELEASE_TABLET | Freq: Every day | ORAL | Status: DC | PRN

## 2017-09-07 MED ORDER — MOMETASONE FURO-FORMOTEROL FUM 200-5 MCG/ACT IN AERO
2.0000 | INHALATION_SPRAY | Freq: Two times a day (BID) | RESPIRATORY_TRACT | Status: DC
  Administered 2017-09-08 – 2017-09-09 (×2): 2 via RESPIRATORY_TRACT
  Filled 2017-09-07: qty 8.8

## 2017-09-07 MED ORDER — TOBRAMYCIN-DEXAMETHASONE 0.3-0.1 % OP SUSP
1.0000 [drp] | Freq: Every day | OPHTHALMIC | Status: DC
  Administered 2017-09-07 – 2017-09-09 (×2): 1 [drp] via OPHTHALMIC
  Filled 2017-09-07: qty 2.5

## 2017-09-07 MED ORDER — HEPARIN (PORCINE) IN NACL 1000-0.9 UT/500ML-% IV SOLN
INTRAVENOUS | Status: AC
  Filled 2017-09-07: qty 1000

## 2017-09-07 MED ORDER — BIVALIRUDIN BOLUS VIA INFUSION - CUPID
INTRAVENOUS | Status: DC | PRN
  Administered 2017-09-07: 68.025 mg via INTRAVENOUS

## 2017-09-07 MED ORDER — CLOPIDOGREL BISULFATE 300 MG PO TABS
ORAL_TABLET | ORAL | Status: DC | PRN
  Administered 2017-09-07: 300 mg via ORAL

## 2017-09-07 MED ORDER — CLOPIDOGREL BISULFATE 75 MG PO TABS
75.0000 mg | ORAL_TABLET | Freq: Every day | ORAL | Status: DC
  Administered 2017-09-08: 75 mg via ORAL
  Filled 2017-09-07: qty 1

## 2017-09-07 MED ORDER — ASPIRIN EC 81 MG PO TBEC
81.0000 mg | DELAYED_RELEASE_TABLET | Freq: Every day | ORAL | Status: DC
  Administered 2017-09-08 – 2017-09-09 (×2): 81 mg via ORAL
  Filled 2017-09-07 (×2): qty 1

## 2017-09-07 MED ORDER — SODIUM CHLORIDE 0.9% FLUSH: 3.0000 mL | INTRAVENOUS | Status: DC | PRN

## 2017-09-07 MED ORDER — LEVOTHYROXINE SODIUM 112 MCG PO TABS
224.0000 ug | ORAL_TABLET | Freq: Every day | ORAL | Status: DC
  Administered 2017-09-08 – 2017-09-09 (×2): 224 ug via ORAL
  Filled 2017-09-07 (×2): qty 2

## 2017-09-07 MED ORDER — HYDROXYZINE HCL 25 MG PO TABS
25.0000 mg | ORAL_TABLET | Freq: Two times a day (BID) | ORAL | Status: DC
  Administered 2017-09-07 – 2017-09-09 (×5): 25 mg via ORAL
  Filled 2017-09-07 (×6): qty 1

## 2017-09-07 MED ORDER — GABAPENTIN 300 MG PO CAPS
300.0000 mg | ORAL_CAPSULE | Freq: Every day | ORAL | Status: DC
  Administered 2017-09-07 – 2017-09-08 (×2): 300 mg via ORAL
  Filled 2017-09-07 (×2): qty 1

## 2017-09-07 MED ORDER — MIDAZOLAM HCL 2 MG/2ML IJ SOLN
INTRAMUSCULAR | Status: DC | PRN
  Administered 2017-09-07: 2 mg via INTRAVENOUS
  Administered 2017-09-07 (×3): 1 mg via INTRAVENOUS

## 2017-09-07 MED ORDER — MIDAZOLAM HCL 2 MG/2ML IJ SOLN
INTRAMUSCULAR | Status: AC
Start: 2017-09-07 — End: ?
  Filled 2017-09-07: qty 2

## 2017-09-07 MED ORDER — LIDOCAINE HCL (PF) 1 % IJ SOLN
INTRAMUSCULAR | Status: AC
  Filled 2017-09-07: qty 30

## 2017-09-07 MED ORDER — CLOPIDOGREL BISULFATE 300 MG PO TABS
ORAL_TABLET | ORAL | Status: AC
  Filled 2017-09-07: qty 1

## 2017-09-07 MED ORDER — SODIUM CHLORIDE 0.9% FLUSH: 3.0000 mL | Freq: Two times a day (BID) | INTRAVENOUS | Status: DC

## 2017-09-07 MED ORDER — NITROGLYCERIN 0.4 MG SL SUBL
0.4000 mg | SUBLINGUAL_TABLET | SUBLINGUAL | Status: DC | PRN
  Administered 2017-09-08 (×5): 0.4 mg via SUBLINGUAL
  Filled 2017-09-07 (×2): qty 1

## 2017-09-07 MED ORDER — FENTANYL CITRATE (PF) 100 MCG/2ML IJ SOLN
INTRAMUSCULAR | Status: AC
  Filled 2017-09-07: qty 2

## 2017-09-07 MED ORDER — HEPARIN (PORCINE) IN NACL 1000-0.9 UT/500ML-% IV SOLN
INTRAVENOUS | Status: DC | PRN
  Administered 2017-09-07 (×2): 500 mL

## 2017-09-07 MED ORDER — CLOPIDOGREL BISULFATE 75 MG PO TABS: 75.0000 mg | ORAL_TABLET | Freq: Every day | ORAL | Status: DC

## 2017-09-07 MED ORDER — TIOTROPIUM BROMIDE MONOHYDRATE 18 MCG IN CAPS
18.0000 ug | ORAL_CAPSULE | Freq: Every day | RESPIRATORY_TRACT | Status: DC
  Administered 2017-09-08 – 2017-09-09 (×2): 18 ug via RESPIRATORY_TRACT
  Filled 2017-09-07: qty 5

## 2017-09-07 MED ORDER — NITROGLYCERIN 0.4 MG SL SUBL
0.4000 mg | SUBLINGUAL_TABLET | SUBLINGUAL | Status: DC | PRN
  Administered 2017-09-07: 0.4 mg via SUBLINGUAL

## 2017-09-07 MED ORDER — FENTANYL CITRATE (PF) 100 MCG/2ML IJ SOLN
INTRAMUSCULAR | Status: DC | PRN
  Administered 2017-09-07 (×4): 25 ug via INTRAVENOUS

## 2017-09-07 MED ORDER — HYDROCODONE-ACETAMINOPHEN 10-325 MG PO TABS
1.0000 | ORAL_TABLET | Freq: Four times a day (QID) | ORAL | Status: DC | PRN
  Administered 2017-09-07 – 2017-09-08 (×3): 1 via ORAL
  Filled 2017-09-07 (×3): qty 1

## 2017-09-07 MED ORDER — SODIUM CHLORIDE 0.9 % IV SOLN
INTRAVENOUS | Status: AC | PRN
  Administered 2017-09-07: 1.75 mg/kg/h via INTRAVENOUS

## 2017-09-07 MED ORDER — MIRTAZAPINE 30 MG PO TABS
30.0000 mg | ORAL_TABLET | Freq: Every day | ORAL | Status: DC
  Administered 2017-09-07 – 2017-09-08 (×2): 30 mg via ORAL
  Filled 2017-09-07 (×3): qty 1

## 2017-09-07 MED ORDER — LIDOCAINE HCL (PF) 1 % IJ SOLN
INTRAMUSCULAR | Status: DC | PRN
  Administered 2017-09-07: 18 mL

## 2017-09-07 MED ORDER — NITROGLYCERIN 0.4 MG SL SUBL
SUBLINGUAL_TABLET | SUBLINGUAL | Status: AC
  Filled 2017-09-07: qty 3

## 2017-09-07 MED ORDER — SODIUM CHLORIDE 0.9% FLUSH
3.0000 mL | Freq: Two times a day (BID) | INTRAVENOUS | Status: DC
  Administered 2017-09-07 – 2017-09-08 (×2): 3 mL via INTRAVENOUS

## 2017-09-07 MED ORDER — HYDRALAZINE HCL 20 MG/ML IJ SOLN
5.0000 mg | INTRAMUSCULAR | Status: AC | PRN
  Filled 2017-09-07: qty 1

## 2017-09-07 MED ORDER — INSULIN ASPART PROT & ASPART (70-30 MIX) 100 UNIT/ML ~~LOC~~ SUSP
70.0000 [IU] | Freq: Two times a day (BID) | SUBCUTANEOUS | Status: DC
  Administered 2017-09-07 – 2017-09-08 (×3): 70 [IU] via SUBCUTANEOUS
  Filled 2017-09-07: qty 10

## 2017-09-07 MED ORDER — SODIUM CHLORIDE 0.9 % IV SOLN
INTRAVENOUS | Status: AC
  Administered 2017-09-07: 10:00:00 via INTRAVENOUS

## 2017-09-07 MED ORDER — ASPIRIN 81 MG PO CHEW
81.0000 mg | CHEWABLE_TABLET | ORAL | Status: AC
  Administered 2017-09-07: 81 mg via ORAL
  Filled 2017-09-07: qty 1

## 2017-09-07 MED ORDER — NITROGLYCERIN 1 MG/10 ML FOR IR/CATH LAB
INTRA_ARTERIAL | Status: DC | PRN
  Administered 2017-09-07: 200 ug via INTRACORONARY

## 2017-09-07 MED ORDER — ONDANSETRON HCL 4 MG/2ML IJ SOLN: 4.0000 mg | Freq: Four times a day (QID) | INTRAMUSCULAR | Status: DC | PRN

## 2017-09-07 MED ORDER — ACETAMINOPHEN 325 MG PO TABS: 650.0000 mg | ORAL_TABLET | ORAL | Status: DC | PRN

## 2017-09-07 SURGICAL SUPPLY — 18 items
BALLN SAPPHIRE 2.5X15 (BALLOONS) ×2
BALLN ~~LOC~~ EMERGE MR 3.75X15 (BALLOONS) ×2
BALLOON SAPPHIRE 2.5X15 (BALLOONS) IMPLANT
BALLOON ~~LOC~~ EMERGE MR 3.75X15 (BALLOONS) IMPLANT
CATH INFINITI 5FR MULTPACK ANG (CATHETERS) ×1 IMPLANT
CATH LAUNCHER 6FR EBU3.5 (CATHETERS) ×1 IMPLANT
KIT ENCORE 26 ADVANTAGE (KITS) ×1 IMPLANT
KIT HEART LEFT (KITS) ×2 IMPLANT
KIT HEMO VALVE WATCHDOG (MISCELLANEOUS) ×1 IMPLANT
PACK CARDIAC CATHETERIZATION (CUSTOM PROCEDURE TRAY) ×2 IMPLANT
SHEATH PINNACLE 5F 10CM (SHEATH) ×1 IMPLANT
SHEATH PINNACLE 6F 10CM (SHEATH) ×1 IMPLANT
SHEATH PROBE COVER 6X72 (BAG) ×1 IMPLANT
STENT SYNERGY DES 3X24 (Permanent Stent) ×1 IMPLANT
TRANSDUCER W/STOPCOCK (MISCELLANEOUS) ×2 IMPLANT
TUBING CIL FLEX 10 FLL-RA (TUBING) ×2 IMPLANT
WIRE ASAHI PROWATER 180CM (WIRE) ×1 IMPLANT
WIRE EMERALD 3MM-J .035X150CM (WIRE) ×1 IMPLANT

## 2017-09-07 NOTE — Telephone Encounter (Signed)
Catheterization was done 09/07/17

## 2017-09-07 NOTE — Interval H&P Note (Signed)
Cath Lab Visit (complete for each Cath Lab visit)  Clinical Evaluation Leading to the Procedure:   ACS: No.  Non-ACS:    Anginal Classification: CCS III  Anti-ischemic medical therapy: Minimal Therapy (1 class of medications)  Non-Invasive Test Results: No non-invasive testing performed  Prior CABG: No previous CABG      History and Physical Interval Note:  09/07/2017 7:34 AM  [name]  has presented today for surgery, with the diagnosis of [place]  The various methods of treatment have been discussed with the patient and family. After consideration of risks, benefits and other options for treatment, the patient has consented to  Procedure(s): LEFT HEART CATH AND CORONARY ANGIOGRAPHY (N/A) as a surgical intervention .  The patient's history has been reviewed, patient examined, no change in status, stable for surgery.  I have reviewed the patient's chart and labs.  Questions were answered to the patient's satisfaction.     Larae Grooms

## 2017-09-07 NOTE — Progress Notes (Addendum)
Pt states to Nechama Guard, RN she is having chest pain at a level 4 and would take a nitro if at home. Emergency orders initiated and one nitro given. After 5 minutes pt states pain is easing off a lot and it is now down to a 3 and does not want any more nitroglycerin.

## 2017-09-07 NOTE — Progress Notes (Signed)
Site area: right groin  Site Prior to Removal:  Level 0  Pressure Applied For 20 MINUTES    Minutes Beginning at 1305  Manual:   Yes.    Patient Status During Pull:  stable  Post Pull Groin Site:  Level 1  Post Pull Instructions Given:  Yes.    Post Pull Pulses Present:  Yes.    Dressing Applied:  Yes.    Comments:

## 2017-09-08 ENCOUNTER — Other Ambulatory Visit: Payer: Self-pay | Admitting: Physician Assistant

## 2017-09-08 DIAGNOSIS — Z836 Family history of other diseases of the respiratory system: Secondary | ICD-10-CM | POA: Diagnosis not present

## 2017-09-08 DIAGNOSIS — Z955 Presence of coronary angioplasty implant and graft: Secondary | ICD-10-CM | POA: Diagnosis not present

## 2017-09-08 DIAGNOSIS — I10 Essential (primary) hypertension: Secondary | ICD-10-CM | POA: Diagnosis not present

## 2017-09-08 DIAGNOSIS — Z7901 Long term (current) use of anticoagulants: Secondary | ICD-10-CM | POA: Diagnosis not present

## 2017-09-08 DIAGNOSIS — J45909 Unspecified asthma, uncomplicated: Secondary | ICD-10-CM | POA: Diagnosis not present

## 2017-09-08 DIAGNOSIS — Z7982 Long term (current) use of aspirin: Secondary | ICD-10-CM | POA: Diagnosis not present

## 2017-09-08 DIAGNOSIS — I25110 Atherosclerotic heart disease of native coronary artery with unstable angina pectoris: Secondary | ICD-10-CM | POA: Diagnosis not present

## 2017-09-08 DIAGNOSIS — I951 Orthostatic hypotension: Secondary | ICD-10-CM | POA: Diagnosis not present

## 2017-09-08 DIAGNOSIS — I25119 Atherosclerotic heart disease of native coronary artery with unspecified angina pectoris: Secondary | ICD-10-CM | POA: Diagnosis not present

## 2017-09-08 DIAGNOSIS — I4510 Unspecified right bundle-branch block: Secondary | ICD-10-CM | POA: Diagnosis not present

## 2017-09-08 DIAGNOSIS — Z885 Allergy status to narcotic agent status: Secondary | ICD-10-CM | POA: Diagnosis not present

## 2017-09-08 DIAGNOSIS — Z9049 Acquired absence of other specified parts of digestive tract: Secondary | ICD-10-CM | POA: Diagnosis not present

## 2017-09-08 DIAGNOSIS — E7849 Other hyperlipidemia: Secondary | ICD-10-CM | POA: Diagnosis not present

## 2017-09-08 DIAGNOSIS — E079 Disorder of thyroid, unspecified: Secondary | ICD-10-CM | POA: Diagnosis not present

## 2017-09-08 DIAGNOSIS — I209 Angina pectoris, unspecified: Secondary | ICD-10-CM

## 2017-09-08 DIAGNOSIS — Z888 Allergy status to other drugs, medicaments and biological substances status: Secondary | ICD-10-CM | POA: Diagnosis not present

## 2017-09-08 DIAGNOSIS — Z79890 Hormone replacement therapy (postmenopausal): Secondary | ICD-10-CM | POA: Diagnosis not present

## 2017-09-08 DIAGNOSIS — Z79899 Other long term (current) drug therapy: Secondary | ICD-10-CM | POA: Diagnosis not present

## 2017-09-08 DIAGNOSIS — G4730 Sleep apnea, unspecified: Secondary | ICD-10-CM | POA: Diagnosis not present

## 2017-09-08 DIAGNOSIS — K219 Gastro-esophageal reflux disease without esophagitis: Secondary | ICD-10-CM | POA: Diagnosis not present

## 2017-09-08 DIAGNOSIS — Z8249 Family history of ischemic heart disease and other diseases of the circulatory system: Secondary | ICD-10-CM | POA: Diagnosis not present

## 2017-09-08 DIAGNOSIS — E119 Type 2 diabetes mellitus without complications: Secondary | ICD-10-CM | POA: Diagnosis not present

## 2017-09-08 DIAGNOSIS — Z794 Long term (current) use of insulin: Secondary | ICD-10-CM | POA: Diagnosis not present

## 2017-09-08 DIAGNOSIS — E785 Hyperlipidemia, unspecified: Secondary | ICD-10-CM | POA: Diagnosis not present

## 2017-09-08 LAB — CBC
HCT: 36.8 % (ref 36.0–46.0)
Hemoglobin: 11.9 g/dL — ABNORMAL LOW (ref 12.0–15.0)
MCH: 29.3 pg (ref 26.0–34.0)
MCHC: 32.3 g/dL (ref 30.0–36.0)
MCV: 90.6 fL (ref 78.0–100.0)
Platelets: 142 10*3/uL — ABNORMAL LOW (ref 150–400)
RBC: 4.06 MIL/uL (ref 3.87–5.11)
RDW: 13.6 % (ref 11.5–15.5)
WBC: 5.8 10*3/uL (ref 4.0–10.5)

## 2017-09-08 LAB — GLUCOSE, CAPILLARY
Glucose-Capillary: 73 mg/dL (ref 70–99)
Glucose-Capillary: 194 mg/dL — ABNORMAL HIGH (ref 70–99)
Glucose-Capillary: 152 mg/dL — ABNORMAL HIGH (ref 70–99)
Glucose-Capillary: 120 mg/dL — ABNORMAL HIGH (ref 70–99)
Glucose-Capillary: 133 mg/dL — ABNORMAL HIGH (ref 70–99)
Glucose-Capillary: 119 mg/dL — ABNORMAL HIGH (ref 70–99)

## 2017-09-08 LAB — BASIC METABOLIC PANEL
Anion gap: 9 (ref 5–15)
BUN: 17 mg/dL (ref 8–23)
CO2: 30 mmol/L (ref 22–32)
Calcium: 9.2 mg/dL (ref 8.9–10.3)
Chloride: 103 mmol/L (ref 98–111)
Creatinine, Ser: 0.94 mg/dL (ref 0.44–1.00)
GFR calc Af Amer: >60 mL/min (ref >60)
GFR calc non Af Amer: >60 mL/min (ref >60)
Glucose, Bld: 78 mg/dL (ref 70–99)
Potassium: 3.6 mmol/L (ref 3.5–5.1)
Sodium: 142 mmol/L (ref 135–145)

## 2017-09-08 LAB — PLATELET INHIBITION P2Y12: Platelet Function  P2Y12: 280 [PRU] (ref 194–418)

## 2017-09-08 MED ORDER — SERTRALINE HCL 50 MG PO TABS
50.0000 mg | ORAL_TABLET | Freq: Every day | ORAL | Status: DC
  Administered 2017-09-09: 50 mg via ORAL
  Filled 2017-09-08: qty 1

## 2017-09-08 MED ORDER — TICAGRELOR 90 MG PO TABS
180.0000 mg | ORAL_TABLET | Freq: Once | ORAL | Status: AC
  Administered 2017-09-08: 180 mg via ORAL
  Filled 2017-09-08: qty 2

## 2017-09-08 MED ORDER — TICAGRELOR 90 MG PO TABS
90.0000 mg | ORAL_TABLET | Freq: Two times a day (BID) | ORAL | Status: DC
  Administered 2017-09-09: 08:00:00 90 mg via ORAL
  Filled 2017-09-08: qty 1

## 2017-09-08 MED ORDER — ISOSORBIDE MONONITRATE ER 60 MG PO TB24
60.0000 mg | ORAL_TABLET | Freq: Every day | ORAL | Status: DC
  Administered 2017-09-08 – 2017-09-09 (×2): 60 mg via ORAL
  Filled 2017-09-08 (×2): qty 1

## 2017-09-08 NOTE — Progress Notes (Signed)
CARDIAC REHAB PHASE I   PRE:  Rate/Rhythm: 75 SR    BP: sitting 169/75    SaO2:   MODE:  Ambulation: 440 ft   POST:  Rate/Rhythm: 90 SR    BP: sitting 189/90     SaO2:   Pt sts her CP is stabbing 2/10. Sts this has been present since May except it went away briefly yesterday after the procedure. Pt was lightheaded walking and did c/o of increased chest tightness to 3/10. Stumbled x1 due to dizziness. To recliner, BP elevated. BP decreased to 2/10 with rest. Ed complete/reviewed. She understands Plavix/ASA. She is already referred to [facility] but had to wait till October to begin. Will keep that appointment.  [phone]   [place], ACSM 09/08/2017 10:28 AM

## 2017-09-08 NOTE — Progress Notes (Signed)
Pt c/o chest pain 9/10. Pt states like a pressure in her chest. BP=157/70; HR=70. Placed on O2 @2L /min. EKG done. Ntg SL total of 3 doses given with relief. BP=119/81; HR=71. Will continue to monitor pt.

## 2017-09-08 NOTE — Progress Notes (Signed)
Progress Note  Patient Name: [name] Date of Encounter: 09/08/2017  Primary Cardiologist: Sinclair Grooms, MD  Subjective   Had 2 episodes of sharp chest pain this morning. Relieved with nitro.  Inpatient Medications    Scheduled Meds: . aspirin EC  81 mg Oral Daily  . citalopram  40 mg Oral Daily  . clopidogrel  75 mg Oral Daily  . gabapentin  300 mg Oral QHS  . hydrOXYzine  25 mg Oral BID  . insulin aspart protamine- aspart  70 Units Subcutaneous BID WC  . isosorbide mononitrate  60 mg Oral Daily  . levothyroxine  224 mcg Oral QAC breakfast  . losartan  25 mg Oral Daily  . metoprolol succinate  25 mg Oral Daily  . mirtazapine  30 mg Oral QHS  . mometasone-formoterol  2 puff Inhalation BID  . pantoprazole  40 mg Oral Daily  . rosuvastatin  40 mg Oral Daily  . sodium chloride flush  3 mL Intravenous Q12H  . tiotropium  18 mcg Inhalation Daily  . tobramycin-dexamethasone  1 drop Both Eyes Daily   Continuous Infusions: . sodium chloride     PRN Meds: sodium chloride, acetaminophen, cyclobenzaprine, diazepam, HYDROcodone-acetaminophen, nitroGLYCERIN, ondansetron (ZOFRAN) IV, potassium chloride SA, sodium chloride flush   Vital Signs    Vitals:   09/08/17 0645 09/08/17 0703 09/08/17 0846 09/08/17 0847  BP: (!) 142/59 116/76    Pulse:  74    Resp: 13 15    Temp:  97.8 F (36.6 C)    TempSrc:  Oral    SpO2: 97% 96% 96% 96%  Weight:      Height:        Intake/Output Summary (Last 24 hours) at 09/08/2017 0904 Last data filed at 09/08/2017 0700 Gross per 24 hour  Intake 1216.73 ml  Output 450 ml  Net 766.73 ml   Filed Weights   09/07/17 0555 09/08/17 0400  Weight: 200 lb (90.7 kg) 200 lb 9.9 oz (91 kg)    Telemetry    SR - Personally Reviewed  ECG    SR with RBBB  - Personally Reviewed  Physical Exam   General: Well developed, well nourished, female appearing in no acute distress. Head: Normocephalic, atraumatic.  Neck: Supple without  bruits, JVD. Lungs:  Resp regular and unlabored, CTA. Heart: RRR, S1, S2, no murmur; no rub. Abdomen: Soft, non-tender, non-distended with normoactive bowel sounds.  Extremities: No clubbing, cyanosis, edema. Distal pedal pulses are 2+ bilaterally. Right femoral cath site stable. Neuro: Alert and oriented X 3. Moves all extremities spontaneously. Psych: Normal affect.  Labs    Chemistry Recent Labs  Lab 09/03/17 1652 09/08/17 0346  NA 145* 142  K 4.0 3.6  CL 95* 103  CO2 24 30  GLUCOSE 328* 78  BUN 24 17  CREATININE 1.13* 0.94  CALCIUM 9.4 9.2  GFRNONAA 52* >60  GFRAA 60 >60  ANIONGAP  --  9     Hematology Recent Labs  Lab 09/03/17 1652 09/08/17 0346  WBC 5.4 5.8  RBC 4.35 4.06  HGB 12.6 11.9*  HCT 39.2 36.8  MCV 90 90.6  MCH 29.0 29.3  MCHC 32.1 32.3  RDW 15.0 13.6  PLT 160 142*    Cardiac EnzymesNo results for input(s): TROPONINI in the last 168 hours. No results for input(s): TROPIPOC in the last 168 hours.   BNPNo results for input(s): BNP, PROBNP in the last 168 hours.   DDimer No results for input(s):  DDIMER in the last 168 hours.    Radiology    No results found.  Cardiac Studies   Cath: 09/07/17   Mid LAD to Dist LAD lesion is 25% stenosed.  Ost LAD lesion is 25% stenosed.  Ost LAD to Prox LAD lesion is 90% stenosed.  A drug-eluting stent was successfully placed using a STENT SYNERGY DES 3X24, postdilated to 3.8 mm. Prior [facility] PTCA was with 3.5 mm balloon.  Post intervention, there is a 0% residual stenosis.  The left ventricular ejection fraction is 50-55% by visual estimate.  The left ventricular systolic function is normal.  LV end diastolic pressure is mildly elevated.  There is no aortic valve stenosis.     Recommend uninterrupted dual antiplatelet therapy with Aspirin 81mg  daily and Clopidogrel 75mg  daily for a minimum of 12 months (ACS - Class I recommendation).   Likely discharge in AM.  Patient Profile     62  y.o. female hx of asthma, DM-2, GERD, HLD, panic attacks, sleep apnea, thyroid disease and Hx of CAD with PCI to LAD in 2005 with recent PTCA of ISR of mLAD stent who presented back to the office on 09/03/17 with chest pain and dyspnea. Sent for cardiac cath noted above.     Assessment & Plan    1. Unstable angina: Sent to cath after presenting back to the office with chest pain and dyspnea. Cath yesterday noted ISR in the mLAD, with cutting balloon and DES x1 to the mLAD. EF noted at 50-55% with mildly elevated LVEDP. Remains on DAPT with ASA/plavix. Had 2 separate episodes of chest pain this morning relieved with SL nitro.  -- add Imdur -- check P2y12 given this is her second episode of ISR within several months -- work with cardiac rehab today  2. HTN: stable  3. 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 Hgb A1c followed by her PCP.    Signed, Reino Bellis, NP  09/08/2017, 9:04 AM  Pager # [phone]   For questions or updates, please contact [facility] Please consult [email or web address] for contact info under Cardiology/STEMI.

## 2017-09-08 NOTE — Progress Notes (Signed)
Pt c/o sharp chest pain @6 :30 this morning. BP=164/52; HR=77. Ntg SL x2 doses given as PRN dose. EKG done. PA Roby Lofts informed. Currently pt is chest pain free. BP=142/59; HR=77. Will continue to monitor pt.

## 2017-09-09 DIAGNOSIS — K219 Gastro-esophageal reflux disease without esophagitis: Secondary | ICD-10-CM | POA: Diagnosis not present

## 2017-09-09 DIAGNOSIS — Z79899 Other long term (current) drug therapy: Secondary | ICD-10-CM | POA: Diagnosis not present

## 2017-09-09 DIAGNOSIS — Z8249 Family history of ischemic heart disease and other diseases of the circulatory system: Secondary | ICD-10-CM | POA: Diagnosis not present

## 2017-09-09 DIAGNOSIS — G4730 Sleep apnea, unspecified: Secondary | ICD-10-CM | POA: Diagnosis not present

## 2017-09-09 DIAGNOSIS — I25119 Atherosclerotic heart disease of native coronary artery with unspecified angina pectoris: Secondary | ICD-10-CM | POA: Diagnosis not present

## 2017-09-09 DIAGNOSIS — I951 Orthostatic hypotension: Secondary | ICD-10-CM | POA: Diagnosis not present

## 2017-09-09 DIAGNOSIS — I209 Angina pectoris, unspecified: Secondary | ICD-10-CM | POA: Diagnosis not present

## 2017-09-09 DIAGNOSIS — I4510 Unspecified right bundle-branch block: Secondary | ICD-10-CM | POA: Diagnosis not present

## 2017-09-09 DIAGNOSIS — E7849 Other hyperlipidemia: Secondary | ICD-10-CM | POA: Diagnosis not present

## 2017-09-09 DIAGNOSIS — I10 Essential (primary) hypertension: Secondary | ICD-10-CM | POA: Diagnosis not present

## 2017-09-09 DIAGNOSIS — J45909 Unspecified asthma, uncomplicated: Secondary | ICD-10-CM | POA: Diagnosis not present

## 2017-09-09 DIAGNOSIS — Z79890 Hormone replacement therapy (postmenopausal): Secondary | ICD-10-CM | POA: Diagnosis not present

## 2017-09-09 DIAGNOSIS — E079 Disorder of thyroid, unspecified: Secondary | ICD-10-CM | POA: Diagnosis not present

## 2017-09-09 DIAGNOSIS — Z885 Allergy status to narcotic agent status: Secondary | ICD-10-CM | POA: Diagnosis not present

## 2017-09-09 DIAGNOSIS — Z9049 Acquired absence of other specified parts of digestive tract: Secondary | ICD-10-CM | POA: Diagnosis not present

## 2017-09-09 DIAGNOSIS — Z955 Presence of coronary angioplasty implant and graft: Secondary | ICD-10-CM | POA: Diagnosis not present

## 2017-09-09 DIAGNOSIS — Z888 Allergy status to other drugs, medicaments and biological substances status: Secondary | ICD-10-CM | POA: Diagnosis not present

## 2017-09-09 DIAGNOSIS — E785 Hyperlipidemia, unspecified: Secondary | ICD-10-CM | POA: Diagnosis not present

## 2017-09-09 DIAGNOSIS — Z836 Family history of other diseases of the respiratory system: Secondary | ICD-10-CM | POA: Diagnosis not present

## 2017-09-09 DIAGNOSIS — Z7982 Long term (current) use of aspirin: Secondary | ICD-10-CM | POA: Diagnosis not present

## 2017-09-09 DIAGNOSIS — Z7901 Long term (current) use of anticoagulants: Secondary | ICD-10-CM | POA: Diagnosis not present

## 2017-09-09 DIAGNOSIS — Z794 Long term (current) use of insulin: Secondary | ICD-10-CM | POA: Diagnosis not present

## 2017-09-09 DIAGNOSIS — E119 Type 2 diabetes mellitus without complications: Secondary | ICD-10-CM | POA: Diagnosis not present

## 2017-09-09 DIAGNOSIS — I25110 Atherosclerotic heart disease of native coronary artery with unstable angina pectoris: Secondary | ICD-10-CM

## 2017-09-09 LAB — GLUCOSE, CAPILLARY
Glucose-Capillary: 65 mg/dL — ABNORMAL LOW (ref 70–99)
Glucose-Capillary: 126 mg/dL — ABNORMAL HIGH (ref 70–99)
Glucose-Capillary: 126 mg/dL — ABNORMAL HIGH (ref 70–99)
Glucose-Capillary: 103 mg/dL — ABNORMAL HIGH (ref 70–99)

## 2017-09-09 MED ORDER — INSULIN ASPART PROT & ASPART (70-30 MIX) 100 UNIT/ML ~~LOC~~ SUSP
40.0000 [IU] | Freq: Two times a day (BID) | SUBCUTANEOUS | Status: DC
  Administered 2017-09-09: 08:00:00 40 [IU] via SUBCUTANEOUS
  Filled 2017-09-09: qty 10

## 2017-09-09 MED ORDER — TICAGRELOR 90 MG PO TABS: 90.0000 mg | ORAL_TABLET | Freq: Two times a day (BID) | ORAL | 2 refills | Status: DC

## 2017-09-09 NOTE — Progress Notes (Signed)
CARDIAC REHAB PHASE I   PRE:  Rate/Rhythm: 74 SR    BP: sitting 147/69    SaO2:   MODE:  Ambulation: 500 ft   POST:  Rate/Rhythm: 95 SR    BP: sitting 188/83     SaO2:   Tolerated fairly well. Denied CP. She became more SOB with distance, prob due to deconditioning. Encouraged increasing exercise/walking at home. Discussed importance of Brilinta x2 qd. [phone]   [place], ACSM 09/09/2017 9:19 AM

## 2017-09-09 NOTE — Discharge Summary (Signed)
Discharge Summary    Patient ID: [name],  MRN: [id number], DOB/AGE: April 18, 1955 62 y.o.  Admit date: 09/07/2017 Discharge date: 09/09/2017  Primary Care Provider: Sinda Du Primary Cardiologist: Dr. Tamala Julian  Discharge Diagnoses    Active Problems:   Angina pectoris [facility])   Hypertension   Hyperlipidemia   Coronary artery disease involving native coronary artery of native heart with unstable angina pectoris (HCC)   Allergies Allergies  Allergen Reactions  . Tape Other (See Comments)    Adhesive breaks me out (rash)  . Morphine Itching    Diagnostic Studies/Procedures    Cath: 09/07/17   Mid LAD to Dist LAD lesion is 25% stenosed.  Ost LAD lesion is 25% stenosed.  Ost LAD to Prox LAD lesion is 90% stenosed.  A drug-eluting stent was successfully placed using a STENT SYNERGY DES [id number], postdilated to 3.8 mm. Prior [facility] PTCA was with 3.5 mm balloon.  Post intervention, there is a 0% residual stenosis.  The left ventricular ejection fraction is 50-55% by visual estimate.  The left ventricular systolic function is normal.  LV end diastolic pressure is mildly elevated.  There is no aortic valve stenosis.     Recommend uninterrupted dual antiplatelet therapy with Aspirin 81mg  daily and Clopidogrel 75mg  daily for a minimum of 12 months (ACS - Class I recommendation).   Likely discharge in AM. _____________   History of Present Illness     62 y.o. female with a hx of asthma, DM-2, GERD, HLD, panic attacks, sleep apnea, thyroid disease. Hx of CAD with PCI to LAD in 2005  and most recent in 06/2017 she had admit with chest pain and near syncope and under went cardiac cath and had severe ISR of overlapping cyper DES in p-mLAD and underwent scoring balloon angioplasty and PTCA.  She also had RBBB on EKG that was new. Also with orthostatic hypotension.     She presented back to the office on 8/2 and reported having chest pain and DOE, the pain had  awakened her from sleep. She did have nausea with it as well.  This had been on going for a few weeks. She was having pain in the office EKG without acute changes, + RBBB which has been present.  No palpitations, occ feels lightheaded.  Given her symptoms she was sent for cardiac cath.    Hospital Course     Underwent cardiac cath noted above with ISR in the mLAD, with cutting balloon and DES x1 to the mLAD. EF noted at 50-55% with mildly elevated LVEDP. Plan was originally for DAPT with ASA/plavix. Given her recurrence of ISR, P2y12 was checked at 280 indicating a Plavix nonresponder. She was switched to Brilinta and reloaded. Also resumed her Imdur 60mg  daily as she had some residual chest pain. This seemed to be MSK related and did improve. Able to work with cardiac rehab.   General: Well developed, well nourished, female appearing in no acute distress. Head: Normocephalic, atraumatic.  Neck: Supple without bruits, JVD. Lungs:  Resp regular and unlabored, CTA. Heart: RRR, S1, S2, no murmur; no rub. Abdomen: Soft, non-tender, non-distended with normoactive bowel sounds.  Extremities: No clubbing, cyanosis, edema. Distal pedal pulses are 2+ bilaterally. R femoral cath site stable without bruising or hematoma Neuro: Alert and oriented X 3. Moves all extremities spontaneously. Psych: Normal affect.  ZEANNA SUNDE was seen by Dr. Martinique and determined stable for discharge home. Follow up in the office has been arranged. Medications are  listed below.  _____________  Discharge Vitals Blood pressure (!) 151/74, pulse 74, temperature 97.8 F (36.6 C), temperature source Oral, resp. rate 16, height 5\' 4"  (1.626 m), weight 91.5 kg, SpO2 97 %.  Filed Weights   09/07/17 0555 09/08/17 0400 09/09/17 0227  Weight: 90.7 kg 91 kg 91.5 kg    Labs & Radiologic Studies    CBC Recent Labs    09/08/17 0346  WBC 5.8  HGB 11.9*  HCT 36.8  MCV 90.6  PLT 350*   Basic Metabolic Panel Recent Labs     09/08/17 0346  NA 142  K 3.6  CL 103  CO2 30  GLUCOSE 78  BUN 17  CREATININE 0.94  CALCIUM 9.2   Liver Function Tests No results for input(s): AST, ALT, ALKPHOS, BILITOT, PROT, ALBUMIN in the last 72 hours. No results for input(s): LIPASE, AMYLASE in the last 72 hours. Cardiac Enzymes No results for input(s): CKTOTAL, CKMB, CKMBINDEX, TROPONINI in the last 72 hours. BNP Invalid input(s): POCBNP D-Dimer No results for input(s): DDIMER in the last 72 hours. Hemoglobin A1C No results for input(s): HGBA1C in the last 72 hours. Fasting Lipid Panel No results for input(s): CHOL, HDL, LDLCALC, TRIG, CHOLHDL, LDLDIRECT in the last 72 hours. Thyroid Function Tests No results for input(s): TSH, T4TOTAL, T3FREE, THYROIDAB in the last 72 hours.  Invalid input(s): FREET3 _____________  No results found. Disposition   Pt is being discharged home today in good condition.  Follow-up Plans & Appointments    Follow-up Information    Isaiah Serge, NP Follow up on 09/23/2017.   Specialties:  Cardiology, Radiology Why:  at 2pm for your follow up appt.  Contact information: [place] [address] [phone]          Discharge Instructions    AMB Referral to Cardiac Rehabilitation - Phase II   Complete by:  As directed    Diagnosis:  Coronary Stents   Call MD for:  redness, tenderness, or signs of infection (pain, swelling, redness, odor or green/yellow discharge around incision site)   Complete by:  As directed    Diet - low sodium heart healthy   Complete by:  As directed    Discharge instructions   Complete by:  As directed    Groin Site Care Refer to this sheet in the next few weeks. These instructions provide you with information on caring for yourself after your procedure. Your caregiver may also give you more specific instructions. Your treatment has been planned according to current medical practices, but problems sometimes occur. Call your  caregiver if you have any problems or questions after your procedure. HOME CARE INSTRUCTIONS You may shower 24 hours after the procedure. Remove the bandage (dressing) and gently wash the site with plain soap and water. Gently pat the site dry.  Do not apply powder or lotion to the site.  Do not sit in a bathtub, swimming pool, or whirlpool for 5 to 7 days.  No bending, squatting, or lifting anything over 10 pounds (4.5 kg) as directed by your caregiver.  Inspect the site at least twice daily.  Do not drive home if you are discharged the same day of the procedure. Have someone else drive you.  You may drive 24 hours after the procedure unless otherwise instructed by your caregiver.  What to expect: Any bruising will usually fade within 1 to 2 weeks.  Blood that collects in the tissue (hematoma) may be painful  to the touch. It should usually decrease in size and tenderness within 1 to 2 weeks.  SEEK IMMEDIATE MEDICAL CARE IF: You have unusual pain at the groin site or down the affected leg.  You have redness, warmth, swelling, or pain at the groin site.  You have drainage (other than a small amount of blood on the dressing).  You have chills.  You have a fever or persistent symptoms for more than 72 hours.  You have a fever and your symptoms suddenly get worse.  Your leg becomes pale, cool, tingly, or numb.  You have heavy bleeding from the site. Hold pressure on the site. Marland Kitchen  PLEASE DO NOT MISS ANY DOSES OF YOUR BRILINTA!!!!! Also keep a log of you blood pressures and bring back to your follow up appt. Please call the office with any questions.   Patients taking blood thinners should generally stay away from medicines like ibuprofen, Advil, Motrin, naproxen, and Aleve due to risk of stomach bleeding. You may take Tylenol as directed or talk to your primary doctor about alternatives.   Increase activity slowly   Complete by:  As directed        Discharge Medications     Medication  List    STOP taking these medications   clopidogrel 75 MG tablet Commonly known as:  PLAVIX     TAKE these medications   aspirin EC 81 MG tablet Take 81 mg by mouth daily.   budesonide-formoterol 160-4.5 MCG/ACT inhaler Commonly known as:  SYMBICORT Inhale 2 puffs into the lungs 2 (two) times daily.   citalopram 40 MG tablet Commonly known as:  CELEXA Take 40 mg by mouth daily.   cyclobenzaprine 10 MG tablet Commonly known as:  FLEXERIL Take 10 mg by mouth 3 (three) times daily as needed for muscle spasms.   diazepam 5 MG tablet Commonly known as:  VALIUM Take 5 mg by mouth every 8 (eight) hours as needed for anxiety.   diclofenac sodium 1 % Gel Commonly known as:  VOLTAREN APPLY TO AFFECTED AREA(S) THREE TIMES DAILY AS NEEDED   furosemide 40 MG tablet Commonly known as:  LASIX Take 40 mg by mouth 2 (two) times daily.   gabapentin 300 MG capsule Commonly known as:  NEURONTIN Take 300 mg by mouth at bedtime.   hydrOXYzine 25 MG tablet Commonly known as:  ATARAX/VISTARIL Take 25 mg by mouth 2 (two) times daily.   insulin lispro protamine-lispro (75-25) 100 UNIT/ML Susp injection Commonly known as:  HUMALOG 75/25 MIX Inject 70-75 Units into the skin 2 (two) times daily with a meal. Takes 75 units in am and 70 units in pm   isosorbide mononitrate 60 MG 24 hr tablet Commonly known as:  IMDUR Take 1 tablet (60 mg total) by mouth daily.   levothyroxine 112 MCG tablet Commonly known as:  SYNTHROID, LEVOTHROID Take 224 mcg by mouth daily before breakfast.   losartan 25 MG tablet Commonly known as:  COZAAR Take 1 tablet (25 mg total) by mouth daily.   Magnesium Oxide 400 (240 Mg) MG Tabs Take 400 mg by mouth 2 (two) times daily.   meclizine 25 MG tablet Commonly known as:  ANTIVERT Take 25 mg by mouth 4 (four) times daily as needed for dizziness.   metFORMIN 500 MG tablet Commonly known as:  GLUCOPHAGE Take 500 mg by mouth 2 (two) times daily with a meal.     metoprolol succinate 25 MG 24 hr tablet Commonly known as:  TOPROL-XL  Take 1 tablet (25 mg total) by mouth daily.   mirtazapine 30 MG tablet Commonly known as:  REMERON Take 30 mg by mouth at bedtime.   mupirocin ointment 2 % Commonly known as:  BACTROBAN Apply 1 application topically daily as needed for rash.   nitroGLYCERIN 0.4 MG SL tablet Commonly known as:  NITROSTAT PLACE 1 TABLET (0.4 MG TOTAL) UNDER THE TONGUE EVERY 5 (FIVE) MINUTES AS NEEDED FOR CHEST PAIN.   ondansetron 4 MG tablet Commonly known as:  ZOFRAN Take 4 mg by mouth 4 (four) times daily as needed for nausea or vomiting.   oxyCODONE-acetaminophen 10-325 MG tablet Commonly known as:  PERCOCET Take 1 tablet by mouth every 4 (four) hours as needed for pain.   pantoprazole 40 MG tablet Commonly known as:  PROTONIX TAKE 1 TABLET (40 MG TOTAL) BY MOUTH DAILY. PLEASE KEEP UPCOMING APPT FOR FUTURE REFILLS. THANK YOU   PARoxetine 20 MG tablet Commonly known as:  PAXIL Take 20 mg by mouth daily.   potassium chloride SA 20 MEQ tablet Commonly known as:  K-DUR,KLOR-CON Take 40 mEq by mouth daily as needed (leg cramps).   rosuvastatin 40 MG tablet Commonly known as:  CRESTOR Take 40 mg by mouth daily.   sertraline 50 MG tablet Commonly known as:  ZOLOFT Take 50 mg by mouth daily.   ticagrelor 90 MG Tabs tablet Commonly known as:  BRILINTA Take 1 tablet (90 mg total) by mouth 2 (two) times daily.   tiotropium 18 MCG inhalation capsule Commonly known as:  SPIRIVA Place 18 mcg into inhaler and inhale daily as needed (asthma).   tobramycin-dexamethasone ophthalmic solution Commonly known as:  TOBRADEX Place 1 drop into both eyes daily.   VITAMIN D3 SUPER STRENGTH 2000 units Tabs Generic drug:  Cholecalciferol Take 2,000 Units by mouth daily.       Acute coronary syndrome (MI, NSTEMI, STEMI, etc) this admission?: No.   Outstanding Labs/Studies   N/a   Duration of Discharge Encounter   Greater  than 30 minutes including physician time.  Signed, Reino Bellis NP-C 09/09/2017, 8:24 AM

## 2017-09-09 NOTE — Progress Notes (Signed)
Discharge instructions reviewed with pt.  Copy of instructions given to pt, scripts sent to pt's pharmacy by MD.  Pt waiting for son to arrive for transportation home.

## 2017-09-09 NOTE — Care Management Note (Addendum)
Case Management Note  Patient Details  Name: [name] MRN: [id number] Date of Birth: 28-Sep-1955  Subjective/Objective:     From home, s/p stent intervention will be on brilinta, patient has Medicaid insurance and has co pay of 3.00.  [name] is a preferred Medicaid medication.  RN will give patient the 30 day savings coupon.  [facility] [place] has brilinta in stock.              Action/Plan: DC home when ready.  Expected Discharge Date:                  Expected Discharge Plan:  Home/Self Care  In-House Referral:     Discharge planning Services  CM Consult  Post Acute Care Choice:    Choice offered to:     DME Arranged:    DME Agency:     HH Arranged:    HH Agency:     Status of Service:  In process, will continue to follow  If discussed at Long Length of Stay Meetings, dates discussed:    Additional Comments:  Sabrina Mayo, RN 09/09/2017, 9:11 AM

## 2017-09-09 NOTE — Progress Notes (Signed)
  Pt d/c'd via wheelchair with belongings, with family.            Escorted by unit NT.

## 2017-09-09 NOTE — Progress Notes (Signed)
Hypoglycemic Event  CBG: 65 ( 01:18)  Treatment: 15 GM carbohydrate snack  Symptoms: Shaky  Follow-up CBG: Time:02:26 CBG Result:126  Possible Reasons for Event: Medication Regimen

## 2017-09-23 ENCOUNTER — Ambulatory Visit (INDEPENDENT_AMBULATORY_CARE_PROVIDER_SITE_OTHER): Payer: Medicare Other | Admitting: Cardiology

## 2017-09-23 ENCOUNTER — Encounter: Payer: Self-pay | Admitting: Cardiology

## 2017-09-23 VITALS — BP 117/75 | HR 94 | Ht 64.0 in | Wt 200.0 lb

## 2017-09-23 DIAGNOSIS — I2510 Atherosclerotic heart disease of native coronary artery without angina pectoris: Secondary | ICD-10-CM | POA: Diagnosis not present

## 2017-09-23 DIAGNOSIS — R0602 Shortness of breath: Secondary | ICD-10-CM | POA: Diagnosis not present

## 2017-09-23 DIAGNOSIS — I10 Essential (primary) hypertension: Secondary | ICD-10-CM | POA: Diagnosis not present

## 2017-09-23 DIAGNOSIS — Z95820 Peripheral vascular angioplasty status with implants and grafts: Secondary | ICD-10-CM

## 2017-09-23 DIAGNOSIS — Z794 Long term (current) use of insulin: Secondary | ICD-10-CM

## 2017-09-23 DIAGNOSIS — E119 Type 2 diabetes mellitus without complications: Secondary | ICD-10-CM

## 2017-09-23 DIAGNOSIS — E782 Mixed hyperlipidemia: Secondary | ICD-10-CM | POA: Diagnosis not present

## 2017-09-23 DIAGNOSIS — I2583 Coronary atherosclerosis due to lipid rich plaque: Secondary | ICD-10-CM

## 2017-09-23 NOTE — Progress Notes (Signed)
Cardiology Office Note   Date:  09/23/2017   ID:  Geniva, Lohnes [birth year], MRN [id number]  PCP:  Sinda Du, MD  Cardiologist:  Dr. Tamala Julian     Chief Complaint  Patient presents with  . Coronary Artery Disease  . Hospitalization Follow-up      History of Present Illness: [name] is a 62 y.o. female who presents for post hospitalization for cardiac cath with stent placed to Ost to Prox LAD for 90% stenosis.  DES this was at site of prior PTCA.   Non Responder to Plavix.  Now on Brilinta  She has a hx of asthma, DM-2, GERD, HLD, panic attacks, sleep apnea, thyroid disease and   Hx of CAD with PCI to LAD in 2005   And in 06/2017 she had admit wityh chest pain and near syncope and under went cardiac cath and had severe ISR of overlapping cyper DES in p-mLAD and underwent scoring balloon angioplasty and PTCA.  She also had RBBB on EKG that was new.   Also with orthostatic hypotension.     Today she still has some chest pain, one spot on Lt ant chest and is tender to palpation.  She is having eye blurring and SOB.     Also headache in back of her head.  She is worried the site may be closing up.  We discussed now with stent less likely this will happen.  Her symptoms are similar to when she left the hospital.  Also BP at home is very high at times.   Past Medical History:  Diagnosis Date  . Arthritis   . Asthma   . Chest pain   . Coronary artery disease    a. LAD PCI in 2005 b. cath in 2014 showed patent pLAD stent c. Cath 06/11/17 severe ISR of overlapping Cypher DES in pLAD s/p cutting ballon PTCA only; mild dLAD dz  . Diabetes mellitus   . Diabetic neuropathy ([place])   . GERD (gastroesophageal reflux disease)   . History of skin cancer   . Hypercholesteremia   . Hypertension   . Myocardial infarction ([place])   . Panic attack   . Sleep apnea   . Thyroid disease     Past Surgical History:  Procedure Laterality Date  . ABDOMINAL HYSTERECTOMY    . BACK  SURGERY    . CHOLECYSTECTOMY    . CORONARY ANGIOPLASTY WITH STENT PLACEMENT    . CORONARY BALLOON ANGIOPLASTY N/A 06/11/2017   Procedure: CORONARY BALLOON ANGIOPLASTY;  Surgeon: Leonie Man, MD;  Location: [place] [facility];  Service: Cardiovascular;  Laterality: N/A;  LAD  . CORONARY STENT INTERVENTION  09/07/2017   STENT SYNERGY DES 3X24  . CORONARY STENT INTERVENTION N/A 09/07/2017   Procedure: CORONARY STENT INTERVENTION;  Surgeon: Jettie Booze, MD;  Location: [place] [facility];  Service: Cardiovascular;  Laterality: N/A;  . ELBOW SURGERY    . KNEE SURGERY    . LEFT HEART CATH AND CORONARY ANGIOGRAPHY N/A 06/11/2017   Procedure: LEFT HEART CATH AND CORONARY ANGIOGRAPHY;  Surgeon: Leonie Man, MD;  Location: [place] [facility];  Service: Cardiovascular;  Laterality: N/A;  . LEFT HEART CATH AND CORONARY ANGIOGRAPHY N/A 09/07/2017   Procedure: LEFT HEART CATH AND CORONARY ANGIOGRAPHY;  Surgeon: Jettie Booze, MD;  Location: [place] [facility];  Service: Cardiovascular;  Laterality: N/A;  . LEFT HEART CATHETERIZATION WITH CORONARY ANGIOGRAM N/A 06/23/2012   Procedure: LEFT HEART CATHETERIZATION WITH  CORONARY ANGIOGRAM;  Surgeon: Sinclair Grooms, MD;  Location: [facility];  Service: Cardiovascular;  Laterality: N/A;     Current Outpatient Medications  Medication Sig Dispense Refill  . aspirin EC 81 MG tablet Take 81 mg by mouth daily.    . budesonide-formoterol (SYMBICORT) 160-4.5 MCG/ACT inhaler Inhale 2 puffs into the lungs 2 (two) times daily.    . Cholecalciferol (VITAMIN D3 SUPER STRENGTH) 2000 units TABS Take 2,000 Units by mouth daily.     . citalopram (CELEXA) 40 MG tablet Take 40 mg by mouth daily.  3  . cyclobenzaprine (FLEXERIL) 10 MG tablet Take 10 mg by mouth 3 (three) times daily as needed for muscle spasms.    . diazepam (VALIUM) 5 MG tablet Take 5 mg by mouth every 8 (eight) hours as needed for anxiety.    . diclofenac sodium (VOLTAREN) 1 % GEL APPLY  TO AFFECTED AREA(S) THREE TIMES DAILY AS NEEDED  0  . furosemide (LASIX) 40 MG tablet Take 40 mg by mouth 2 (two) times daily.      Marland Kitchen gabapentin (NEURONTIN) 300 MG capsule Take 300 mg by mouth at bedtime.    . hydrOXYzine (ATARAX/VISTARIL) 25 MG tablet Take 25 mg by mouth 2 (two) times daily.   5  . insulin lispro protamine-lispro (HUMALOG 75/25) (75-25) 100 UNIT/ML SUSP Inject 70-75 Units into the skin 2 (two) times daily with a meal. Takes 75 units in am and 70 units in pm     . isosorbide mononitrate (IMDUR) 60 MG 24 hr tablet Take 1 tablet (60 mg total) by mouth daily. 90 tablet 3  . levothyroxine (SYNTHROID, LEVOTHROID) 112 MCG tablet Take 224 mcg by mouth daily before breakfast.     . losartan (COZAAR) 25 MG tablet Take 1 tablet (25 mg total) by mouth daily. 30 tablet 3  . Magnesium Oxide 400 (240 Mg) MG TABS Take 400 mg by mouth 2 (two) times daily.  5  . meclizine (ANTIVERT) 25 MG tablet Take 25 mg by mouth 4 (four) times daily as needed for dizziness.   5  . metFORMIN (GLUCOPHAGE) 500 MG tablet Take 500 mg by mouth 2 (two) times daily with a meal.     . metoprolol succinate (TOPROL-XL) 25 MG 24 hr tablet Take 1 tablet (25 mg total) by mouth daily. 90 tablet 3  . mirtazapine (REMERON) 30 MG tablet Take 30 mg by mouth at bedtime.    . mupirocin ointment (BACTROBAN) 2 % Apply 1 application topically daily as needed for rash.  1  . nitroGLYCERIN (NITROSTAT) 0.4 MG SL tablet PLACE 1 TABLET (0.4 MG TOTAL) UNDER THE TONGUE EVERY 5 (FIVE) MINUTES AS NEEDED FOR CHEST PAIN. 25 tablet 3  . ondansetron (ZOFRAN) 4 MG tablet Take 4 mg by mouth 4 (four) times daily as needed for nausea or vomiting.   3  . oxyCODONE-acetaminophen (PERCOCET) 10-325 MG tablet Take 1 tablet by mouth every 4 (four) hours as needed for pain.    . pantoprazole (PROTONIX) 40 MG tablet TAKE 1 TABLET (40 MG TOTAL) BY MOUTH DAILY. PLEASE KEEP UPCOMING APPT FOR FUTURE REFILLS. THANK YOU 30 tablet 1  . PARoxetine (PAXIL) 20 MG tablet  Take 20 mg by mouth daily.  3  . potassium chloride SA (K-DUR,KLOR-CON) 20 MEQ tablet Take 40 mEq by mouth daily as needed (leg cramps).     . rosuvastatin (CRESTOR) 40 MG tablet Take 40 mg by mouth daily.      . sertraline (  ZOLOFT) 50 MG tablet Take 50 mg by mouth daily.  12  . ticagrelor (BRILINTA) 90 MG TABS tablet Take 1 tablet (90 mg total) by mouth 2 (two) times daily. 180 tablet 2  . tiotropium (SPIRIVA) 18 MCG inhalation capsule Place 18 mcg into inhaler and inhale daily as needed (asthma).     . tobramycin-dexamethasone (TOBRADEX) ophthalmic solution Place 1 drop into both eyes daily.      No current facility-administered medications for this visit.     Allergies:   Tape and Morphine    Social History:  The patient  reports that she has never smoked. She has never used smokeless tobacco. She reports that she does not drink alcohol or use drugs.   Family History:  The patient's family history includes COPD in her mother; Heart attack in her father; Heart disease in her father.    ROS:  General:no colds or fevers, no weight changes Skin:no rashes or ulcers HEENT:no blurred vision, no congestion CV:see HPI PUL:see HPI GI:no diarrhea constipation or melena, no indigestion GU:no hematuria, no dysuria MS:no joint pain, no claudication Neuro:no syncope, no lightheadedness Endo:+ diabetes on insulin, no thyroid disease  Wt Readings from Last 3 Encounters:  09/23/17 200 lb (90.7 kg)  09/09/17 201 lb 11.5 oz (91.5 kg)  09/03/17 203 lb 12.8 oz (92.4 kg)     PHYSICAL EXAM: VS:  BP 117/75   Pulse 94   Ht 5\' 4"  (1.626 m)   Wt 200 lb (90.7 kg)   SpO2 97%   BMI 34.33 kg/m  , BMI Body mass index is 34.33 kg/m. General:Pleasant affect, NAD Skin:Warm and dry, brisk capillary refill HEENT:normocephalic, sclera clear, mucus membranes moist Neck:supple, no JVD, no bruits  Heart:S1S2 RRR without murmur, gallup, rub or click Lungs:clear without rales, rhonchi, or wheezes UDJ:SHFW,  non tender, + BS, do not palpate liver spleen or masses Ext:no lower ext edema, 2+ pedal pulses, 2+ radial pulses Neuro:alert and oriented X 3, MAE, follows commands, + facial symmetry    EKG:  EKG is ordered today. The ekg ordered today demonstrates SR LAD and RBBB, no acute changes   Recent Labs: 06/10/2017: Magnesium 1.5; TSH 0.057 06/11/2017: ALT 27 09/08/2017: BUN 17; Creatinine, Ser 0.94; Hemoglobin 11.9; Platelets 142; Potassium 3.6; Sodium 142    Lipid Panel No results found for: CHOL, TRIG, HDL, CHOLHDL, VLDL, LDLCALC, LDLDIRECT     Other studies Reviewed: Additional studies/ records that were reviewed today include:    Mid LAD to Dist LAD lesion is 25% stenosed.  Ost LAD lesion is 25% stenosed.  Ost LAD to Prox LAD lesion is 90% stenosed.  A drug-eluting stent was successfully placed using a STENT SYNERGY DES 3X24, postdilated to 3.8 mm. Prior [facility] PTCA was with 3.5 mm balloon.  Post intervention, there is a 0% residual stenosis.  The left ventricular ejection fraction is 50-55% by visual estimate.  The left ventricular systolic function is normal.  LV end diastolic pressure is mildly elevated.  There is no aortic valve stenosis.     Recommend uninterrupted dual antiplatelet therapy with Aspirin 81mg  daily and Clopidogrel 75mg  daily for a minimum of 12 months (ACS - Class I recommendation).    . Cardiac cath 06/11/17  Ost LAD to Prox LAD overlapping Cypher DES stents (mid/overlap segment) are 80% stenosed.  Scoring balloon angioplasty was performed using a BALLOON [place] 3.00X15 followed by post-dilation with a BALLOON SAPPHIRE [place] 2 3.5X15.  Post intervention, there is a 0% residual stenosis.  Mid  LAD lesion is 50% stenosed. Distal LAD is diffusely diseased with up to 20% stenosis.  The left ventricular systolic function is normal. The left ventricular ejection fraction is greater than 65% by visual estimate.  LV end diastolic pressure is  moderately elevated.  Severe ISR of overlapping Cypher DES in p-mLAD -- successful cutting balloon PTCA & post-dilation to 3.6 mm. Diffuse mid-distal LAD disease - no change. Otherwise no notable Ramus, Cx-OM or RCA disease   Echo complete 06/12/17  Study Conclusions  - Left ventricle: The cavity size was normal. There was mild focal basal hypertrophy of the septum. Systolic function was normal. The estimated ejection fraction was in the range of 60% to 65%. Wall motion was normal; there were no regional wall motion abnormalities. Doppler parameters are consistent with abnormal left ventricular relaxation (grade 1 diastolic dysfunction). There was no evidence of elevated ventricular filling pressure by Doppler parameters. - Mitral valve: There was mild regurgitation. - Right ventricle: The cavity size was normal. Wall thickness was normal. Systolic function was normal. - Tricuspid valve: There was mild regurgitation. - Pulmonary arteries: Systolic pressure was mildly increased. PA peak pressure: 35 mm Hg (S). - Inferior vena cava: The vessel was normal in size. - Pericardium, extracardiac: There was no pericardial effusion.   ASSESSMENT AND PLAN:  1.  CAD with recent unstable angina and cath with 90% stensos in ost to prox LAD with stent placed.   Was found to be unresponsive to plavix and placed on brilinta.  At discharge she did have chest wall pain.  Will check BMP today.   EF was 50-55%  Will follow up with me in 2 weeks.  She will call if symptoms increase in the meantime.  2.   Chest pain, still with chest wall. Reassured.   3.    DOE and at rest, worried about Brilinta as cause, will check Pro BNP and CXR if normal will change to effient.  Discussed with Dr. Johnsie Cancel DOD.  Is on lasix and will continue.   4.    Headache could be from imdur will stop and monitor BP if climbs may need to increase toprol or losartan.   5.   DM per pcp  Current  medicines are reviewed with the patient today.  The patient Has no concerns regarding medicines.  The following changes have been made:  See above Labs/ tests ordered today include:see above  Disposition:   FU:  see above  Signed, Sabrina Kicks, NP  09/23/2017 3:14 PM    [address], [place], [place], [place] Phone: [phone]; Fax: [phone]

## 2017-09-23 NOTE — Patient Instructions (Addendum)
Medication Instructions:  1. STOP IMDUR   Labwork: TODAY CBC, BMET, PRO BNP  Testing/Procedures: CHEST X-RAY TODAY AT  IMAGING [place]  Follow-Up: 1. Cecilie Kicks, NP IN 2 WEEKS PER LAURA INGOLD, NP  2. DR. Tamala Julian IN 8-10 WEEKS   Any Other Special Instructions Will Be Listed Below (If Applicable).     If you need a refill on your cardiac medications before your next appointment, please call your pharmacy.

## 2017-09-24 ENCOUNTER — Telehealth: Payer: Self-pay | Admitting: *Deleted

## 2017-09-24 ENCOUNTER — Telehealth: Payer: Self-pay | Admitting: Internal Medicine

## 2017-09-24 ENCOUNTER — Telehealth: Payer: Self-pay | Admitting: Interventional Cardiology

## 2017-09-24 DIAGNOSIS — R0602 Shortness of breath: Secondary | ICD-10-CM

## 2017-09-24 LAB — BASIC METABOLIC PANEL
BUN/Creatinine Ratio: 14 (ref 12–28)
BUN: 20 mg/dL (ref 8–27)
CO2: 25 mmol/L (ref 20–29)
Calcium: 9.5 mg/dL (ref 8.7–10.3)
Chloride: 88 mmol/L — ABNORMAL LOW (ref 96–106)
Creatinine, Ser: 1.43 mg/dL — ABNORMAL HIGH (ref 0.57–1.00)
GFR calc Af Amer: 45 mL/min/{1.73_m2} — ABNORMAL LOW (ref >59)
GFR calc non Af Amer: 39 mL/min/{1.73_m2} — ABNORMAL LOW (ref >59)
Glucose: 526 mg/dL (ref 65–99)
Potassium: 4.9 mmol/L (ref 3.5–5.2)
Sodium: 131 mmol/L — ABNORMAL LOW (ref 134–144)

## 2017-09-24 LAB — CBC
Hematocrit: 37.8 % (ref 34.0–46.6)
Hemoglobin: 12.8 g/dL (ref 11.1–15.9)
MCH: 29.8 pg (ref 26.6–33.0)
MCHC: 33.9 g/dL (ref 31.5–35.7)
MCV: 88 fL (ref 79–97)
Platelets: 181 10*3/uL (ref 150–450)
RBC: 4.29 x10E6/uL (ref 3.77–5.28)
RDW: 14.5 % (ref 12.3–15.4)
WBC: 7.2 10*3/uL (ref 3.4–10.8)

## 2017-09-24 LAB — PRO B NATRIURETIC PEPTIDE: NT-Pro BNP: 24 pg/mL (ref 0–287)

## 2017-09-24 NOTE — Telephone Encounter (Signed)
Left message to go over lab results and recommendations, as well as pt still needs to get CXR.

## 2017-09-24 NOTE — Telephone Encounter (Signed)
[facility] called with a critical glucose of 526.  Already addressed by Cecilie Kicks, NP and Arbie Cookey, CMA left message for pt to call back.

## 2017-09-24 NOTE — Telephone Encounter (Signed)
New Message        Patient returned call pls call again.

## 2017-09-24 NOTE — Telephone Encounter (Signed)
Cardiology [place] Note  Returned page from [place]. Patient found to have blood glucose level of 526. No acidemia or anion gap. Will cc ordering provider.   Marcie Mowers, MD Cardiology Fellow, PGY-6

## 2017-09-24 NOTE — Telephone Encounter (Signed)
-----   Message from Isaiah Serge, NP sent at 09/24/2017  8:06 AM EDT ----- Let pt know, labs show no fluid, will still need CXR,  Her glucose was very elevated at 526 -  She should see PCP for her diabetes.  This could be some of the visual issues.

## 2017-09-24 NOTE — Telephone Encounter (Signed)
[name] called back and has been notified of needing CXR, which she will have done at [facility] on Monday. [name] advised her glucose level was 526 which is too high and that she needs to f/u with PCP or Endocrinology. [name] advised if she is feeling worse over the weekend she should go to the ED. [name] is agreeable to plan of care. I will fax results to Dr. Chalmers Cater, Endocrinologist for the [name].

## 2017-09-27 ENCOUNTER — Ambulatory Visit (HOSPITAL_COMMUNITY)
Admission: RE | Admit: 2017-09-27 | Discharge: 2017-09-27 | Disposition: A | Discharge status: Home / Self Care | Payer: Medicare Other | Source: Ambulatory Visit | Attending: Cardiology | Admitting: Cardiology

## 2017-09-27 DIAGNOSIS — E1121 Type 2 diabetes mellitus with diabetic nephropathy: Secondary | ICD-10-CM | POA: Diagnosis not present

## 2017-09-27 DIAGNOSIS — R0602 Shortness of breath: Secondary | ICD-10-CM | POA: Diagnosis not present

## 2017-09-27 DIAGNOSIS — I10 Essential (primary) hypertension: Secondary | ICD-10-CM | POA: Diagnosis not present

## 2017-09-27 DIAGNOSIS — I2510 Atherosclerotic heart disease of native coronary artery without angina pectoris: Secondary | ICD-10-CM | POA: Diagnosis not present

## 2017-09-27 DIAGNOSIS — I129 Hypertensive chronic kidney disease with stage 1 through stage 4 chronic kidney disease, or unspecified chronic kidney disease: Secondary | ICD-10-CM | POA: Diagnosis not present

## 2017-09-28 NOTE — Telephone Encounter (Signed)
Returned pts call and left another message for pt to call back. 

## 2017-09-28 NOTE — Telephone Encounter (Signed)
-----   Message from Isaiah Serge, NP sent at 09/28/2017  9:19 AM EDT ----- Let pt know no fluid in lungs, is she still SOB?  If so , change the Brilinta to Effient.  She would need to complete Brilinta on one day and next AM take 60 mg Effient X 1 then beginning the next day and thereafter 10 mg daily

## 2017-09-28 NOTE — Telephone Encounter (Signed)
Follow Up:      Returning your call from today. 

## 2017-09-29 ENCOUNTER — Other Ambulatory Visit: Payer: Self-pay

## 2017-09-29 MED ORDER — PRASUGREL HCL 10 MG PO TABS: 10.0000 mg | ORAL_TABLET | Freq: Every day | ORAL | 3 refills | Status: DC

## 2017-09-29 NOTE — Telephone Encounter (Signed)
Follow up  ° ° °Patient is returning call.  °

## 2017-09-29 NOTE — Telephone Encounter (Signed)
Spoke with patient and she does have SOB when walking through the house, which is a new onset.  I informed her of Laura's recommendation and she agreed with the medication change.

## 2017-09-29 NOTE — Telephone Encounter (Signed)
lpmtcb 8/28

## 2017-09-29 NOTE — Progress Notes (Deleted)
Psychiatric Initial Adult Assessment   Patient Identification: [name] MRN:  [id number] Date of Evaluation:  09/29/2017 Referral Source: Dr. Sinda Du Chief Complaint:   Visit Diagnosis: No diagnosis found.  History of Present Illness:   [name] is a 63 y.o. year old female with a history of depression, hypertension, hypothyroidism, who is referred for    Associated Signs/Symptoms: Depression Symptoms:  {DEPRESSION SYMPTOMS:20000} (Hypo) Manic Symptoms:  {BHH MANIC SYMPTOMS:22872} Anxiety Symptoms:  {BHH ANXIETY SYMPTOMS:22873} Psychotic Symptoms:  {BHH PSYCHOTIC SYMPTOMS:22874} PTSD Symptoms: {BHH PTSD SYMPTOMS:22875}  Past Psychiatric History:  Outpatient:  Psychiatry admission:  Previous suicide attempt:  Past trials of medication:  History of violence:   Previous Psychotropic Medications: {YES/NO:21197}  Substance Abuse History in the last 12 months:  {yes no:314532}  Consequences of Substance Abuse: {BHH CONSEQUENCES OF SUBSTANCE ABUSE:22880}  Past Medical History:  Past Medical History:  Diagnosis Date  . Arthritis   . Asthma   . Chest pain   . Coronary artery disease    a. LAD PCI in 2005 b. cath in 2014 showed patent pLAD stent c. Cath 06/11/17 severe ISR of overlapping Cypher DES in pLAD s/p cutting ballon PTCA only; mild dLAD dz  . Diabetes mellitus   . Diabetic neuropathy ([place])   . GERD (gastroesophageal reflux disease)   . History of skin cancer   . Hypercholesteremia   . Hypertension   . Myocardial infarction ([place])   . Panic attack   . Sleep apnea   . Thyroid disease     Past Surgical History:  Procedure Laterality Date  . ABDOMINAL HYSTERECTOMY    . BACK SURGERY    . CHOLECYSTECTOMY    . CORONARY ANGIOPLASTY WITH STENT PLACEMENT    . CORONARY BALLOON ANGIOPLASTY N/A 06/11/2017   Procedure: CORONARY BALLOON ANGIOPLASTY;  Surgeon: Leonie Man, MD;  Location: [facility];  Service: Cardiovascular;  Laterality:  N/A;  LAD  . CORONARY STENT INTERVENTION  09/07/2017   STENT SYNERGY DES 3X24  . CORONARY STENT INTERVENTION N/A 09/07/2017   Procedure: CORONARY STENT INTERVENTION;  Surgeon: Jettie Booze, MD;  Location: [facility];  Service: Cardiovascular;  Laterality: N/A;  . ELBOW SURGERY    . KNEE SURGERY    . LEFT HEART CATH AND CORONARY ANGIOGRAPHY N/A 06/11/2017   Procedure: LEFT HEART CATH AND CORONARY ANGIOGRAPHY;  Surgeon: Leonie Man, MD;  Location: [facility];  Service: Cardiovascular;  Laterality: N/A;  . LEFT HEART CATH AND CORONARY ANGIOGRAPHY N/A 09/07/2017   Procedure: LEFT HEART CATH AND CORONARY ANGIOGRAPHY;  Surgeon: Jettie Booze, MD;  Location: [facility];  Service: Cardiovascular;  Laterality: N/A;  . LEFT HEART CATHETERIZATION WITH CORONARY ANGIOGRAM N/A 06/23/2012   Procedure: LEFT HEART CATHETERIZATION WITH CORONARY ANGIOGRAM;  Surgeon: Sinclair Grooms, MD;  Location: [facility];  Service: Cardiovascular;  Laterality: N/A;    Family Psychiatric History: ***  Family History:  Family History  Problem Relation Age of Onset  . COPD Mother   . Heart disease Father   . Heart attack Father     Social History:   Social History   Socioeconomic History  . Marital status: Divorced    Spouse name: Not on file  . Number of children: Not on file  . Years of education: Not on file  . Highest education level: Not on file  Occupational History  . Not on file  Social Needs  . Financial  resource strain: Not on file  . Food insecurity:    Worry: Not on file    Inability: Not on file  . Transportation needs:    Medical: Not on file    Non-medical: Not on file  Tobacco Use  . Smoking status: Never Smoker  . Smokeless tobacco: Never Used  Substance and Sexual Activity  . Alcohol use: No    Alcohol/week: 0.0 standard drinks  . Drug use: No  . Sexual activity: Not on file  Lifestyle  . Physical activity:    Days per week: Not on file     Minutes per session: Not on file  . Stress: Not on file  Relationships  . Social connections:    Talks on phone: Not on file    Gets together: Not on file    Attends religious service: Not on file    Active member of club or organization: Not on file    Attends meetings of clubs or organizations: Not on file    Relationship status: Not on file  Other Topics Concern  . Not on file  Social History Narrative  . Not on file    Additional Social History: ***  Allergies:   Allergies  Allergen Reactions  . Tape Other (See Comments)    Adhesive breaks me out (rash)  . Morphine Itching    Metabolic Disorder Labs: No results found for: HGBA1C, MPG No results found for: PROLACTIN No results found for: CHOL, TRIG, HDL, CHOLHDL, VLDL, LDLCALC   Current Medications: Current Outpatient Medications  Medication Sig Dispense Refill  . aspirin EC 81 MG tablet Take 81 mg by mouth daily.    . budesonide-formoterol (SYMBICORT) 160-4.5 MCG/ACT inhaler Inhale 2 puffs into the lungs 2 (two) times daily.    . Cholecalciferol (VITAMIN D3 SUPER STRENGTH) 2000 units TABS Take 2,000 Units by mouth daily.     . citalopram (CELEXA) 40 MG tablet Take 40 mg by mouth daily.  3  . cyclobenzaprine (FLEXERIL) 10 MG tablet Take 10 mg by mouth 3 (three) times daily as needed for muscle spasms.    . diazepam (VALIUM) 5 MG tablet Take 5 mg by mouth every 8 (eight) hours as needed for anxiety.    . diclofenac sodium (VOLTAREN) 1 % GEL APPLY TO AFFECTED AREA(S) THREE TIMES DAILY AS NEEDED  0  . furosemide (LASIX) 40 MG tablet Take 40 mg by mouth 2 (two) times daily.      Marland Kitchen gabapentin (NEURONTIN) 300 MG capsule Take 300 mg by mouth at bedtime.    . hydrOXYzine (ATARAX/VISTARIL) 25 MG tablet Take 25 mg by mouth 2 (two) times daily.   5  . insulin lispro protamine-lispro (HUMALOG 75/25) (75-25) 100 UNIT/ML SUSP Inject 70-75 Units into the skin 2 (two) times daily with a meal. Takes 75 units in am and 70 units in pm      . levothyroxine (SYNTHROID, LEVOTHROID) 112 MCG tablet Take 224 mcg by mouth daily before breakfast.     . losartan (COZAAR) 25 MG tablet Take 1 tablet (25 mg total) by mouth daily. 30 tablet 3  . Magnesium Oxide 400 (240 Mg) MG TABS Take 400 mg by mouth 2 (two) times daily.  5  . meclizine (ANTIVERT) 25 MG tablet Take 25 mg by mouth 4 (four) times daily as needed for dizziness.   5  . metFORMIN (GLUCOPHAGE) 500 MG tablet Take 500 mg by mouth 2 (two) times daily with a meal.     .  metoprolol succinate (TOPROL-XL) 25 MG 24 hr tablet Take 1 tablet (25 mg total) by mouth daily. 90 tablet 3  . mirtazapine (REMERON) 30 MG tablet Take 30 mg by mouth at bedtime.    . mupirocin ointment (BACTROBAN) 2 % Apply 1 application topically daily as needed for rash.  1  . nitroGLYCERIN (NITROSTAT) 0.4 MG SL tablet PLACE 1 TABLET (0.4 MG TOTAL) UNDER THE TONGUE EVERY 5 (FIVE) MINUTES AS NEEDED FOR CHEST PAIN. 25 tablet 3  . ondansetron (ZOFRAN) 4 MG tablet Take 4 mg by mouth 4 (four) times daily as needed for nausea or vomiting.   3  . oxyCODONE-acetaminophen (PERCOCET) 10-325 MG tablet Take 1 tablet by mouth every 4 (four) hours as needed for pain.    . pantoprazole (PROTONIX) 40 MG tablet TAKE 1 TABLET (40 MG TOTAL) BY MOUTH DAILY. PLEASE KEEP UPCOMING APPT FOR FUTURE REFILLS. THANK YOU 30 tablet 1  . PARoxetine (PAXIL) 20 MG tablet Take 20 mg by mouth daily.  3  . potassium chloride SA (K-DUR,KLOR-CON) 20 MEQ tablet Take 40 mEq by mouth daily as needed (leg cramps).     . prasugrel (EFFIENT) 10 MG TABS tablet Take 1 tablet (10 mg total) by mouth daily. 90 tablet 3  . rosuvastatin (CRESTOR) 40 MG tablet Take 40 mg by mouth daily.      . sertraline (ZOLOFT) 50 MG tablet Take 50 mg by mouth daily.  12  . tiotropium (SPIRIVA) 18 MCG inhalation capsule Place 18 mcg into inhaler and inhale daily as needed (asthma).     . tobramycin-dexamethasone (TOBRADEX) ophthalmic solution Place 1 drop into both eyes daily.       No current facility-administered medications for this visit.     Neurologic: Headache: No Seizure: No Paresthesias:No  Musculoskeletal: Strength & Muscle Tone: within normal limits Gait & Station: normal Patient leans: N/A  Psychiatric Specialty Exam: ROS  There were no vitals taken for this visit.There is no height or weight on file to calculate BMI.  General Appearance: Fairly Groomed  Eye Contact:  Good  Speech:  Clear and Coherent  Volume:  Normal  Mood:  {BHH MOOD:22306}  Affect:  {Affect (PAA):22687}  Thought Process:  Coherent  Orientation:  Full (Time, Place, and Person)  Thought Content:  Logical  Suicidal Thoughts:  {ST/HT (PAA):22692}  Homicidal Thoughts:  {ST/HT (PAA):22692}  Memory:  Immediate;   Good  Judgement:  {Judgement (PAA):22694}  Insight:  {Insight (PAA):22695}  Psychomotor Activity:  Normal  Concentration:  Concentration: Good and Attention Span: Good  Recall:  Good  Fund of Knowledge:Good  Language: Good  Akathisia:  No  Handed:  Right  AIMS (if indicated):  N/A  Assets:  Communication Skills Desire for Improvement  ADL's:  Intact  Cognition: WNL  Sleep:  ***   Assessment  Plan  The patient demonstrates the following risk factors for suicide: Chronic risk factors for suicide include: {Chronic Risk Factors for UJWJXBJ:47829562}. Acute risk factors for suicide include: {Acute Risk Factors for ZHYQMVH:84696295}. Protective factors for this patient include: {Protective Factors for Suicide MWUX:32440102}. Considering these factors, the overall suicide risk at this point appears to be {Desc; low/moderate/high:110033}. Patient {ACTION; IS/IS VOZ:36644034} appropriate for outpatient follow up.   Treatment Plan Summary: Plan as above   Norman Clay, MD 8/28/20193:27 PM

## 2017-09-30 ENCOUNTER — Encounter: Payer: Self-pay | Admitting: Cardiology

## 2017-09-30 NOTE — Telephone Encounter (Signed)
Called pt re: Effient.  Left a message for her to call back. I was looking and I don't see where pt was instructed to dose the 1st dose with 60 mg. Please advise!

## 2017-09-30 NOTE — Telephone Encounter (Signed)
Just make sure she takes the 60 mg for loading dose of Effient.

## 2017-09-30 NOTE — Telephone Encounter (Signed)
Great thank you.  If symptoms continue please ask her to call us.

## 2017-09-30 NOTE — Telephone Encounter (Signed)
Great. Thank you.

## 2017-09-30 NOTE — Telephone Encounter (Signed)
Spoke with patient on 8/28 and informed her of Effient medication instructions, which include:  Take 60 mg (6 tablets) the first morning and then 10 mg thereafter daily.  The patient verbalized understanding.

## 2017-10-01 ENCOUNTER — Ambulatory Visit (HOSPITAL_COMMUNITY): Payer: Medicare Other | Admitting: Psychiatry

## 2017-10-06 DIAGNOSIS — H5213 Myopia, bilateral: Secondary | ICD-10-CM | POA: Diagnosis not present

## 2017-10-11 ENCOUNTER — Ambulatory Visit: Payer: Medicare Other | Admitting: Cardiology

## 2017-10-13 ENCOUNTER — Other Ambulatory Visit: Payer: Self-pay | Admitting: Physician Assistant

## 2017-10-14 DIAGNOSIS — I10 Essential (primary) hypertension: Secondary | ICD-10-CM | POA: Diagnosis not present

## 2017-10-14 DIAGNOSIS — E7800 Pure hypercholesterolemia, unspecified: Secondary | ICD-10-CM | POA: Diagnosis not present

## 2017-10-14 DIAGNOSIS — E1165 Type 2 diabetes mellitus with hyperglycemia: Secondary | ICD-10-CM | POA: Diagnosis not present

## 2017-10-14 DIAGNOSIS — E039 Hypothyroidism, unspecified: Secondary | ICD-10-CM | POA: Diagnosis not present

## 2017-10-22 ENCOUNTER — Other Ambulatory Visit: Payer: Self-pay | Admitting: Physician Assistant
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
## 2017-11-09 ENCOUNTER — Ambulatory Visit (INDEPENDENT_AMBULATORY_CARE_PROVIDER_SITE_OTHER): Payer: Medicare Other | Admitting: Psychiatry

## 2017-11-09 ENCOUNTER — Encounter (INDEPENDENT_AMBULATORY_CARE_PROVIDER_SITE_OTHER): Payer: Self-pay

## 2017-11-09 VITALS — BP 110/80 | HR 80 | Ht 64.0 in | Wt 212.0 lb

## 2017-11-09 DIAGNOSIS — G4700 Insomnia, unspecified: Secondary | ICD-10-CM | POA: Diagnosis not present

## 2017-11-09 DIAGNOSIS — F419 Anxiety disorder, unspecified: Secondary | ICD-10-CM | POA: Diagnosis not present

## 2017-11-09 DIAGNOSIS — F331 Major depressive disorder, recurrent, moderate: Secondary | ICD-10-CM | POA: Insufficient documentation

## 2017-11-09 MED ORDER — MIRTAZAPINE 30 MG PO TABS: 30.0000 mg | ORAL_TABLET | Freq: Every day | ORAL | 0 refills | Status: DC

## 2017-11-09 MED ORDER — DULOXETINE HCL 20 MG PO CPEP: ORAL_CAPSULE | ORAL | 0 refills | Status: DC

## 2017-11-09 NOTE — Patient Instructions (Signed)
1. Decrease Paxil 10 mg daily for one week, then discontinue  2. Start duloxetine 20 mg daily for one week, then 40 mg daily  3. Continue mirtazapine 30 mg at night  4. Return to clinic in one month for 30 mins 5. Referral to therapy

## 2017-11-19 ENCOUNTER — Encounter: Payer: Self-pay | Admitting: Cardiology

## 2017-11-19 ENCOUNTER — Ambulatory Visit (INDEPENDENT_AMBULATORY_CARE_PROVIDER_SITE_OTHER): Payer: Medicare Other | Admitting: Cardiology

## 2017-11-19 VITALS — Ht 64.0 in | Wt 210.0 lb

## 2017-11-19 DIAGNOSIS — Z95820 Peripheral vascular angioplasty status with implants and grafts: Secondary | ICD-10-CM | POA: Diagnosis not present

## 2017-11-19 DIAGNOSIS — E782 Mixed hyperlipidemia: Secondary | ICD-10-CM | POA: Diagnosis not present

## 2017-11-19 DIAGNOSIS — R079 Chest pain, unspecified: Secondary | ICD-10-CM

## 2017-11-19 DIAGNOSIS — I2510 Atherosclerotic heart disease of native coronary artery without angina pectoris: Secondary | ICD-10-CM

## 2017-11-19 DIAGNOSIS — I10 Essential (primary) hypertension: Secondary | ICD-10-CM

## 2017-11-19 DIAGNOSIS — I200 Unstable angina: Secondary | ICD-10-CM | POA: Diagnosis not present

## 2017-11-19 DIAGNOSIS — I2583 Coronary atherosclerosis due to lipid rich plaque: Secondary | ICD-10-CM

## 2017-11-19 NOTE — Progress Notes (Signed)
Cardiology Office Note   Date:  11/19/2017   ID:  [name], DOB January 03, 1956, MRN [id number]  PCP:  Sinda Du, MD  Cardiologist:  Dr. Tamala Julian    Chief Complaint  Patient presents with  . Chest Pain      History of Present Illness: [name] is a 62 y.o. female who presents for follow up for CAD.   with stent placed to Ost to Prox LAD for 90% stenosis.  DES this was at site of prior PTCA.   Non Responder to Plavix.  Now on Brilinta  She has a hx of asthma, DM-2, GERD, HLD, panic attacks, sleep apnea, thyroid disease and  Hx of CAD with PCI to LAD in 2005 And in 06/2017 she had admit wityh chest pain and near syncope and under went cardiac cath and had severe ISR of overlapping cyper DES in p-mLAD and underwent scoring balloon angioplasty and PTCA. She also had RBBB on EKG that was new. Also with orthostatic hypotension. she had cardiac cath and had restenosis of LAD and underwent stent placed to Ost to Prox LAD for 90% stenosis.  DES this was at site of prior PTCA.    09/23/17 she still has some chest pain, one spot on Lt ant chest and is tender to palpation.  She is having eye blurring and SOB.     Also headache in back of her head.  She is worried the site may be closing up.  We discussed now with stent less likely this will happen.  Her symptoms are similar to when she left the hospital.  Also BP at home is very high at times. We stopped the imdur , CXR was normal and labs were stable including of 24.  Discussed with Dr. Tamala Julian and we chanaged Brilinta to Effient after load with effient.  Today she tells me she is having the same symptoms again.  Black spots with she walks and chest pain.  It does waken her from sleep at night.,  She takes NTG with relief.   She has been taking the effient.    She is dyspneic with the pain as well.   On last visit her glucose was 500 - she has seen Dr. Bubba Camp back and her meds have been adjusted.   Her glucose is labile  at times.  No syncope.     Past Medical History:  Diagnosis Date  . Arthritis   . Asthma   . Chest pain   . Coronary artery disease    a. LAD PCI in 2005 b. cath in 2014 showed patent pLAD stent c. Cath 06/11/17 severe ISR of overlapping Cypher DES in pLAD s/p cutting ballon PTCA only; mild dLAD dz  . Diabetes mellitus   . Diabetic neuropathy ([place])   . GERD (gastroesophageal reflux disease)   . History of skin cancer   . Hypercholesteremia   . Hypertension   . Myocardial infarction ([place])   . Panic attack   . Sleep apnea   . Thyroid disease     Past Surgical History:  Procedure Laterality Date  . ABDOMINAL HYSTERECTOMY    . BACK SURGERY    . CHOLECYSTECTOMY    . CORONARY ANGIOPLASTY WITH STENT PLACEMENT    . CORONARY BALLOON ANGIOPLASTY N/A 06/11/2017   Procedure: CORONARY BALLOON ANGIOPLASTY;  Surgeon: Leonie Man, MD;  Location: [facility];  Service: Cardiovascular;  Laterality: N/A;  LAD  . CORONARY STENT INTERVENTION  09/07/2017  STENT SYNERGY DES [id number]  . CORONARY STENT INTERVENTION N/A 09/07/2017   Procedure: CORONARY STENT INTERVENTION;  Surgeon: Jettie Booze, MD;  Location: [facility];  Service: Cardiovascular;  Laterality: N/A;  . ELBOW SURGERY    . KNEE SURGERY    . LEFT HEART CATH AND CORONARY ANGIOGRAPHY N/A 06/11/2017   Procedure: LEFT HEART CATH AND CORONARY ANGIOGRAPHY;  Surgeon: Leonie Man, MD;  Location: [facility];  Service: Cardiovascular;  Laterality: N/A;  . LEFT HEART CATH AND CORONARY ANGIOGRAPHY N/A 09/07/2017   Procedure: LEFT HEART CATH AND CORONARY ANGIOGRAPHY;  Surgeon: Jettie Booze, MD;  Location: [facility];  Service: Cardiovascular;  Laterality: N/A;  . LEFT HEART CATHETERIZATION WITH CORONARY ANGIOGRAM N/A 06/23/2012   Procedure: LEFT HEART CATHETERIZATION WITH CORONARY ANGIOGRAM;  Surgeon: Sinclair Grooms, MD;  Location: [facility];  Service: Cardiovascular;  Laterality: N/A;     Current  Outpatient Medications  Medication Sig Dispense Refill  . aspirin EC 81 MG tablet Take 81 mg by mouth daily.    . budesonide-formoterol (SYMBICORT) 160-4.5 MCG/ACT inhaler Inhale 2 puffs into the lungs 2 (two) times daily.    . Cholecalciferol (VITAMIN D3 SUPER STRENGTH) 2000 units TABS Take 2,000 Units by mouth daily.     . cyclobenzaprine (FLEXERIL) 10 MG tablet Take 10 mg by mouth 3 (three) times daily as needed for muscle spasms.    . diazepam (VALIUM) 5 MG tablet Take 5 mg by mouth every 8 (eight) hours as needed for anxiety.    . diclofenac sodium (VOLTAREN) 1 % GEL APPLY TO AFFECTED AREA(S) THREE TIMES DAILY AS NEEDED  0  . DULoxetine (CYMBALTA) 20 MG capsule Start 20 mg daily for one week, then 40 mg daily 60 capsule 0  . furosemide (LASIX) 40 MG tablet Take 40 mg by mouth 2 (two) times daily.      . hydrOXYzine (ATARAX/VISTARIL) 25 MG tablet Take 25 mg by mouth 2 (two) times daily.   5  . insulin degludec (TRESIBA FLEXTOUCH) 100 UNIT/ML SOPN FlexTouch Pen Inject 50 Units into the skin daily.    . insulin lispro (HUMALOG) 100 UNIT/ML injection Inject 10-20 Units into the skin 3 (three) times daily before meals.    Marland Kitchen levothyroxine (SYNTHROID, LEVOTHROID) 112 MCG tablet Take 224 mcg by mouth daily before breakfast.     . losartan (COZAAR) 25 MG tablet TAKE 1 TABLET BY MOUTH EVERY DAY 90 tablet 3  . Magnesium Oxide 400 (240 Mg) MG TABS Take 400 mg by mouth 2 (two) times daily.  5  . meclizine (ANTIVERT) 25 MG tablet Take 25 mg by mouth 4 (four) times daily as needed for dizziness.   5  . metoprolol succinate (TOPROL-XL) 25 MG 24 hr tablet Take 1 tablet (25 mg total) by mouth daily. 90 tablet 3  . mirtazapine (REMERON) 30 MG tablet Take 1 tablet (30 mg total) by mouth at bedtime. 30 tablet 0  . mupirocin ointment (BACTROBAN) 2 % Apply 1 application topically daily as needed for rash.  1  . nitroGLYCERIN (NITROSTAT) 0.4 MG SL tablet PLACE 1 TABLET (0.4 MG TOTAL) UNDER THE TONGUE EVERY 5 (FIVE)  MINUTES AS NEEDED FOR CHEST PAIN. 25 tablet 3  . ondansetron (ZOFRAN) 4 MG tablet Take 4 mg by mouth 4 (four) times daily as needed for nausea or vomiting.   3  . oxyCODONE-acetaminophen (PERCOCET) 10-325 MG tablet Take 1 tablet by mouth every 4 (four) hours  as needed for pain.    . pantoprazole (PROTONIX) 40 MG tablet Take 1 tablet (40 mg total) by mouth daily. 90 tablet 3  . potassium chloride SA (K-DUR,KLOR-CON) 20 MEQ tablet Take 40 mEq by mouth daily as needed (leg cramps).     . prasugrel (EFFIENT) 10 MG TABS tablet Take 1 tablet (10 mg total) by mouth daily. 90 tablet 3  . rosuvastatin (CRESTOR) 40 MG tablet Take 40 mg by mouth daily.      Marland Kitchen tiotropium (SPIRIVA) 18 MCG inhalation capsule Place 18 mcg into inhaler and inhale daily as needed (asthma).     . tobramycin-dexamethasone (TOBRADEX) ophthalmic solution Place 1 drop into both eyes daily.      No current facility-administered medications for this visit.     Allergies:   Tape and Morphine    Social History:  The patient  reports that she has never smoked. She has never used smokeless tobacco. She reports that she does not drink alcohol or use drugs.   Family History:  The patient's family history includes COPD in her mother; Heart attack in her father; Heart disease in her father.    ROS:  General:no colds or fevers, + weight increase Skin:no rashes or ulcers HEENT:no blurred vision, no congestion CV:see HPI PUL:see HPI GI:no diarrhea constipation or melena, no indigestion GU:no hematuria, no dysuria MS:no joint pain, no claudication Neuro:no syncope, no lightheadedness Endo:+ diabetes with elevated glucose after UTI, + thyroid disease  Wt Readings from Last 3 Encounters:  11/19/17 210 lb (95.3 kg)  09/23/17 200 lb (90.7 kg)  09/09/17 201 lb 11.5 oz (91.5 kg)     PHYSICAL EXAM: VS:  Ht 5\' 4"  (1.626 m)   Wt 210 lb (95.3 kg)   SpO2 97%   BMI 36.05 kg/m  , BMI Body mass index is 36.05 kg/m. General:Pleasant  affect, NAD Skin:Warm and dry, brisk capillary refill HEENT:normocephalic, sclera clear, mucus membranes moist Neck:supple, no JVD, no bruits  Heart:S1S2 RRR without murmur, gallup, rub or click Lungs:clear without rales, rhonchi, or wheezes PIR:JJOA, non tender, + BS, do not palpate liver spleen or masses Ext:no lower ext edema, 2+ pedal pulses, 2+ radial pulses Neuro:alert and oriented, MAE, follows commands, + facial symmetry    EKG:  EKG is ordered today. The ekg ordered today demonstrates SR with RBBB and LAFB    Recent Labs: 06/10/2017: Magnesium 1.5; TSH 0.057 06/11/2017: ALT 27 09/23/2017: BUN 20; Creatinine, Ser 1.43; Hemoglobin 12.8; NT-Pro BNP 24; Platelets 181; Potassium 4.9; Sodium 131    Lipid Panel No results found for: CHOL, TRIG, HDL, CHOLHDL, VLDL, LDLCALC, LDLDIRECT     Other studies Reviewed: Additional studies/ records that were reviewed today include:   Cardiac cath 09/07/17 .  Mid LAD to Dist LAD lesion is 25% stenosed.  Ost LAD lesion is 25% stenosed.  Ost LAD to Prox LAD lesion is 90% stenosed.  A drug-eluting stent was successfully placed using a STENT SYNERGY DES 3X24, postdilated to 3.8 mm. Prior [facility] PTCA was with 3.5 mm balloon.  Post intervention, there is a 0% residual stenosis.  The left ventricular ejection fraction is 50-55% by visual estimate.  The left ventricular systolic function is normal.  LV end diastolic pressure is mildly elevated.  There is no aortic valve stenosis.     Recommend uninterrupted dual antiplatelet therapy with Aspirin 81mg  daily and Clopidogrel 75mg  daily for a minimum of 12 months (ACS - Class I recommendation).    Echo 06/12/17  Study  Conclusions  - Left ventricle: The cavity size was normal. There was mild focal   basal hypertrophy of the septum. Systolic function was normal.   The estimated ejection fraction was in the range of 60% to 65%.   Wall motion was normal; there were no regional  wall motion   abnormalities. Doppler parameters are consistent with abnormal   left ventricular relaxation (grade 1 diastolic dysfunction).   There was no evidence of elevated ventricular filling pressure by   Doppler parameters. - Mitral valve: There was mild regurgitation. - Right ventricle: The cavity size was normal. Wall thickness was   normal. Systolic function was normal. - Tricuspid valve: There was mild regurgitation. - Pulmonary arteries: Systolic pressure was mildly increased. PA   peak pressure: 35 mm Hg (S). - Inferior vena cava: The vessel was normal in size. - Pericardium, extracardiac: There was no pericardial effusion.  ASSESSMENT AND PLAN:  1.  Recurrence of unstable angina.  Symptoms are same as prior.  Discussed with Dr. Tamala Julian will need cardiac cath to eval.  She is intolerant to Brilinta with dyspnea.  She is a non responder to effient.  If re-occlusion thought would be LIMA graft.  I discussed with pt.    The patient understands that risks included but are not limited to stroke (1 in 1000), death (1 in 52), kidney failure [usually temporary] (1 in 500), bleeding (1 in 200), allergic reaction [possibly serious] (1 in 200).   Pt is aware to go to ER if symptoms increase.   2.  CAD with stent placed 06/2003 DES, prox. In 06/2017 she had angina and underwent cath with severe ISR of overlapping cyper DES in p-m LAD, underwent cutting balloon PTCA and post dilation. Her diffuse m-d LAD disease has no change.   In 09/2017 recurrent angina and cath revealed restenosis of prior cutting balloon PTCA site.  She underwent stent DES to the area that was successful.  She was found to be plavix nonresponder.  Placed on [place].  Then developed dyspnea with nomral BNP and clear CXR so Brilinta stopped and placed on effient with load.    3.  DM-2 with recent elevated glucose.  Her metformin has been stoped and her insulin changed.   4.  HLD on  crestor   5.  osa on cpap  6. GERD     7 RBBB since May 2019.  8.  Orthostatic hypotension mild with dizziness in office.     Current medicines are reviewed with the patient today.  The patient Has no concerns regarding medicines.  The following changes have been made:  See above Labs/ tests ordered today include:see above  Disposition:   FU:  see above  Signed, Cecilie Kicks, NP  11/19/2017 4:33 PM    [facility] [place], [place], [address] [place], [place] Phone: [phone]; Fax: [phone]

## 2017-11-19 NOTE — H&P (View-Only) (Signed)
Cardiology Office Note   Date:  11/19/2017   ID:  [name], DOB [birth year], MRN [id number]  PCP:  Sinda Du, MD  Cardiologist:  Dr. Tamala Julian    Chief Complaint  Patient presents with  . Chest Pain      History of Present Illness: [name] is a 62 y.o. female who presents for follow up for CAD.   with stent placed to Ost to Prox LAD for 90% stenosis.  DES this was at site of prior PTCA.   Non Responder to Plavix.  Now on Brilinta  She has a hx of asthma, DM-2, GERD, HLD, panic attacks, sleep apnea, thyroid disease and  Hx of CAD with PCI to LAD in 2005 And in 06/2017 she had admit wityh chest pain and near syncope and under went cardiac cath and had severe ISR of overlapping cyper DES in p-mLAD and underwent scoring balloon angioplasty and PTCA. She also had RBBB on EKG that was new. Also with orthostatic hypotension. she had cardiac cath and had restenosis of LAD and underwent stent placed to Ost to Prox LAD for 90% stenosis.  DES this was at site of prior PTCA.    09/23/17 she still has some chest pain, one spot on Lt ant chest and is tender to palpation.  She is having eye blurring and SOB.     Also headache in back of her head.  She is worried the site may be closing up.  We discussed now with stent less likely this will happen.  Her symptoms are similar to when she left the hospital.  Also BP at home is very high at times. We stopped the imdur , CXR was normal and labs were stable including of 24.  Discussed with Dr. Tamala Julian and we chanaged Brilinta to Effient after load with effient.  Today she tells me she is having the same symptoms again.  Black spots with she walks and chest pain.  It does waken her from sleep at night.,  She takes NTG with relief.   She has been taking the effient.    She is dyspneic with the pain as well.   On last visit her glucose was 500 - she has seen Dr. Bubba Camp back and her meds have been adjusted.   Her glucose is labile  at times.  No syncope.     Past Medical History:  Diagnosis Date  . Arthritis   . Asthma   . Chest pain   . Coronary artery disease    a. LAD PCI in 2005 b. cath in 2014 showed patent pLAD stent c. Cath 06/11/17 severe ISR of overlapping Cypher DES in pLAD s/p cutting ballon PTCA only; mild dLAD dz  . Diabetes mellitus   . Diabetic neuropathy ([place])   . GERD (gastroesophageal reflux disease)   . History of skin cancer   . Hypercholesteremia   . Hypertension   . Myocardial infarction ([place])   . Panic attack   . Sleep apnea   . Thyroid disease     Past Surgical History:  Procedure Laterality Date  . ABDOMINAL HYSTERECTOMY    . BACK SURGERY    . CHOLECYSTECTOMY    . CORONARY ANGIOPLASTY WITH STENT PLACEMENT    . CORONARY BALLOON ANGIOPLASTY N/A 06/11/2017   Procedure: CORONARY BALLOON ANGIOPLASTY;  Surgeon: Leonie Man, MD;  Location: [facility];  Service: Cardiovascular;  Laterality: N/A;  LAD  . CORONARY STENT INTERVENTION  09/07/2017  STENT SYNERGY DES [id number]  . CORONARY STENT INTERVENTION N/A 09/07/2017   Procedure: CORONARY STENT INTERVENTION;  Surgeon: Jettie Booze, MD;  Location: [facility];  Service: Cardiovascular;  Laterality: N/A;  . ELBOW SURGERY    . KNEE SURGERY    . LEFT HEART CATH AND CORONARY ANGIOGRAPHY N/A 06/11/2017   Procedure: LEFT HEART CATH AND CORONARY ANGIOGRAPHY;  Surgeon: Leonie Man, MD;  Location: [facility];  Service: Cardiovascular;  Laterality: N/A;  . LEFT HEART CATH AND CORONARY ANGIOGRAPHY N/A 09/07/2017   Procedure: LEFT HEART CATH AND CORONARY ANGIOGRAPHY;  Surgeon: Jettie Booze, MD;  Location: [facility];  Service: Cardiovascular;  Laterality: N/A;  . LEFT HEART CATHETERIZATION WITH CORONARY ANGIOGRAM N/A 06/23/2012   Procedure: LEFT HEART CATHETERIZATION WITH CORONARY ANGIOGRAM;  Surgeon: Sinclair Grooms, MD;  Location: [facility];  Service: Cardiovascular;  Laterality: N/A;     Current  Outpatient Medications  Medication Sig Dispense Refill  . aspirin EC 81 MG tablet Take 81 mg by mouth daily.    . budesonide-formoterol (SYMBICORT) 160-4.5 MCG/ACT inhaler Inhale 2 puffs into the lungs 2 (two) times daily.    . Cholecalciferol (VITAMIN D3 SUPER STRENGTH) 2000 units TABS Take 2,000 Units by mouth daily.     . cyclobenzaprine (FLEXERIL) 10 MG tablet Take 10 mg by mouth 3 (three) times daily as needed for muscle spasms.    . diazepam (VALIUM) 5 MG tablet Take 5 mg by mouth every 8 (eight) hours as needed for anxiety.    . diclofenac sodium (VOLTAREN) 1 % GEL APPLY TO AFFECTED AREA(S) THREE TIMES DAILY AS NEEDED  0  . DULoxetine (CYMBALTA) 20 MG capsule Start 20 mg daily for one week, then 40 mg daily 60 capsule 0  . furosemide (LASIX) 40 MG tablet Take 40 mg by mouth 2 (two) times daily.      . hydrOXYzine (ATARAX/VISTARIL) 25 MG tablet Take 25 mg by mouth 2 (two) times daily.   5  . insulin degludec (TRESIBA FLEXTOUCH) 100 UNIT/ML SOPN FlexTouch Pen Inject 50 Units into the skin daily.    . insulin lispro (HUMALOG) 100 UNIT/ML injection Inject 10-20 Units into the skin 3 (three) times daily before meals.    Marland Kitchen levothyroxine (SYNTHROID, LEVOTHROID) 112 MCG tablet Take 224 mcg by mouth daily before breakfast.     . losartan (COZAAR) 25 MG tablet TAKE 1 TABLET BY MOUTH EVERY DAY 90 tablet 3  . Magnesium Oxide 400 (240 Mg) MG TABS Take 400 mg by mouth 2 (two) times daily.  5  . meclizine (ANTIVERT) 25 MG tablet Take 25 mg by mouth 4 (four) times daily as needed for dizziness.   5  . metoprolol succinate (TOPROL-XL) 25 MG 24 hr tablet Take 1 tablet (25 mg total) by mouth daily. 90 tablet 3  . mirtazapine (REMERON) 30 MG tablet Take 1 tablet (30 mg total) by mouth at bedtime. 30 tablet 0  . mupirocin ointment (BACTROBAN) 2 % Apply 1 application topically daily as needed for rash.  1  . nitroGLYCERIN (NITROSTAT) 0.4 MG SL tablet PLACE 1 TABLET (0.4 MG TOTAL) UNDER THE TONGUE EVERY 5 (FIVE)  MINUTES AS NEEDED FOR CHEST PAIN. 25 tablet 3  . ondansetron (ZOFRAN) 4 MG tablet Take 4 mg by mouth 4 (four) times daily as needed for nausea or vomiting.   3  . oxyCODONE-acetaminophen (PERCOCET) 10-325 MG tablet Take 1 tablet by mouth every 4 (four) hours  as needed for pain.    . pantoprazole (PROTONIX) 40 MG tablet Take 1 tablet (40 mg total) by mouth daily. 90 tablet 3  . potassium chloride SA (K-DUR,KLOR-CON) 20 MEQ tablet Take 40 mEq by mouth daily as needed (leg cramps).     . prasugrel (EFFIENT) 10 MG TABS tablet Take 1 tablet (10 mg total) by mouth daily. 90 tablet 3  . rosuvastatin (CRESTOR) 40 MG tablet Take 40 mg by mouth daily.      Marland Kitchen tiotropium (SPIRIVA) 18 MCG inhalation capsule Place 18 mcg into inhaler and inhale daily as needed (asthma).     . tobramycin-dexamethasone (TOBRADEX) ophthalmic solution Place 1 drop into both eyes daily.      No current facility-administered medications for this visit.     Allergies:   Tape and Morphine    Social History:  The patient  reports that she has never smoked. She has never used smokeless tobacco. She reports that she does not drink alcohol or use drugs.   Family History:  The patient's family history includes COPD in her mother; Heart attack in her father; Heart disease in her father.    ROS:  General:no colds or fevers, + weight increase Skin:no rashes or ulcers HEENT:no blurred vision, no congestion CV:see HPI PUL:see HPI GI:no diarrhea constipation or melena, no indigestion GU:no hematuria, no dysuria MS:no joint pain, no claudication Neuro:no syncope, no lightheadedness Endo:+ diabetes with elevated glucose after UTI, + thyroid disease  Wt Readings from Last 3 Encounters:  11/19/17 210 lb (95.3 kg)  09/23/17 200 lb (90.7 kg)  09/09/17 201 lb 11.5 oz (91.5 kg)     PHYSICAL EXAM: VS:  Ht 5\' 4"  (1.626 m)   Wt 210 lb (95.3 kg)   SpO2 97%   BMI 36.05 kg/m  , BMI Body mass index is 36.05 kg/m. General:Pleasant  affect, NAD Skin:Warm and dry, brisk capillary refill HEENT:normocephalic, sclera clear, mucus membranes moist Neck:supple, no JVD, no bruits  Heart:S1S2 RRR without murmur, gallup, rub or click Lungs:clear without rales, rhonchi, or wheezes WUJ:WJXB, non tender, + BS, do not palpate liver spleen or masses Ext:no lower ext edema, 2+ pedal pulses, 2+ radial pulses Neuro:alert and oriented, MAE, follows commands, + facial symmetry    EKG:  EKG is ordered today. The ekg ordered today demonstrates SR with RBBB and LAFB    Recent Labs: 06/10/2017: Magnesium 1.5; TSH 0.057 06/11/2017: ALT 27 09/23/2017: BUN 20; Creatinine, Ser 1.43; Hemoglobin 12.8; NT-Pro BNP 24; Platelets 181; Potassium 4.9; Sodium 131    Lipid Panel No results found for: CHOL, TRIG, HDL, CHOLHDL, VLDL, LDLCALC, LDLDIRECT     Other studies Reviewed: Additional studies/ records that were reviewed today include:   Cardiac cath 09/07/17 .  Mid LAD to Dist LAD lesion is 25% stenosed.  Ost LAD lesion is 25% stenosed.  Ost LAD to Prox LAD lesion is 90% stenosed.  A drug-eluting stent was successfully placed using a STENT SYNERGY DES 3X24, postdilated to 3.8 mm. Prior [facility] PTCA was with 3.5 mm balloon.  Post intervention, there is a 0% residual stenosis.  The left ventricular ejection fraction is 50-55% by visual estimate.  The left ventricular systolic function is normal.  LV end diastolic pressure is mildly elevated.  There is no aortic valve stenosis.     Recommend uninterrupted dual antiplatelet therapy with Aspirin 81mg  daily and Clopidogrel 75mg  daily for a minimum of 12 months (ACS - Class I recommendation).    Echo 06/12/17  Study  Conclusions  - Left ventricle: The cavity size was normal. There was mild focal   basal hypertrophy of the septum. Systolic function was normal.   The estimated ejection fraction was in the range of 60% to 65%.   Wall motion was normal; there were no regional  wall motion   abnormalities. Doppler parameters are consistent with abnormal   left ventricular relaxation (grade 1 diastolic dysfunction).   There was no evidence of elevated ventricular filling pressure by   Doppler parameters. - Mitral valve: There was mild regurgitation. - Right ventricle: The cavity size was normal. Wall thickness was   normal. Systolic function was normal. - Tricuspid valve: There was mild regurgitation. - Pulmonary arteries: Systolic pressure was mildly increased. PA   peak pressure: 35 mm Hg (S). - Inferior vena cava: The vessel was normal in size. - Pericardium, extracardiac: There was no pericardial effusion.  ASSESSMENT AND PLAN:  1.  Recurrence of unstable angina.  Symptoms are same as prior.  Discussed with Dr. Tamala Julian will need cardiac cath to eval.  She is intolerant to Brilinta with dyspnea.  She is a non responder to effient.  If re-occlusion thought would be LIMA graft.  I discussed with pt.    The patient understands that risks included but are not limited to stroke (1 in 1000), death (1 in 1), kidney failure [usually temporary] (1 in 500), bleeding (1 in 200), allergic reaction [possibly serious] (1 in 200).   Pt is aware to go to ER if symptoms increase.   2.  CAD with stent placed 06/2003 DES, prox. In 06/2017 she had angina and underwent cath with severe ISR of overlapping cyper DES in p-m LAD, underwent cutting balloon PTCA and post dilation. Her diffuse m-d LAD disease has no change.   In 09/2017 recurrent angina and cath revealed restenosis of prior cutting balloon PTCA site.  She underwent stent DES to the area that was successful.  She was found to be plavix nonresponder.  Placed on Regino Ramirez.  Then developed dyspnea with nomral BNP and clear CXR so Brilinta stopped and placed on effient with load.    3.  DM-2 with recent elevated glucose.  Her metformin has been stoped and her insulin changed.   4.  HLD on  crestor   5.  osa on cpap  6. GERD     7 RBBB since May 2019.  8.  Orthostatic hypotension mild with dizziness in office.     Current medicines are reviewed with the patient today.  The patient Has no concerns regarding medicines.  The following changes have been made:  See above Labs/ tests ordered today include:see above  Disposition:   FU:  see above  Signed, Cecilie Kicks, NP  11/19/2017 4:33 PM    [facility] [place], [place], [place], [place] Phone: [phone]; Fax: [phone]

## 2017-11-19 NOTE — Patient Instructions (Addendum)
Medication Instructions:  Your physician recommends that you continue on your current medications as directed. Please refer to the Current Medication list given to you today. \ If you need a refill on your cardiac medications before your next appointment, please call your pharmacy.   Lab work: TODAY:  BMET & CBC  If you have labs (blood work) drawn today and your tests are completely normal, you will receive your results only by: Marland Kitchen MyChart Message (if you have MyChart) OR . A paper copy in the mail If you have any lab test that is abnormal or we need to change your treatment, we will call you to review the results.  Testing/Procedures: Your physician has requested that you have a cardiac catheterization. Cardiac catheterization is used to diagnose and/or treat various heart conditions. Doctors may recommend this procedure for a number of different reasons. The most common reason is to evaluate chest pain. Chest pain can be a symptom of coronary artery disease (CAD), and cardiac catheterization can show whether plaque is narrowing or blocking your heart's arteries. This procedure is also used to evaluate the valves, as well as measure the blood flow and oxygen levels in different parts of your heart. For further information please visit [email or web address]. Please follow instruction sheet, as given.    Follow-Up: At [facility], you and your health needs are our priority.  As part of our continuing mission to provide you with exceptional heart care, we have created designated Provider Care Teams.  These Care Teams include your primary Cardiologist (physician) and Advanced Practice Providers (APPs -  Physician Assistants and Nurse Practitioners) who all work together to provide you with the care you need, when you need it. You will need a follow up appointment in 2 WEEKS AFTER 11/26/17 [place] OR A MEMBER OF HIS CARETEAM.  Any Other Special Instructions Will Be Listed Below (If  Applicable). ,   [place] OFFICE [place], [address] [place] [address] Dept: [phone] Loc: [phone]  NATEISHA MOYD  11/19/2017  You are scheduled for a Cardiac Catheterization on Friday, October 25 with Dr. Daneen Schick.  1. Please arrive at the [facility] (Main Entrance A) at [facility]: [address] [place], [place] [address] at 5:30 AM (This time is two hours before your procedure to ensure your preparation). Free valet parking service is available.   Special note: Every effort is made to have your procedure done on time. Please understand that emergencies sometimes delay scheduled procedures.  2. Diet: Do not eat solid foods after midnight.  The patient may have clear liquids until 5am upon the day of the procedure.  3. Labs: You will need to have blood drawn on TODAY  4. Medication instructions in preparation for your procedure:   Contrast Allergy: No  DO NOT TAKE THE FOLLOWING MEDICATIONS ON THE MORNING OF YOUR PROCEDURE:  LASIX TRESIBA HUMALOG   On the morning of your procedure, take your Aspirin and any morning medicines NOT listed above.  You may use sips of water.  5. Plan for one night stay--bring personal belongings. 6. Bring a current list of your medications and current insurance cards. 7. You MUST have a responsible person to drive you home. 8. Someone MUST be with you the first 24 hours after you arrive home or your discharge will be delayed. 9. Please wear clothes that are easy to get on and off and wear  slip-on shoes.  Thank you for allowing [place] to care for you!   -- [facility]

## 2017-11-20 LAB — BASIC METABOLIC PANEL
BUN/Creatinine Ratio: 15 (ref 12–28)
BUN: 20 mg/dL (ref 8–27)
CO2: 26 mmol/L (ref 20–29)
Calcium: 9.3 mg/dL (ref 8.7–10.3)
Chloride: 97 mmol/L (ref 96–106)
Creatinine, Ser: 1.33 mg/dL — ABNORMAL HIGH (ref 0.57–1.00)
GFR calc Af Amer: 49 mL/min/{1.73_m2} — ABNORMAL LOW (ref >59)
GFR calc non Af Amer: 43 mL/min/{1.73_m2} — ABNORMAL LOW (ref >59)
Glucose: 314 mg/dL — ABNORMAL HIGH (ref 65–99)
Potassium: 4.5 mmol/L (ref 3.5–5.2)
Sodium: 139 mmol/L (ref 134–144)

## 2017-11-20 LAB — CBC
Hematocrit: 34.3 % (ref 34.0–46.6)
Hemoglobin: 11.3 g/dL (ref 11.1–15.9)
MCH: 28.3 pg (ref 26.6–33.0)
MCHC: 32.9 g/dL (ref 31.5–35.7)
MCV: 86 fL (ref 79–97)
Platelets: 171 10*3/uL (ref 150–450)
RBC: 3.99 x10E6/uL (ref 3.77–5.28)
RDW: 13.3 % (ref 12.3–15.4)
WBC: 6 10*3/uL (ref 3.4–10.8)

## 2017-11-22 ENCOUNTER — Telehealth: Payer: Self-pay | Admitting: Interventional Cardiology

## 2017-11-22 NOTE — Telephone Encounter (Signed)
[name] wants to know when her open heart surgery will be. Reiterated to her that her procedure scheduled for this Friday is for a cath, not open heart surgery, and clarified with her they will have to talk and get her consent should open heart surgery be necessary.  She was grateful for call back.

## 2017-11-22 NOTE — Telephone Encounter (Signed)
Left message to call back  

## 2017-11-22 NOTE — Telephone Encounter (Signed)
New message   Patient has questions about open heart surgery on 11/26/2017 with Dr. Tamala Julian. Please call the patient to discuss.

## 2017-11-24 ENCOUNTER — Telehealth: Payer: Self-pay | Admitting: Interventional Cardiology

## 2017-11-24 ENCOUNTER — Telehealth: Payer: Self-pay | Admitting: *Deleted

## 2017-11-24 NOTE — Telephone Encounter (Signed)
New Message ° ° ° ° ° ° ° ° ° °Patient returned your call, would like a call back. °

## 2017-11-24 NOTE — Telephone Encounter (Signed)
Left message for patient to call back to [name] re: procedure.

## 2017-11-24 NOTE — Telephone Encounter (Signed)
-----   Message from Isaiah Serge, NP sent at 11/22/2017 11:32 AM EDT ----- Labs ok for cath, ask pt to hold cozaar the day before cath and the day of the cath.  thanks

## 2017-11-24 NOTE — Telephone Encounter (Signed)
Follow up: ° ° °Patient returning call back °

## 2017-11-24 NOTE — Telephone Encounter (Signed)
See previous phone note.  

## 2017-11-24 NOTE — Telephone Encounter (Signed)
Pt returned my call. She has also been made aware of the instructions for her upcoming cath on Friday, 11/26/17, which are below. Pt is aware that she will need IV Hydration as well as the medications below to hold. Pt verbalized understanding.

## 2017-11-24 NOTE — Telephone Encounter (Signed)
Pt contacted pre-catheterization scheduled at [facility] for: Friday November 26, 2017 10:30 AM Verified arrival time and place: [place] Entrance A at: 5:30 AM-pre-procedure hydration  No solid food after midnight prior to cath, clear liquids until 5 AM day of procedure. Contrast allergy: no  Hold: Losartan-day before and day of procedure. Furosemide-day before and day of procedure. KCl-day before and day of procedure. Insulin-AM of procedure. 1/2 Insulin -PM prior to procedure.  Except hold medications AM meds can be  taken pre-cath with sip of water including: ASA 81 mg Effient 10 mg  Confirmed patient has responsible person to drive home post procedure and for 24 hours after you arrive home:  LMTCB to discuss instructions with patient-need to let her know she will be getting 4 hours IV hydration pre-procedure.

## 2017-11-24 NOTE — Telephone Encounter (Signed)
Tried calling pt re: lab results. Left another message for pt to call back.

## 2017-11-25 ENCOUNTER — Encounter (HOSPITAL_COMMUNITY): Payer: Medicare Other

## 2017-11-26 ENCOUNTER — Ambulatory Visit (HOSPITAL_COMMUNITY)
Admission: RE | Admit: 2017-11-26 | Discharge: 2017-11-26 | Disposition: A | Discharge status: Home / Self Care | Payer: Medicare Other | Source: Ambulatory Visit | Attending: Interventional Cardiology | Admitting: Interventional Cardiology

## 2017-11-26 ENCOUNTER — Encounter (HOSPITAL_COMMUNITY)
Admission: RE | Disposition: A | Discharge status: Home / Self Care | Payer: Self-pay | Source: Ambulatory Visit | Attending: Interventional Cardiology

## 2017-11-26 ENCOUNTER — Other Ambulatory Visit: Payer: Self-pay

## 2017-11-26 DIAGNOSIS — Z885 Allergy status to narcotic agent status: Secondary | ICD-10-CM | POA: Insufficient documentation

## 2017-11-26 DIAGNOSIS — J45909 Unspecified asthma, uncomplicated: Secondary | ICD-10-CM | POA: Insufficient documentation

## 2017-11-26 DIAGNOSIS — Z794 Long term (current) use of insulin: Secondary | ICD-10-CM | POA: Insufficient documentation

## 2017-11-26 DIAGNOSIS — Z9049 Acquired absence of other specified parts of digestive tract: Secondary | ICD-10-CM | POA: Diagnosis not present

## 2017-11-26 DIAGNOSIS — Z85828 Personal history of other malignant neoplasm of skin: Secondary | ICD-10-CM | POA: Insufficient documentation

## 2017-11-26 DIAGNOSIS — I2510 Atherosclerotic heart disease of native coronary artery without angina pectoris: Secondary | ICD-10-CM

## 2017-11-26 DIAGNOSIS — Z888 Allergy status to other drugs, medicaments and biological substances status: Secondary | ICD-10-CM | POA: Insufficient documentation

## 2017-11-26 DIAGNOSIS — Z9861 Coronary angioplasty status: Secondary | ICD-10-CM

## 2017-11-26 DIAGNOSIS — I252 Old myocardial infarction: Secondary | ICD-10-CM | POA: Insufficient documentation

## 2017-11-26 DIAGNOSIS — E785 Hyperlipidemia, unspecified: Secondary | ICD-10-CM | POA: Insufficient documentation

## 2017-11-26 DIAGNOSIS — E079 Disorder of thyroid, unspecified: Secondary | ICD-10-CM | POA: Insufficient documentation

## 2017-11-26 DIAGNOSIS — Z79899 Other long term (current) drug therapy: Secondary | ICD-10-CM | POA: Insufficient documentation

## 2017-11-26 DIAGNOSIS — R0600 Dyspnea, unspecified: Secondary | ICD-10-CM | POA: Insufficient documentation

## 2017-11-26 DIAGNOSIS — I4510 Unspecified right bundle-branch block: Secondary | ICD-10-CM | POA: Diagnosis not present

## 2017-11-26 DIAGNOSIS — M1990 Unspecified osteoarthritis, unspecified site: Secondary | ICD-10-CM | POA: Insufficient documentation

## 2017-11-26 DIAGNOSIS — Z79890 Hormone replacement therapy (postmenopausal): Secondary | ICD-10-CM | POA: Insufficient documentation

## 2017-11-26 DIAGNOSIS — I10 Essential (primary) hypertension: Secondary | ICD-10-CM | POA: Insufficient documentation

## 2017-11-26 DIAGNOSIS — Z7902 Long term (current) use of antithrombotics/antiplatelets: Secondary | ICD-10-CM | POA: Insufficient documentation

## 2017-11-26 DIAGNOSIS — F410 Panic disorder [episodic paroxysmal anxiety] without agoraphobia: Secondary | ICD-10-CM | POA: Diagnosis not present

## 2017-11-26 DIAGNOSIS — I209 Angina pectoris, unspecified: Secondary | ICD-10-CM | POA: Diagnosis present

## 2017-11-26 DIAGNOSIS — Z8249 Family history of ischemic heart disease and other diseases of the circulatory system: Secondary | ICD-10-CM | POA: Insufficient documentation

## 2017-11-26 DIAGNOSIS — Z7951 Long term (current) use of inhaled steroids: Secondary | ICD-10-CM | POA: Diagnosis not present

## 2017-11-26 DIAGNOSIS — K219 Gastro-esophageal reflux disease without esophagitis: Secondary | ICD-10-CM | POA: Insufficient documentation

## 2017-11-26 DIAGNOSIS — Z955 Presence of coronary angioplasty implant and graft: Secondary | ICD-10-CM | POA: Insufficient documentation

## 2017-11-26 DIAGNOSIS — G4733 Obstructive sleep apnea (adult) (pediatric): Secondary | ICD-10-CM | POA: Insufficient documentation

## 2017-11-26 DIAGNOSIS — I25119 Atherosclerotic heart disease of native coronary artery with unspecified angina pectoris: Secondary | ICD-10-CM | POA: Insufficient documentation

## 2017-11-26 DIAGNOSIS — I951 Orthostatic hypotension: Secondary | ICD-10-CM | POA: Diagnosis not present

## 2017-11-26 DIAGNOSIS — Z8673 Personal history of transient ischemic attack (TIA), and cerebral infarction without residual deficits: Secondary | ICD-10-CM | POA: Insufficient documentation

## 2017-11-26 DIAGNOSIS — Z7982 Long term (current) use of aspirin: Secondary | ICD-10-CM | POA: Diagnosis not present

## 2017-11-26 DIAGNOSIS — R9439 Abnormal result of other cardiovascular function study: Secondary | ICD-10-CM | POA: Diagnosis present

## 2017-11-26 DIAGNOSIS — Z90710 Acquired absence of both cervix and uterus: Secondary | ICD-10-CM | POA: Insufficient documentation

## 2017-11-26 DIAGNOSIS — Z836 Family history of other diseases of the respiratory system: Secondary | ICD-10-CM | POA: Insufficient documentation

## 2017-11-26 DIAGNOSIS — Z9889 Other specified postprocedural states: Secondary | ICD-10-CM | POA: Insufficient documentation

## 2017-11-26 DIAGNOSIS — E1140 Type 2 diabetes mellitus with diabetic neuropathy, unspecified: Secondary | ICD-10-CM | POA: Diagnosis not present

## 2017-11-26 HISTORY — PX: LEFT HEART CATH AND CORONARY ANGIOGRAPHY: CATH118249

## 2017-11-26 LAB — GLUCOSE, CAPILLARY
Glucose-Capillary: 262 mg/dL — ABNORMAL HIGH (ref 70–99)
Glucose-Capillary: 144 mg/dL — ABNORMAL HIGH (ref 70–99)

## 2017-11-26 SURGERY — LEFT HEART CATH AND CORONARY ANGIOGRAPHY
Anesthesia: LOCAL

## 2017-11-26 MED ORDER — SODIUM CHLORIDE 0.9% FLUSH: 3.0000 mL | INTRAVENOUS | Status: DC | PRN

## 2017-11-26 MED ORDER — LIDOCAINE HCL (PF) 1 % IJ SOLN
INTRAMUSCULAR | Status: AC
  Filled 2017-11-26: qty 30

## 2017-11-26 MED ORDER — PRASUGREL HCL 10 MG PO TABS
10.0000 mg | ORAL_TABLET | Freq: Once | ORAL | Status: AC
  Administered 2017-11-26: 10 mg via ORAL

## 2017-11-26 MED ORDER — FENTANYL CITRATE (PF) 100 MCG/2ML IJ SOLN
INTRAMUSCULAR | Status: AC
  Filled 2017-11-26: qty 2

## 2017-11-26 MED ORDER — SODIUM CHLORIDE 0.9 % IV SOLN: 250.0000 mL | INTRAVENOUS | Status: DC | PRN

## 2017-11-26 MED ORDER — SODIUM CHLORIDE 0.9 % IV SOLN: INTRAVENOUS | Status: AC

## 2017-11-26 MED ORDER — MIDAZOLAM HCL 2 MG/2ML IJ SOLN
INTRAMUSCULAR | Status: AC
  Filled 2017-11-26: qty 2

## 2017-11-26 MED ORDER — ONDANSETRON HCL 4 MG/2ML IJ SOLN: 4.0000 mg | Freq: Four times a day (QID) | INTRAMUSCULAR | Status: DC | PRN

## 2017-11-26 MED ORDER — ASPIRIN 81 MG PO CHEW: 81.0000 mg | CHEWABLE_TABLET | Freq: Every day | ORAL | Status: DC

## 2017-11-26 MED ORDER — SODIUM CHLORIDE 0.9% FLUSH: 3.0000 mL | Freq: Two times a day (BID) | INTRAVENOUS | Status: DC

## 2017-11-26 MED ORDER — IOHEXOL 350 MG/ML SOLN
INTRAVENOUS | Status: DC | PRN
  Administered 2017-11-26: 55 mL via INTRA_ARTERIAL

## 2017-11-26 MED ORDER — ACETAMINOPHEN 325 MG PO TABS: 650.0000 mg | ORAL_TABLET | ORAL | Status: DC | PRN

## 2017-11-26 MED ORDER — HEPARIN (PORCINE) IN NACL 1000-0.9 UT/500ML-% IV SOLN
INTRAVENOUS | Status: DC | PRN
  Administered 2017-11-26 (×2): 500 mL

## 2017-11-26 MED ORDER — PRASUGREL HCL 10 MG PO TABS: 10.0000 mg | ORAL_TABLET | Freq: Every day | ORAL | Status: DC

## 2017-11-26 MED ORDER — MIDAZOLAM HCL 2 MG/2ML IJ SOLN
INTRAMUSCULAR | Status: DC | PRN
  Administered 2017-11-26 (×2): 1 mg via INTRAVENOUS

## 2017-11-26 MED ORDER — PRASUGREL HCL 10 MG PO TABS
ORAL_TABLET | ORAL | Status: AC
  Administered 2017-11-26: 10 mg via ORAL
  Filled 2017-11-26: qty 1

## 2017-11-26 MED ORDER — SODIUM CHLORIDE 0.9 % WEIGHT BASED INFUSION
1.0000 mL/kg/h | INTRAVENOUS | Status: DC
  Administered 2017-11-26: 1 mL/kg/h via INTRAVENOUS

## 2017-11-26 MED ORDER — HEPARIN (PORCINE) IN NACL 1000-0.9 UT/500ML-% IV SOLN
INTRAVENOUS | Status: AC
  Filled 2017-11-26: qty 1000

## 2017-11-26 MED ORDER — LIDOCAINE HCL (PF) 1 % IJ SOLN
INTRAMUSCULAR | Status: DC | PRN
  Administered 2017-11-26: 40 mL via SUBCUTANEOUS

## 2017-11-26 MED ORDER — SODIUM CHLORIDE 0.9 % WEIGHT BASED INFUSION
3.0000 mL/kg/h | INTRAVENOUS | Status: AC
  Administered 2017-11-26: 3 mL/kg/h via INTRAVENOUS

## 2017-11-26 MED ORDER — ASPIRIN 81 MG PO CHEW: 81.0000 mg | CHEWABLE_TABLET | ORAL | Status: DC

## 2017-11-26 MED ORDER — FENTANYL CITRATE (PF) 100 MCG/2ML IJ SOLN
INTRAMUSCULAR | Status: DC | PRN
  Administered 2017-11-26 (×3): 25 ug via INTRAVENOUS

## 2017-11-26 SURGICAL SUPPLY — 8 items
CATH INFINITI 5FR MULTPACK ANG (CATHETERS) ×1 IMPLANT
KIT HEART LEFT (KITS) ×2 IMPLANT
PACK CARDIAC CATHETERIZATION (CUSTOM PROCEDURE TRAY) ×2 IMPLANT
SHEATH PINNACLE 5F 10CM (SHEATH) ×2 IMPLANT
SHEATH PROBE COVER 6X72 (BAG) ×1 IMPLANT
TRANSDUCER W/STOPCOCK (MISCELLANEOUS) ×2 IMPLANT
TUBING CIL FLEX 10 FLL-RA (TUBING) ×2 IMPLANT
WIRE EMERALD 3MM-J .035X150CM (WIRE) ×2 IMPLANT

## 2017-11-26 NOTE — Discharge Instructions (Signed)

## 2017-11-26 NOTE — Progress Notes (Signed)
Site area: right groin Site Prior to Removal:  Level  Pressure Applied For: 20 minutes Manual:   yes Patient Status During Pull:  Awake and stable Post Pull Site:  Level Post Pull Instructions Given:  yes Post Pull Pulses Present: yes Dressing Applied:  yes Bedrest begins @ 1224 Comments: Pain free

## 2017-11-26 NOTE — Interval H&P Note (Signed)
Cath Lab Visit (complete for each Cath Lab visit)  Clinical Evaluation Leading to the Procedure:   ACS: No.  Non-ACS:    Anginal Classification: CCS III  Anti-ischemic medical therapy: No Therapy  Non-Invasive Test Results: No non-invasive testing performed  Prior CABG: No previous CABG      History and Physical Interval Note:  11/26/2017 11:36 AM  [name]  has presented today for surgery, with the diagnosis of cp  The various methods of treatment have been discussed with the patient and family. After consideration of risks, benefits and other options for treatment, the patient has consented to  Procedure(s): LEFT HEART CATH AND CORONARY ANGIOGRAPHY (N/A) as a surgical intervention .  The patient's history has been reviewed, patient examined, no change in status, stable for surgery.  I have reviewed the patient's chart and labs.  Questions were answered to the patient's satisfaction.     Belva Crome III

## 2017-11-29 ENCOUNTER — Telehealth: Payer: Self-pay | Admitting: *Deleted

## 2017-11-29 ENCOUNTER — Encounter (HOSPITAL_COMMUNITY): Payer: Self-pay | Admitting: Interventional Cardiology

## 2017-11-29 ENCOUNTER — Ambulatory Visit: Payer: Medicare Other | Admitting: Interventional Cardiology

## 2017-11-29 DIAGNOSIS — Z9861 Coronary angioplasty status: Secondary | ICD-10-CM

## 2017-11-29 DIAGNOSIS — I25110 Atherosclerotic heart disease of native coronary artery with unstable angina pectoris: Secondary | ICD-10-CM

## 2017-11-29 DIAGNOSIS — I2510 Atherosclerotic heart disease of native coronary artery without angina pectoris: Secondary | ICD-10-CM

## 2017-11-29 NOTE — Telephone Encounter (Signed)
-----   Message from Isaiah Serge, NP sent at 11/28/2017  8:54 PM EDT ----- Cath was stable please arrange for pt to go to phase 2 cardiac rehab thanks

## 2017-11-29 NOTE — Telephone Encounter (Signed)
Referral to cardiac rehab at [facility] placed.

## 2017-12-01 ENCOUNTER — Emergency Department (HOSPITAL_COMMUNITY): Payer: Medicare Other

## 2017-12-01 ENCOUNTER — Encounter (HOSPITAL_COMMUNITY): Payer: Self-pay

## 2017-12-01 ENCOUNTER — Other Ambulatory Visit: Payer: Self-pay

## 2017-12-01 ENCOUNTER — Telehealth: Payer: Self-pay | Admitting: Cardiology

## 2017-12-01 ENCOUNTER — Observation Stay (HOSPITAL_COMMUNITY): Payer: Medicare Other

## 2017-12-01 ENCOUNTER — Observation Stay (HOSPITAL_COMMUNITY)
Admission: EM | Admit: 2017-12-01 | Discharge: 2017-12-02 | Disposition: A | Discharge status: Home / Self Care | Payer: Medicare Other | Attending: Internal Medicine | Admitting: Internal Medicine

## 2017-12-01 DIAGNOSIS — F331 Major depressive disorder, recurrent, moderate: Secondary | ICD-10-CM | POA: Insufficient documentation

## 2017-12-01 DIAGNOSIS — Z885 Allergy status to narcotic agent status: Secondary | ICD-10-CM | POA: Insufficient documentation

## 2017-12-01 DIAGNOSIS — I454 Nonspecific intraventricular block: Secondary | ICD-10-CM | POA: Diagnosis not present

## 2017-12-01 DIAGNOSIS — R42 Dizziness and giddiness: Secondary | ICD-10-CM | POA: Diagnosis not present

## 2017-12-01 DIAGNOSIS — I10 Essential (primary) hypertension: Secondary | ICD-10-CM | POA: Diagnosis not present

## 2017-12-01 DIAGNOSIS — R51 Headache: Secondary | ICD-10-CM | POA: Diagnosis not present

## 2017-12-01 DIAGNOSIS — Z794 Long term (current) use of insulin: Secondary | ICD-10-CM | POA: Diagnosis not present

## 2017-12-01 DIAGNOSIS — Z85828 Personal history of other malignant neoplasm of skin: Secondary | ICD-10-CM | POA: Diagnosis not present

## 2017-12-01 DIAGNOSIS — R0602 Shortness of breath: Secondary | ICD-10-CM | POA: Diagnosis not present

## 2017-12-01 DIAGNOSIS — E079 Disorder of thyroid, unspecified: Secondary | ICD-10-CM | POA: Insufficient documentation

## 2017-12-01 DIAGNOSIS — Z888 Allergy status to other drugs, medicaments and biological substances status: Secondary | ICD-10-CM | POA: Insufficient documentation

## 2017-12-01 DIAGNOSIS — I252 Old myocardial infarction: Secondary | ICD-10-CM | POA: Diagnosis not present

## 2017-12-01 DIAGNOSIS — I2510 Atherosclerotic heart disease of native coronary artery without angina pectoris: Secondary | ICD-10-CM | POA: Diagnosis not present

## 2017-12-01 DIAGNOSIS — Z9861 Coronary angioplasty status: Secondary | ICD-10-CM

## 2017-12-01 DIAGNOSIS — G4733 Obstructive sleep apnea (adult) (pediatric): Secondary | ICD-10-CM | POA: Diagnosis present

## 2017-12-01 DIAGNOSIS — E7800 Pure hypercholesterolemia, unspecified: Secondary | ICD-10-CM | POA: Diagnosis not present

## 2017-12-01 DIAGNOSIS — G4730 Sleep apnea, unspecified: Secondary | ICD-10-CM | POA: Diagnosis not present

## 2017-12-01 DIAGNOSIS — R069 Unspecified abnormalities of breathing: Secondary | ICD-10-CM | POA: Diagnosis not present

## 2017-12-01 DIAGNOSIS — J45909 Unspecified asthma, uncomplicated: Secondary | ICD-10-CM | POA: Diagnosis not present

## 2017-12-01 DIAGNOSIS — Z79899 Other long term (current) drug therapy: Secondary | ICD-10-CM | POA: Diagnosis not present

## 2017-12-01 DIAGNOSIS — R0789 Other chest pain: Secondary | ICD-10-CM | POA: Diagnosis not present

## 2017-12-01 DIAGNOSIS — R0689 Other abnormalities of breathing: Secondary | ICD-10-CM | POA: Diagnosis not present

## 2017-12-01 DIAGNOSIS — E1140 Type 2 diabetes mellitus with diabetic neuropathy, unspecified: Secondary | ICD-10-CM | POA: Insufficient documentation

## 2017-12-01 DIAGNOSIS — R55 Syncope and collapse: Secondary | ICD-10-CM

## 2017-12-01 DIAGNOSIS — Z86718 Personal history of other venous thrombosis and embolism: Secondary | ICD-10-CM | POA: Insufficient documentation

## 2017-12-01 DIAGNOSIS — K219 Gastro-esophageal reflux disease without esophagitis: Secondary | ICD-10-CM | POA: Diagnosis not present

## 2017-12-01 DIAGNOSIS — Z7982 Long term (current) use of aspirin: Secondary | ICD-10-CM | POA: Diagnosis not present

## 2017-12-01 DIAGNOSIS — Z955 Presence of coronary angioplasty implant and graft: Secondary | ICD-10-CM | POA: Insufficient documentation

## 2017-12-01 DIAGNOSIS — R079 Chest pain, unspecified: Secondary | ICD-10-CM | POA: Diagnosis present

## 2017-12-01 LAB — CBC WITH DIFFERENTIAL/PLATELET
Abs Immature Granulocytes: 0.02 10*3/uL (ref 0.00–0.07)
Basophils Absolute: 0 10*3/uL (ref 0.0–0.1)
Basophils Relative: 0 %
Eosinophils Absolute: 0.1 10*3/uL (ref 0.0–0.5)
Eosinophils Relative: 2 %
HCT: 34 % — ABNORMAL LOW (ref 36.0–46.0)
Hemoglobin: 10.4 g/dL — ABNORMAL LOW (ref 12.0–15.0)
Immature Granulocytes: 0 %
Lymphocytes Relative: 19 %
Lymphs Abs: 1.1 10*3/uL (ref 0.7–4.0)
MCH: 26.8 pg (ref 26.0–34.0)
MCHC: 30.6 g/dL (ref 30.0–36.0)
MCV: 87.6 fL (ref 80.0–100.0)
Monocytes Absolute: 0.3 10*3/uL (ref 0.1–1.0)
Monocytes Relative: 6 %
Neutro Abs: 4.1 10*3/uL (ref 1.7–7.7)
Neutrophils Relative %: 73 %
Platelets: 179 10*3/uL (ref 150–400)
RBC: 3.88 MIL/uL (ref 3.87–5.11)
RDW: 14.3 % (ref 11.5–15.5)
WBC: 5.6 10*3/uL (ref 4.0–10.5)
nRBC: 0 % (ref 0.0–0.2)

## 2017-12-01 LAB — BASIC METABOLIC PANEL
Anion gap: 11 (ref 5–15)
BUN: 18 mg/dL (ref 8–23)
CO2: 26 mmol/L (ref 22–32)
Calcium: 8.8 mg/dL — ABNORMAL LOW (ref 8.9–10.3)
Chloride: 101 mmol/L (ref 98–111)
Creatinine, Ser: 1.25 mg/dL — ABNORMAL HIGH (ref 0.44–1.00)
GFR calc Af Amer: 52 mL/min — ABNORMAL LOW (ref >60)
GFR calc non Af Amer: 45 mL/min — ABNORMAL LOW (ref >60)
Glucose, Bld: 373 mg/dL — ABNORMAL HIGH (ref 70–99)
Potassium: 4 mmol/L (ref 3.5–5.1)
Sodium: 138 mmol/L (ref 135–145)

## 2017-12-01 LAB — TROPONIN I
Troponin I: <0.03 ng/mL (ref <0.03)
Troponin I: <0.03 ng/mL (ref <0.03)

## 2017-12-01 LAB — CBG MONITORING, ED: Glucose-Capillary: 211 mg/dL — ABNORMAL HIGH (ref 70–99)

## 2017-12-01 MED ORDER — SODIUM CHLORIDE 0.9 % IV SOLN
INTRAVENOUS | Status: DC
  Administered 2017-12-01 – 2017-12-02 (×2): via INTRAVENOUS

## 2017-12-01 MED ORDER — SODIUM CHLORIDE 0.9 % IV SOLN: INTRAVENOUS | Status: DC

## 2017-12-01 MED ORDER — ONDANSETRON HCL 4 MG/2ML IJ SOLN: 4.0000 mg | Freq: Four times a day (QID) | INTRAMUSCULAR | Status: DC | PRN

## 2017-12-01 MED ORDER — SODIUM CHLORIDE 0.9 % IV BOLUS
500.0000 mL | Freq: Once | INTRAVENOUS | Status: AC
  Administered 2017-12-01: 500 mL via INTRAVENOUS

## 2017-12-01 MED ORDER — PRASUGREL HCL 10 MG PO TABS
10.0000 mg | ORAL_TABLET | Freq: Every day | ORAL | Status: DC
  Administered 2017-12-01 – 2017-12-02 (×2): 10 mg via ORAL
  Filled 2017-12-01 (×2): qty 1

## 2017-12-01 MED ORDER — LEVOTHYROXINE SODIUM 137 MCG PO TABS
137.0000 ug | ORAL_TABLET | Freq: Every day | ORAL | Status: DC
  Administered 2017-12-02: 137 ug via ORAL
  Filled 2017-12-01 (×2): qty 1

## 2017-12-01 MED ORDER — ROSUVASTATIN CALCIUM 10 MG PO TABS
40.0000 mg | ORAL_TABLET | Freq: Every morning | ORAL | Status: DC
  Administered 2017-12-02: 40 mg via ORAL
  Filled 2017-12-01 (×2): qty 1

## 2017-12-01 MED ORDER — TIOTROPIUM BROMIDE MONOHYDRATE 18 MCG IN CAPS
18.0000 ug | ORAL_CAPSULE | Freq: Every day | RESPIRATORY_TRACT | Status: DC
  Administered 2017-12-02: 18 ug via RESPIRATORY_TRACT
  Filled 2017-12-01 (×2): qty 5

## 2017-12-01 MED ORDER — FLUTICASONE PROPIONATE 50 MCG/ACT NA SUSP
1.0000 | Freq: Two times a day (BID) | NASAL | Status: DC
  Filled 2017-12-01: qty 16

## 2017-12-01 MED ORDER — SENNOSIDES-DOCUSATE SODIUM 8.6-50 MG PO TABS
1.0000 | ORAL_TABLET | Freq: Every evening | ORAL | Status: DC | PRN
  Filled 2017-12-01: qty 1

## 2017-12-01 MED ORDER — MOMETASONE FURO-FORMOTEROL FUM 200-5 MCG/ACT IN AERO
2.0000 | INHALATION_SPRAY | Freq: Two times a day (BID) | RESPIRATORY_TRACT | Status: DC
  Administered 2017-12-02: 2 via RESPIRATORY_TRACT
  Filled 2017-12-01: qty 8.8

## 2017-12-01 MED ORDER — LEVOTHYROXINE SODIUM 50 MCG PO TABS
125.0000 ug | ORAL_TABLET | Freq: Every day | ORAL | Status: DC
  Administered 2017-12-02: 125 ug via ORAL
  Filled 2017-12-01: qty 3

## 2017-12-01 MED ORDER — HEPARIN SODIUM (PORCINE) 5000 UNIT/ML IJ SOLN
5000.0000 [IU] | Freq: Three times a day (TID) | INTRAMUSCULAR | Status: DC
  Administered 2017-12-01 – 2017-12-02 (×2): 5000 [IU] via SUBCUTANEOUS
  Filled 2017-12-01 (×2): qty 1

## 2017-12-01 MED ORDER — ACETAMINOPHEN 325 MG PO TABS: 650.0000 mg | ORAL_TABLET | Freq: Four times a day (QID) | ORAL | Status: DC | PRN

## 2017-12-01 MED ORDER — MIRTAZAPINE 15 MG PO TABS
30.0000 mg | ORAL_TABLET | Freq: Every day | ORAL | Status: DC
  Administered 2017-12-01: 30 mg via ORAL
  Filled 2017-12-01: qty 2

## 2017-12-01 MED ORDER — INSULIN GLARGINE 100 UNIT/ML ~~LOC~~ SOLN
40.0000 [IU] | Freq: Every day | SUBCUTANEOUS | Status: DC
  Administered 2017-12-01 – 2017-12-02 (×2): 40 [IU] via SUBCUTANEOUS
  Filled 2017-12-01 (×3): qty 0.4

## 2017-12-01 MED ORDER — PANTOPRAZOLE SODIUM 40 MG PO TBEC
40.0000 mg | DELAYED_RELEASE_TABLET | Freq: Every day | ORAL | Status: DC
  Administered 2017-12-01 – 2017-12-02 (×2): 40 mg via ORAL
  Filled 2017-12-01 (×2): qty 1

## 2017-12-01 MED ORDER — ACETAMINOPHEN 650 MG RE SUPP: 650.0000 mg | Freq: Four times a day (QID) | RECTAL | Status: DC | PRN

## 2017-12-01 MED ORDER — ASPIRIN EC 81 MG PO TBEC
81.0000 mg | DELAYED_RELEASE_TABLET | Freq: Every day | ORAL | Status: DC
  Administered 2017-12-01 – 2017-12-02 (×2): 81 mg via ORAL
  Filled 2017-12-01 (×2): qty 1

## 2017-12-01 MED ORDER — INSULIN ASPART 100 UNIT/ML ~~LOC~~ SOLN
0.0000 [IU] | Freq: Three times a day (TID) | SUBCUTANEOUS | Status: DC
  Administered 2017-12-02: 2 [IU] via SUBCUTANEOUS
  Filled 2017-12-01: qty 1

## 2017-12-01 MED ORDER — IOPAMIDOL (ISOVUE-370) INJECTION 76%
100.0000 mL | Freq: Once | INTRAVENOUS | Status: AC | PRN
  Administered 2017-12-01: 80 mL via INTRAVENOUS

## 2017-12-01 MED ORDER — METOPROLOL SUCCINATE ER 25 MG PO TB24
25.0000 mg | ORAL_TABLET | Freq: Every day | ORAL | Status: DC
  Administered 2017-12-01 – 2017-12-02 (×2): 25 mg via ORAL
  Filled 2017-12-01 (×2): qty 1

## 2017-12-01 MED ORDER — NITROGLYCERIN 0.4 MG SL SUBL
0.4000 mg | SUBLINGUAL_TABLET | SUBLINGUAL | Status: DC | PRN
  Administered 2017-12-02: 0.4 mg via SUBLINGUAL
  Filled 2017-12-01: qty 1

## 2017-12-01 MED ORDER — ONDANSETRON HCL 4 MG PO TABS: 4.0000 mg | ORAL_TABLET | Freq: Four times a day (QID) | ORAL | Status: DC | PRN

## 2017-12-01 MED ORDER — PAROXETINE HCL 20 MG PO TABS
20.0000 mg | ORAL_TABLET | Freq: Every day | ORAL | Status: DC
  Administered 2017-12-01 – 2017-12-02 (×2): 20 mg via ORAL
  Filled 2017-12-01 (×4): qty 1

## 2017-12-01 MED ORDER — DIAZEPAM 5 MG PO TABS
5.0000 mg | ORAL_TABLET | Freq: Two times a day (BID) | ORAL | Status: DC
  Administered 2017-12-02: 5 mg via ORAL
  Filled 2017-12-01: qty 1

## 2017-12-01 NOTE — ED Provider Notes (Signed)
[facility] EMERGENCY DEPARTMENT Provider Note   CSN: [id number] Arrival date & time: 12/01/17  1420     History   Chief Complaint Chief Complaint  Patient presents with  . Shortness of Breath    HPI [name] is a 62 y.o. female.  HPI  Pt was seen at 1435. Per pt, c/o gradual onset and persistence of multiple recurrent episodes of chest pain for the past 2 days. Pt states she had a "repeat heart cath on Monday due to my stent was closing up" on her previous cardiac cath 2 weeks ago. Pt states she "takes 7 steps" and develops left sided chest "pain," SOB and lightheadedness ("see black spots in front of my eyes and have to hold on to something") states she "feels like I'm going to fall" (and pass out). States she did "pass out" during one of these episodes several weeks ago. Pt states she takes her own SL ntg with improvement of the CP. Pt states these were the symptoms that she had before her cardiac caths and she is concerned again regarding her "stent or being allergic to the Effient like I was to the last medicine (Brilinta)."  Pt states she called her Cards MD and was told to go to [facility] ED for evaluation, but she came to [facility] ED instead. Denies rash, no cough, no palpitations, no back pain, no abd pain, no N/V/D, no eye pain, no visual field cuts, no blurred vision, no focal motor weakness, no tingling/numbness in extremities, no ataxia, no slurred speech, no facial droop.        Past Medical History:  Diagnosis Date  . Arthritis   . Asthma   . Chest pain   . Coronary artery disease    a. LAD PCI in 2005 b. cath in 2014 showed patent pLAD stent c. Cath 06/11/17 severe ISR of overlapping Cypher DES in pLAD s/p cutting ballon PTCA only; mild dLAD dz  . Diabetes mellitus   . Diabetic neuropathy ([place])   . GERD (gastroesophageal reflux disease)   . History of skin cancer   . Hypercholesteremia   . Hypertension   . Myocardial infarction ([place])   . Panic attack   . Sleep apnea    . Thyroid disease     Patient Active Problem List   Diagnosis Date Noted  . MDD (major depressive disorder), recurrent episode, moderate ([place]) 11/09/2017  . Angina pectoris ([place]) 09/07/2017  . Renal insufficiency 06/11/2017  . Coronary artery disease involving native coronary artery of native heart with unstable angina pectoris ([place]) 06/11/2017  . Chest pain with moderate risk of acute coronary syndrome 06/10/2017  . GERD (gastroesophageal reflux disease) 06/10/2017  . Sleep apnea 06/10/2017  . Hypomagnesemia 06/10/2017  . AKI (acute kidney injury) ([place]) 06/10/2017  . Hyperlipidemia 01/02/2013  . Hypertension   . Thyroid disease   . CAD S/P percutaneous coronary angioplasty 06/23/2012  . Abnormal cardiovascular stress test 06/23/2012    Past Surgical History:  Procedure Laterality Date  . ABDOMINAL HYSTERECTOMY    . BACK SURGERY    . CHOLECYSTECTOMY    . CORONARY ANGIOPLASTY WITH STENT PLACEMENT    . CORONARY BALLOON ANGIOPLASTY N/A 06/11/2017   Procedure: CORONARY BALLOON ANGIOPLASTY;  Surgeon: Leonie Man, MD;  Location: [place] [facility];  Service: Cardiovascular;  Laterality: N/A;  LAD  . CORONARY STENT INTERVENTION  09/07/2017   STENT SYNERGY DES 3X24  . CORONARY STENT INTERVENTION N/A 09/07/2017   Procedure: CORONARY STENT INTERVENTION;  Surgeon: Jettie Booze, MD;  Location: [place] [facility];  Service: Cardiovascular;  Laterality: N/A;  . ELBOW SURGERY    . KNEE SURGERY    . LEFT HEART CATH AND CORONARY ANGIOGRAPHY N/A 06/11/2017   Procedure: LEFT HEART CATH AND CORONARY ANGIOGRAPHY;  Surgeon: Leonie Man, MD;  Location: [place] [facility];  Service: Cardiovascular;  Laterality: N/A;  . LEFT HEART CATH AND CORONARY ANGIOGRAPHY N/A 09/07/2017   Procedure: LEFT HEART CATH AND CORONARY ANGIOGRAPHY;  Surgeon: Jettie Booze, MD;  Location: [place] [facility];  Service: Cardiovascular;  Laterality: N/A;  . LEFT HEART CATH AND CORONARY ANGIOGRAPHY N/A  11/26/2017   Procedure: LEFT HEART CATH AND CORONARY ANGIOGRAPHY;  Surgeon: Belva Crome, MD;  Location: [place] [facility];  Service: Cardiovascular;  Laterality: N/A;  . LEFT HEART CATHETERIZATION WITH CORONARY ANGIOGRAM N/A 06/23/2012   Procedure: LEFT HEART CATHETERIZATION WITH CORONARY ANGIOGRAM;  Surgeon: Sinclair Grooms, MD;  Location: [facility] CATH LAB;  Service: Cardiovascular;  Laterality: N/A;     OB History    Gravida  3   Para  3   Term  3   Preterm      AB      Living        SAB      TAB      Ectopic      Multiple      Live Births               Home Medications    Prior to Admission medications   Medication Sig Start Date End Date Taking? Authorizing Provider  aspirin EC 81 MG tablet Take 81 mg by mouth daily.    [provider]  budesonide-formoterol (SYMBICORT) 160-4.5 MCG/ACT inhaler Inhale 2 puffs into the lungs 2 (two) times daily.    [provider]  cyclobenzaprine (FLEXERIL) 10 MG tablet Take 10 mg by mouth 3 (three) times daily as needed for muscle spasms.    [provider]  diazepam (VALIUM) 5 MG tablet Take 5 mg by mouth 2 (two) times daily.     [provider]  diclofenac sodium (VOLTAREN) 1 % GEL Apply 2-4 g topically 4 (four) times daily as needed (for pain.).  04/08/17   [provider]  DULoxetine (CYMBALTA) 20 MG capsule Start 20 mg daily for one week, then 40 mg daily Patient taking differently: Take 20 mg by mouth daily.  11/09/17   Norman Clay, MD  fluticasone (FLONASE) 50 MCG/ACT nasal spray Place 1 spray into both nostrils 2 (two) times daily.    [provider]  furosemide (LASIX) 40 MG tablet Take 40 mg by mouth daily.     [provider]  hydrOXYzine (ATARAX/VISTARIL) 25 MG tablet Take 25 mg by mouth 2 (two) times daily.  05/05/17   [provider]  insulin degludec (TRESIBA FLEXTOUCH) 100 UNIT/ML SOPN FlexTouch Pen Inject 50 Units into the skin daily.     [provider]  insulin lispro (HUMALOG) 100 UNIT/ML injection Inject 10-20 Units into the skin 3 (three) times daily before meals. Sliding Scale Insulin    [provider]  levothyroxine (SYNTHROID, LEVOTHROID) 125 MCG tablet Take 125 mcg by mouth daily before breakfast.    [provider]  levothyroxine (SYNTHROID, LEVOTHROID) 137 MCG tablet Take 137 mcg by mouth daily before breakfast.    [provider]  losartan (COZAAR) 25 MG tablet TAKE 1 TABLET BY MOUTH EVERY DAY  Patient taking differently: Take 25 mg by mouth daily.  10/22/17   Leanor Kail, PA  Magnesium Oxide 400 (240 Mg) MG TABS Take 400 mg by mouth 2 (two) times daily. 07/06/17   [provider]  metoprolol succinate (TOPROL-XL) 25 MG 24 hr tablet Take 1 tablet (25 mg total) by mouth daily. 09/08/17   Belva Crome, MD  mirtazapine (REMERON) 30 MG tablet Take 1 tablet (30 mg total) by mouth at bedtime. 11/09/17   Norman Clay, MD  mupirocin ointment (BACTROBAN) 2 % Apply 1 application topically daily as needed for rash. 07/10/17   [provider]  nitroGLYCERIN (NITROSTAT) 0.4 MG SL tablet PLACE 1 TABLET (0.4 MG TOTAL) UNDER THE TONGUE EVERY 5 (FIVE) MINUTES AS NEEDED FOR CHEST PAIN. 09/03/17   Isaiah Serge, NP  ondansetron (ZOFRAN) 4 MG tablet Take 4 mg by mouth 3 (three) times daily as needed for nausea or vomiting.  05/22/17   [provider]  Oxycodone HCl 10 MG TABS Take 10 mg by mouth 3 (three) times daily.    [provider]  pantoprazole (PROTONIX) 40 MG tablet Take 1 tablet (40 mg total) by mouth daily. 10/13/17   Bhagat, Crista Luria, PA  PARoxetine (PAXIL) 20 MG tablet Take 20 mg by mouth daily.    [provider]  potassium chloride SA (K-DUR,KLOR-CON) 20 MEQ tablet Take 20 mEq by mouth daily.     [provider]  prasugrel (EFFIENT) 10 MG TABS tablet Take 1 tablet (10 mg total) by mouth daily. 09/29/17   Isaiah Serge, NP    rosuvastatin (CRESTOR) 40 MG tablet Take 40 mg by mouth daily.      [provider]  tiotropium (SPIRIVA) 18 MCG inhalation capsule Place 18 mcg into inhaler and inhale daily.     [provider]  Vitamin D, Ergocalciferol, (DRISDOL) 50000 units CAPS capsule Take 50,000 Units by mouth every Monday.    [provider]    Family History Family History  Problem Relation Age of Onset  . COPD Mother   . Heart disease Father   . Heart attack Father     Social History Social History   Tobacco Use  . Smoking status: Never Smoker  . Smokeless tobacco: Never Used  Substance Use Topics  . Alcohol use: No    Alcohol/week: 0.0 standard drinks  . Drug use: No     Allergies   Tape and Morphine   Review of Systems Review of Systems ROS: Statement: All systems negative except as marked or noted in the HPI; Constitutional: Negative for fever and chills. ; ; Eyes: Negative for eye pain, redness and discharge. ; ; ENMT: Negative for ear pain, hoarseness, nasal congestion, sinus pressure and sore throat. ; ; Cardiovascular: +CP, SOB. Negative for palpitations, diaphoresis, and peripheral edema. ; ; Respiratory: Negative for cough, wheezing and stridor. ; ; Gastrointestinal: Negative for nausea, vomiting, diarrhea, abdominal pain, blood in stool, hematemesis, jaundice and rectal bleeding. . ; ; Genitourinary: Negative for dysuria, flank pain and hematuria. ; ; Musculoskeletal: Negative for back pain and neck pain. Negative for swelling and trauma.; ; Skin: Negative for pruritus, rash, abrasions, blisters, bruising and skin lesion.; ; Neuro: +lightheadedness. Negative for headache and neck stiffness. Negative for weakness, altered level of consciousness, altered mental status, extremity weakness, paresthesias, involuntary movement, seizure and syncope.       Physical Exam Updated Vital Signs BP 126/67 (BP Location: Right Arm)   Pulse 85  Temp 97.8 F (36.6 C) (Oral)    Resp 14   Ht 5\' 4"  (1.626 m)   Wt 93 kg   SpO2 96%   BMI 35.19 kg/m    14:39 Orthostatic Vital Signs TH  Orthostatic Lying   BP- Lying: 123/54   Pulse- Lying: 85       Orthostatic Sitting  BP- Sitting: 113/61   Pulse- Sitting: 90       Orthostatic Standing at 0 minutes  BP- Standing at 0 minutes: 145/67   Pulse- Standing at 0 minutes: 98      Physical Exam 1440: Physical examination:  Nursing notes reviewed; Vital signs and O2 SAT reviewed;  Constitutional: Well developed, Well nourished, Well hydrated, In no acute distress; Head:  Normocephalic, atraumatic; Eyes: EOMI, PERRL, No scleral icterus; ENMT: Mouth and pharynx normal, Mucous membranes moist; Neck: Supple, Full range of motion, No lymphadenopathy; Cardiovascular: Regular rate and rhythm, No gallop; Respiratory: Breath sounds clear & equal bilaterally, No wheezes.  Speaking full sentences with ease, Normal respiratory effort/excursion; Chest: Nontender, Movement normal; Abdomen: Soft, Nontender, Nondistended, Normal bowel sounds; Genitourinary: No CVA tenderness; Extremities: Peripheral pulses normal, No tenderness, No edema, No calf edema or asymmetry.; Neuro: AA&Ox3, Major CN grossly intact.  Speech clear. No gross focal motor or sensory deficits in extremities.; Skin: Color normal, Warm, Dry.   ED Treatments / Results  Labs (all labs ordered are listed, but only abnormal results are displayed)   EKG EKG Interpretation  Date/Time:  Wednesday December 01 2017 14:28:30 EDT Ventricular Rate:  89 PR Interval:    QRS Duration: 149 QT Interval:  425 QTC Calculation: 518 R Axis:   -72 Text Interpretation:  Sinus rhythm Left axis deviation RBBB and LAFB When compared with ECG of 09/08/2017 No significant change was found Confirmed by Francine Graven [id number]) on 12/01/2017 3:02:04 PM   Radiology   Procedures Procedures (including critical care time)  Medications Ordered in ED Medications - No data to  display   Initial Impression / Assessment and Plan / ED Course  I have reviewed the triage vital signs and the nursing notes.  Pertinent labs & imaging results that were available during my care of the patient were reviewed by me and considered in my medical decision making (see chart for details).  MDM Reviewed: previous chart, nursing note and vitals Reviewed previous: labs and ECG Interpretation: labs, ECG and x-ray    Results for orders placed or performed during the hospital encounter of 07/26/74  Basic metabolic panel  Result Value Ref Range   Sodium 138 135 - 145 mmol/L   Potassium 4.0 3.5 - 5.1 mmol/L   Chloride 101 98 - 111 mmol/L   CO2 26 22 - 32 mmol/L   Glucose, Bld 373 (H) 70 - 99 mg/dL   BUN 18 8 - 23 mg/dL   Creatinine, Ser 1.25 (H) 0.44 - 1.00 mg/dL   Calcium 8.8 (L) 8.9 - 10.3 mg/dL   GFR calc non Af Amer 45 (L) >60 mL/min   GFR calc Af Amer 52 (L) >60 mL/min   Anion gap 11 5 - 15  CBC with Differential/Platelet  Result Value Ref Range   WBC 5.6 4.0 - 10.5 K/uL   RBC 3.88 3.87 - 5.11 MIL/uL   Hemoglobin 10.4 (L) 12.0 - 15.0 g/dL   HCT 34.0 (L) 36.0 - 46.0 %   MCV 87.6 80.0 - 100.0 fL   MCH 26.8 26.0 - 34.0 pg   MCHC 30.6 30.0 -  36.0 g/dL   RDW 14.3 11.5 - 15.5 %   Platelets 179 150 - 400 K/uL   nRBC 0.0 0.0 - 0.2 %   Neutrophils Relative % 73 %   Neutro Abs 4.1 1.7 - 7.7 K/uL   Lymphocytes Relative 19 %   Lymphs Abs 1.1 0.7 - 4.0 K/uL   Monocytes Relative 6 %   Monocytes Absolute 0.3 0.1 - 1.0 K/uL   Eosinophils Relative 2 %   Eosinophils Absolute 0.1 0.0 - 0.5 K/uL   Basophils Relative 0 %   Basophils Absolute 0.0 0.0 - 0.1 K/uL   Immature Granulocytes 0 %   Abs Immature Granulocytes 0.02 0.00 - 0.07 K/uL  Troponin I  Result Value Ref Range   Troponin I <0.03 <0.03 ng/mL   Dg Chest 2 View Result Date: 12/01/2017 CLINICAL DATA:  Shortness of breath. History of angioplasty. Syncopal episode while walking. History of asthma, diabetes, never  smoked. EXAM: CHEST - 2 VIEW COMPARISON:  PA and lateral chest x-ray of September 27, 2017 FINDINGS: The lungs are adequately inflated. There is no focal infiltrate. There is no pleural effusion. The heart and pulmonary vascularity are normal. The mediastinum is normal in width. There is calcification in the wall of the aortic arch. The bony thorax exhibits no acute abnormality. IMPRESSION: There is no CHF, pneumonia, nor other acute cardiopulmonary abnormality. Thoracic aortic atherosclerosis. Electronically Signed   By: David  Martinique M.D.   On: 12/01/2017 15:09    Ct Head Wo Contrast Result Date: 12/01/2017 CLINICAL DATA:  Dyspnea and dizziness after heart catheterization and angioplasty 2 weeks ago. EXAM: CT HEAD WITHOUT CONTRAST TECHNIQUE: Contiguous axial images were obtained from the base of the skull through the vertex without intravenous contrast. COMPARISON:  None. FINDINGS: Brain: Mild age related involutional changes of the brain. No evidence of acute infarction, hemorrhage, hydrocephalus, extra-axial collections or mass lesions. Midline fourth ventricle and basal cisterns without effacement. The brainstem and cerebellum appear intact. Vascular: No hyperdense vessel or unexpected calcification. Skull: Normal. Negative for fracture or focal lesion. Sinuses/Orbits: No acute finding. Other: None. IMPRESSION: Normal head CT for age. Electronically Signed   By: Ashley Royalty M.D.   On: 12/01/2017 18:11    Results for ARDETH, REPETTO (MRN [id number]) as of 12/01/2017 16:23  Ref. Range 09/03/2017 16:52 09/08/2017 03:46 09/23/2017 15:51 11/19/2017 16:42 12/01/2017 15:04  BUN Latest Ref Range: 8 - 23 mg/dL 24 17 20 20 18   Creatinine Latest Ref Range: 0.44 - 1.00 mg/dL 1.13 (H) 0.94 1.43 (H) 1.33 (H) 1.25 (H)    1600:  Not orthostatic on VS.  BUN/Cr within baseline. CBG elevated but pt not acidotic with normal AG.  T/C returned from Cards Dr. Harl Bowie, case discussed, including:  HPI, pertinent PM/SHx, VS/PE, dx  testing, ED course and treatment:  Agreeable to consult.   1750:  Cards consult completed: do not believe there is anything else from a cardiac testing standpoint to provide, recommend considering non-cardiac causes. Will obtain CT-H now and dose IVF for hyperglycemia (AG normal) and elevated BUN/Cr. Pt does need evaluation for PE but given IV contrast load 2 days ago, with pt's elevated BUN/Cr, hesitant to order CT-A chest today.  Dx and testing d/w pt.  Questions answered.  Verb understanding, agreeable to admit.  T/C returned from [facility] Dr. Denton Brick, case discussed, including:  HPI, pertinent PM/SHx, VS/PE, dx testing, ED course and treatment:  Agreeable to admit.     Final Clinical Impressions(s) / ED Diagnoses  Final diagnoses:  None    ED Discharge Orders    None       Francine Graven, DO 12/02/17 1332

## 2017-12-01 NOTE — Telephone Encounter (Signed)
Per the patient she states that she cannot take more than 7 steps without getting SOB, she believes it is related to the effient. No appointments are available for the patient to be seen today, the patient was advised to go to the ED as he symptoms should be addressed. Patient was agreeable.

## 2017-12-01 NOTE — ED Triage Notes (Signed)
PT had angioplasty 2 weeks ago and heart cath Monday.  Reports sob and dizziness when she stands.  Reports chest pain.  CBG 323, ems started IV.

## 2017-12-01 NOTE — Telephone Encounter (Signed)
Pt c/o medication issue:  1. Name of Medication: Prasugrel  2. How are you currently taking this medication (dosage and times per day)?  1 time a day  3. Are you having a reaction (difficulty breathing--STAT)? Chest Pain and SOB- not right now  4. What is your medication issue? Real SOB when she stands up and walk-, same reaction she had Brilinta

## 2017-12-01 NOTE — Progress Notes (Addendum)
Chart reviewed. History of PCI to LAD in 2005. Has had some issues with recurrent ISR requiring additional procedures on her LAD. Changed from brillinta to effient due to side effects  Seen in clinic 11/19/17 with chest pain similar to her prior angina. Better with SL NG  Sent for cath 11/26/17 with patent LAD stents, no obstructive disease. Presents to ER today with chest pain. Troponin is negative. EKG unchanged.   Normal cath just 5 days ago. No objecetive evidence of ischemia by EKG or enzymes, we have nothing additional from cardiac testing standpoint to provide. Imdur stopped previously due to headaches. Can start norvasc 2.5mg  daily as additional antianginal. Pain control per ER staff, consider noncardiac causes of chest pain.    Zandra Abts MD

## 2017-12-01 NOTE — H&P (Addendum)
History and Physical    [name] TML:[id number] DOB: 10/10/1955 DOA: 12/01/2017  PCP: Sinda Du, MD   Patient coming from: home  Chief Complaint: Chest pain  HPI: [name] is a 62 y.o. female with medical history significant for CAD s/p PCI 2005, 2019, sleep apnea, MDD, HLD, HTN, thyroid disease, who presented to the ED with complaints of left sided chest pain, of several weeks duration.  Patient states she takes a few steps and suddenly she has chest pain that radiates to her back with difficulty breathing and lightheadedness, of which resolves when she stops to rest.  This pain is worse with deep breathing and cough. Patient had PCI with stenting 09/2017, same symptoms.  She reports symptoms did not improve.  Had another cardiac cath 11/26/17, which was negative obstructive disease. For the past few weeks she has not been active because of her symptoms.  She denies lower extremity swelling or leg pain.  History of blood clots in lung and her mother.  ED Course: Stable vitals.  EKG- unchanged, troponin CBC BMP unremarkable.  Chest x-ray negative for acute abnormality.  Cardiology was consulted in the ED Dr. Olena Leatherwood other causes of her symptoms to be evaluated, she had a normal cath just 5 days ago.  Recommended starting Norvasc as antianginal.  No Imdur as patient has had headaches in the past.   Review of Systems: As per HPI all other systems reviewed and negative  Past Medical History:  Diagnosis Date  . Arthritis   . Asthma   . Chest pain   . Coronary artery disease    a. LAD PCI in 2005 b. cath in 2014 showed patent pLAD stent c. Cath 06/11/17 severe ISR of overlapping Cypher DES in pLAD s/p cutting ballon PTCA only; mild dLAD dz  . Diabetes mellitus   . Diabetic neuropathy ([place])   . GERD (gastroesophageal reflux disease)   . History of skin cancer   . Hypercholesteremia   . Hypertension   . Myocardial infarction ([place])   . Panic attack   . Sleep  apnea   . Thyroid disease     Past Surgical History:  Procedure Laterality Date  . ABDOMINAL HYSTERECTOMY    . BACK SURGERY    . CHOLECYSTECTOMY    . CORONARY ANGIOPLASTY WITH STENT PLACEMENT    . CORONARY BALLOON ANGIOPLASTY N/A 06/11/2017   Procedure: CORONARY BALLOON ANGIOPLASTY;  Surgeon: Leonie Man, MD;  Location: [place] [facility];  Service: Cardiovascular;  Laterality: N/A;  LAD  . CORONARY STENT INTERVENTION  09/07/2017   STENT SYNERGY DES 3X24  . CORONARY STENT INTERVENTION N/A 09/07/2017   Procedure: CORONARY STENT INTERVENTION;  Surgeon: Jettie Booze, MD;  Location: [place] [facility];  Service: Cardiovascular;  Laterality: N/A;  . ELBOW SURGERY    . KNEE SURGERY    . LEFT HEART CATH AND CORONARY ANGIOGRAPHY N/A 06/11/2017   Procedure: LEFT HEART CATH AND CORONARY ANGIOGRAPHY;  Surgeon: Leonie Man, MD;  Location: [place] [facility];  Service: Cardiovascular;  Laterality: N/A;  . LEFT HEART CATH AND CORONARY ANGIOGRAPHY N/A 09/07/2017   Procedure: LEFT HEART CATH AND CORONARY ANGIOGRAPHY;  Surgeon: Jettie Booze, MD;  Location: [place] [facility];  Service: Cardiovascular;  Laterality: N/A;  . LEFT HEART CATH AND CORONARY ANGIOGRAPHY N/A 11/26/2017   Procedure: LEFT HEART CATH AND CORONARY ANGIOGRAPHY;  Surgeon: Belva Crome, MD;  Location: [place] [facility];  Service: Cardiovascular;  Laterality:  N/A;  . LEFT HEART CATHETERIZATION WITH CORONARY ANGIOGRAM N/A 06/23/2012   Procedure: LEFT HEART CATHETERIZATION WITH CORONARY ANGIOGRAM;  Surgeon: Sinclair Grooms, MD;  Location: [facility];  Service: Cardiovascular;  Laterality: N/A;     reports that she has never smoked. She has never used smokeless tobacco. She reports that she does not drink alcohol or use drugs.  Allergies  Allergen Reactions  . Tape Other (See Comments)    Adhesive breaks me out (rash)  . Morphine Itching    Family History  Problem Relation Age of Onset  . COPD Mother   .  Heart disease Father   . Heart attack Father     Prior to Admission medications   Medication Sig Start Date End Date Taking? Authorizing Provider  aspirin EC 81 MG tablet Take 81 mg by mouth daily.   Yes [provider]  budesonide-formoterol (SYMBICORT) 160-4.5 MCG/ACT inhaler Inhale 2 puffs into the lungs 2 (two) times daily.   Yes [provider]  cyclobenzaprine (FLEXERIL) 10 MG tablet Take 10 mg by mouth 3 (three) times daily as needed for muscle spasms.   Yes [provider]  diazepam (VALIUM) 5 MG tablet Take 5 mg by mouth 2 (two) times daily.    Yes [provider]  diclofenac sodium (VOLTAREN) 1 % GEL Apply 2-4 g topically 4 (four) times daily as needed (for pain.).  04/08/17  Yes [provider]  DULoxetine (CYMBALTA) 20 MG capsule Start 20 mg daily for one week, then 40 mg daily Patient taking differently: Take 20 mg by mouth 2 (two) times daily.  11/09/17  Yes Hisada, Elie Goody, MD  fluticasone (FLONASE) 50 MCG/ACT nasal spray Place 1 spray into both nostrils 2 (two) times daily.   Yes [provider]  furosemide (LASIX) 40 MG tablet Take 40 mg by mouth daily.    Yes [provider]  hydrOXYzine (ATARAX/VISTARIL) 25 MG tablet Take 25 mg by mouth 2 (two) times daily as needed for itching.  05/05/17  Yes [provider]  insulin degludec (TRESIBA FLEXTOUCH) 100 UNIT/ML SOPN FlexTouch Pen Inject 50 Units into the skin daily.   Yes [provider]  insulin lispro (HUMALOG) 100 UNIT/ML injection Inject 10-20 Units into the skin 3 (three) times daily before meals. Sliding Scale Insulin   Yes [provider]  levothyroxine (SYNTHROID, LEVOTHROID) 125 MCG tablet Take 125 mcg by mouth daily before breakfast.   Yes [provider]  levothyroxine (SYNTHROID, LEVOTHROID) 137 MCG tablet Take 137 mcg by mouth daily before breakfast.   Yes [provider]  losartan (COZAAR) 25 MG tablet TAKE 1 TABLET  BY MOUTH EVERY DAY Patient taking differently: Take 25 mg by mouth daily.  10/22/17  Yes Bhagat, Bhavinkumar, PA  Magnesium Oxide 400 (240 Mg) MG TABS Take 400 mg by mouth 2 (two) times daily. 07/06/17  Yes [provider]  metoprolol succinate (TOPROL-XL) 25 MG 24 hr tablet Take 1 tablet (25 mg total) by mouth daily. 09/08/17  Yes Belva Crome, MD  mirtazapine (REMERON) 30 MG tablet Take 1 tablet (30 mg total) by mouth at bedtime. 11/09/17  Yes Hisada, Elie Goody, MD  nitroGLYCERIN (NITROSTAT) 0.4 MG SL tablet PLACE 1 TABLET (0.4 MG TOTAL) UNDER THE TONGUE EVERY 5 (FIVE) MINUTES AS NEEDED FOR CHEST PAIN. 09/03/17  Yes Isaiah Serge, NP  ondansetron (ZOFRAN) 4 MG tablet Take 4 mg by mouth 3 (three) times daily as needed for nausea or vomiting.  05/22/17  Yes [provider]  Oxycodone HCl 10 MG TABS Take 10 mg by mouth 3 (three) times daily.   Yes [provider]  pantoprazole (PROTONIX) 40 MG tablet Take 1 tablet (40 mg total) by mouth daily. 10/13/17  Yes Bhagat, Bhavinkumar, PA  PARoxetine (PAXIL) 20 MG tablet Take 20 mg by mouth daily.   Yes [provider]  potassium chloride SA (K-DUR,KLOR-CON) 20 MEQ tablet Take 20 mEq by mouth daily.    Yes [provider]  prasugrel (EFFIENT) 10 MG TABS tablet Take 1 tablet (10 mg total) by mouth daily. 09/29/17  Yes Isaiah Serge, NP  rosuvastatin (CRESTOR) 40 MG tablet Take 40 mg by mouth every morning.    Yes [provider]  tiotropium (SPIRIVA) 18 MCG inhalation capsule Place 18 mcg into inhaler and inhale daily.    Yes [provider]  Vitamin D, Ergocalciferol, (DRISDOL) 50000 units CAPS capsule Take 50,000 Units by mouth every Monday.   Yes [provider]  ciprofloxacin (CIPRO) 250 MG tablet Take 250 mg by mouth 2 (two) times daily. 7 day course completed on 11/30/2017 11/25/17   [provider]    Physical Exam: Vitals:   12/01/17 1900 12/01/17 1930 12/01/17 1943 12/01/17  2000  BP: (!) 163/90 (!) 161/69 (!) 161/69 (!) 134/53  Pulse:   85 85  Resp: 14 19 16 13   Temp:      TempSrc:      SpO2: 96% 96% 96% 95%  Weight:      Height:        Constitutional: NAD, calm, comfortable Vitals:   12/01/17 1900 12/01/17 1930 12/01/17 1943 12/01/17 2000  BP: (!) 163/90 (!) 161/69 (!) 161/69 (!) 134/53  Pulse:   85 85  Resp: 14 19 16 13   Temp:      TempSrc:      SpO2: 96% 96% 96% 95%  Weight:      Height:       Eyes: PERRL, lids and conjunctivae normal ENMT: Mucous membranes are moist. Posterior pharynx clear of any exudate or lesions. Neck: normal, supple, no masses, no thyromegaly Respiratory: clear to auscultation bilaterally, no wheezing, no crackles. Normal respiratory effort. No accessory muscle use.  Cardiovascular: Regular rate and rhythm, no murmurs / rubs / gallops. No extremity edema. 2+ pedal pulses.  Abdomen: no tenderness, no masses palpated. No hepatosplenomegaly. Bowel sounds positive.  Musculoskeletal: no clubbing / cyanosis. No joint deformity upper and lower extremities. Good ROM, no contractures. Normal muscle tone.  Skin: no rashes, lesions, ulcers. No induration Neurologic: CN 2-12 grossly intact. Strength 5/5 in all 4.  Psychiatric: Normal judgment and insight. Alert and oriented x 3. Normal mood.   Labs on Admission: I have personally reviewed following labs and imaging studies  CBC: Recent Labs  Lab 12/01/17 1504  WBC 5.6  NEUTROABS 4.1  HGB 10.4*  HCT 34.0*  MCV 87.6  PLT 409   Basic Metabolic Panel: Recent Labs  Lab 12/01/17 1504  NA 138  K 4.0  CL 101  CO2 26  GLUCOSE 373*  BUN 18  CREATININE 1.25*  CALCIUM 8.8*   Cardiac Enzymes: Recent Labs  Lab 12/01/17 1504  TROPONINI <0.03   BNP (last 3 results) Recent Labs    09/23/17 1551  PROBNP 24   CBG: Recent Labs  Lab 11/26/17 0541 11/26/17 1239  GLUCAP 262* 144*    Radiological Exams on Admission: Dg Chest 2 View  Result Date:  12/01/2017  CLINICAL DATA:  Shortness of breath. History of angioplasty. Syncopal episode while walking. History of asthma, diabetes, never smoked. EXAM: CHEST - 2 VIEW COMPARISON:  PA and lateral chest x-ray of September 27, 2017 FINDINGS: The lungs are adequately inflated. There is no focal infiltrate. There is no pleural effusion. The heart and pulmonary vascularity are normal. The mediastinum is normal in width. There is calcification in the wall of the aortic arch. The bony thorax exhibits no acute abnormality. IMPRESSION: There is no CHF, pneumonia, nor other acute cardiopulmonary abnormality. Thoracic aortic atherosclerosis. Electronically Signed   By: David  Martinique M.D.   On: 12/01/2017 15:09   Ct Head Wo Contrast  Result Date: 12/01/2017 CLINICAL DATA:  Dyspnea and dizziness after heart catheterization and angioplasty 2 weeks ago. EXAM: CT HEAD WITHOUT CONTRAST TECHNIQUE: Contiguous axial images were obtained from the base of the skull through the vertex without intravenous contrast. COMPARISON:  None. FINDINGS: Brain: Mild age related involutional changes of the brain. No evidence of acute infarction, hemorrhage, hydrocephalus, extra-axial collections or mass lesions. Midline fourth ventricle and basal cisterns without effacement. The brainstem and cerebellum appear intact. Vascular: No hyperdense vessel or unexpected calcification. Skull: Normal. Negative for fracture or focal lesion. Sinuses/Orbits: No acute finding. Other: None. IMPRESSION: Normal head CT for age. Electronically Signed   By: Ashley Royalty M.D.   On: 12/01/2017 18:11    EKG: Independently reviewed. RBBB, LAFB.  Sinus rhythm unchanged from prior prolonged QTC 518  Assessment/Plan Principal Problem:   Chest pain Active Problems:   CAD S/P percutaneous coronary angioplasty   Hypertension   Thyroid disease   Sleep apnea   MDD (major depressive disorder), recurrent episode, moderate (HCC)   Chest pain with shortness of breath,  light headedness with exertion. Pleurisy.  CAD history, PCI to LAD 2005, and  then restent 09/2017.  Repeat cardiac cath 11/26/2017 nonobstructive CAD. Dr. Harl Bowie remains other etiologies for her symptoms be evaluated.  EKG unchanged troponin negative.  Brilinta changed to Effient for shortness of breath.  Symptoms concerning for PE. - Stat CTA chest ( No recent chest imaging)  -Hydrate normal saline 125 cc/h X 1 day contrast exposure - Hold Lasix and ARB -Continue aspirin and Effient -Pending CT results will start Norvasc 2.5mg  for anginal symptoms - BMP a.m  HTN-stable. -Continue home Toprol  Thyroid disease-patient on high doses of Synthroid. TSH 06/2017- 0.057 -Check TSH   CAD- S/p PCI to LAD. With none obstructive CAD 11/26/17.  Headaches with Imdur -Continue home aspirin and Effient -Suggest starting Norvasc 2.5 mg following cardiology recommendations as antianginal   DVT prophylaxis: heparin Code Status: Full Family Communication: none at bedside Disposition Plan: per rounding team Consults called: None Admission status: obs tele   Bethena Roys MD [facility] Pager [phone] From 3PM-11PM.  Otherwise please contact night-coverage [email or web address] Password [email or web address]  12/01/2017, 8:56 PM

## 2017-12-02 DIAGNOSIS — R079 Chest pain, unspecified: Secondary | ICD-10-CM

## 2017-12-02 DIAGNOSIS — R0789 Other chest pain: Secondary | ICD-10-CM | POA: Diagnosis not present

## 2017-12-02 LAB — BASIC METABOLIC PANEL
Anion gap: 7 (ref 5–15)
BUN: 17 mg/dL (ref 8–23)
CO2: 30 mmol/L (ref 22–32)
Calcium: 8.6 mg/dL — ABNORMAL LOW (ref 8.9–10.3)
Chloride: 102 mmol/L (ref 98–111)
Creatinine, Ser: 0.98 mg/dL (ref 0.44–1.00)
GFR calc Af Amer: >60 mL/min (ref >60)
GFR calc non Af Amer: >60 mL/min (ref >60)
Glucose, Bld: 124 mg/dL — ABNORMAL HIGH (ref 70–99)
Potassium: 4.2 mmol/L (ref 3.5–5.1)
Sodium: 139 mmol/L (ref 135–145)

## 2017-12-02 LAB — CBG MONITORING, ED: Glucose-Capillary: 177 mg/dL — ABNORMAL HIGH (ref 70–99)

## 2017-12-02 LAB — TSH: TSH: 12.392 u[IU]/mL — ABNORMAL HIGH (ref 0.350–4.500)

## 2017-12-02 LAB — TROPONIN I: Troponin I: <0.03 ng/mL (ref <0.03)

## 2017-12-02 MED ORDER — PNEUMOCOCCAL VAC POLYVALENT 25 MCG/0.5ML IJ INJ: 0.5000 mL | INJECTION | INTRAMUSCULAR | Status: DC

## 2017-12-02 MED ORDER — AMLODIPINE BESYLATE 5 MG PO TABS
2.5000 mg | ORAL_TABLET | Freq: Every day | ORAL | Status: DC
  Administered 2017-12-02: 2.5 mg via ORAL
  Filled 2017-12-02: qty 1

## 2017-12-02 MED ORDER — LOSARTAN POTASSIUM 25 MG PO TABS: 25.0000 mg | ORAL_TABLET | Freq: Every day | ORAL | Status: DC

## 2017-12-02 MED ORDER — FUROSEMIDE 40 MG PO TABS
40.0000 mg | ORAL_TABLET | Freq: Every day | ORAL | Status: DC
  Administered 2017-12-02: 40 mg via ORAL
  Filled 2017-12-02: qty 1

## 2017-12-02 MED ORDER — AMLODIPINE BESYLATE 2.5 MG PO TABS: 2.5000 mg | ORAL_TABLET | Freq: Every day | ORAL | 0 refills | Status: DC

## 2017-12-02 NOTE — Discharge Summary (Signed)
Physician Discharge Summary  [name] YPP:[id number] DOB: Jun 11, 1955 DOA: 12/01/2017  PCP: Sinda Du, MD  Admit date: 12/01/2017  Discharge date: 12/02/2017  Admitted From:Home  Disposition:  Home  Recommendations for Outpatient Follow-up:  1. Follow up with PCP in 1-2 weeks 2. Follow-up with cardiology as scheduled on 11/8 in [place] 3. Continue home medications as otherwise prescribed with addition of amlodipine 2.5 mg daily  Home Health: None  Equipment/Devices: None  Discharge Condition: Stable  CODE STATUS: Full  Diet recommendation: Heart Healthy  Brief/Interim Summary: Per HPI from Dr. Denton Brick on 10/30: [name] is a 62 y.o. female with medical history significant for CAD s/p PCI 2005, 2019, sleep apnea, MDD, HLD, HTN, thyroid disease, who presented to the ED with complaints of left sided chest pain, of several weeks duration.  Patient states she takes a few steps and suddenly she has chest pain that radiates to her back with difficulty breathing and lightheadedness, of which resolves when she stops to rest.  This pain is worse with deep breathing and cough. Patient had PCI with stenting 09/2017, same symptoms.  She reports symptoms did not improve.  Had another cardiac cath 11/26/17, which was negative obstructive disease. For the past few weeks she has not been active because of her symptoms.  She denies lower extremity swelling or leg pain.  History of blood clots in lung and her mother.  Patient did not have any findings of ACS and troponins have been negative.  She underwent CTA of the chest with no acute findings noted either.  She was seen by Dr. Harl Bowie of cardiology with recommendations to initiate amlodipine 2.5 mg daily to help with anginal chest pain and consider up titration of this medication as well as her beta-blocker.  She will follow-up with her cardiology office on 11/8 for further evaluation.  She was noted to have some elevated blood  pressure readings this morning with some associated, intermittent chest pain which have now resolved with decrease in blood pressure.  No other acute events noted during the course of this short admission.  Discharge Diagnoses:  Principal Problem:   Chest pain Active Problems:   CAD S/P percutaneous coronary angioplasty   Hypertension   Thyroid disease   Sleep apnea   MDD (major depressive disorder), recurrent episode, moderate (HCC)  Principal discharge diagnosis: Chronic angina in the setting of multivessel CAD.  Discharge Instructions  Discharge Instructions    Diet - low sodium heart healthy   Complete by:  As directed    Increase activity slowly   Complete by:  As directed      Allergies as of 12/02/2017      Reactions   Tape Other (See Comments)   Adhesive breaks me out (rash)   Morphine Itching      Medication List    STOP taking these medications   ciprofloxacin 250 MG tablet Commonly known as:  CIPRO     TAKE these medications   amLODipine 2.5 MG tablet Commonly known as:  NORVASC Take 1 tablet (2.5 mg total) by mouth daily. Start taking on:  12/03/2017   aspirin EC 81 MG tablet Take 81 mg by mouth daily.   budesonide-formoterol 160-4.5 MCG/ACT inhaler Commonly known as:  SYMBICORT Inhale 2 puffs into the lungs 2 (two) times daily.   cyclobenzaprine 10 MG tablet Commonly known as:  FLEXERIL Take 10 mg by mouth 3 (three) times daily as needed for muscle spasms.   diazepam 5 MG tablet  Commonly known as:  VALIUM Take 5 mg by mouth 2 (two) times daily.   diclofenac sodium 1 % Gel Commonly known as:  VOLTAREN Apply 2-4 g topically 4 (four) times daily as needed (for pain.).   DULoxetine 20 MG capsule Commonly known as:  CYMBALTA Start 20 mg daily for one week, then 40 mg daily What changed:    how much to take  how to take this  when to take this  additional instructions   fluticasone 50 MCG/ACT nasal spray Commonly known as:   FLONASE Place 1 spray into both nostrils 2 (two) times daily.   furosemide 40 MG tablet Commonly known as:  LASIX Take 40 mg by mouth daily.   hydrOXYzine 25 MG tablet Commonly known as:  ATARAX/VISTARIL Take 25 mg by mouth 2 (two) times daily as needed for itching.   insulin lispro 100 UNIT/ML injection Commonly known as:  HUMALOG Inject 10-20 Units into the skin 3 (three) times daily before meals. Sliding Scale Insulin   levothyroxine 125 MCG tablet Commonly known as:  SYNTHROID, LEVOTHROID Take 125 mcg by mouth daily before breakfast.   levothyroxine 137 MCG tablet Commonly known as:  SYNTHROID, LEVOTHROID Take 137 mcg by mouth daily before breakfast.   losartan 25 MG tablet Commonly known as:  COZAAR TAKE 1 TABLET BY MOUTH EVERY DAY   Magnesium Oxide 400 (240 Mg) MG Tabs Take 400 mg by mouth 2 (two) times daily.   metoprolol succinate 25 MG 24 hr tablet Commonly known as:  TOPROL-XL Take 1 tablet (25 mg total) by mouth daily.   mirtazapine 30 MG tablet Commonly known as:  REMERON Take 1 tablet (30 mg total) by mouth at bedtime.   nitroGLYCERIN 0.4 MG SL tablet Commonly known as:  NITROSTAT PLACE 1 TABLET (0.4 MG TOTAL) UNDER THE TONGUE EVERY 5 (FIVE) MINUTES AS NEEDED FOR CHEST PAIN.   ondansetron 4 MG tablet Commonly known as:  ZOFRAN Take 4 mg by mouth 3 (three) times daily as needed for nausea or vomiting.   Oxycodone HCl 10 MG Tabs Take 10 mg by mouth 3 (three) times daily.   pantoprazole 40 MG tablet Commonly known as:  PROTONIX Take 1 tablet (40 mg total) by mouth daily.   PARoxetine 20 MG tablet Commonly known as:  PAXIL Take 20 mg by mouth daily.   potassium chloride SA 20 MEQ tablet Commonly known as:  K-DUR,KLOR-CON Take 20 mEq by mouth daily.   prasugrel 10 MG Tabs tablet Commonly known as:  EFFIENT Take 1 tablet (10 mg total) by mouth daily.   rosuvastatin 40 MG tablet Commonly known as:  CRESTOR Take 40 mg by mouth every morning.    tiotropium 18 MCG inhalation capsule Commonly known as:  SPIRIVA Place 18 mcg into inhaler and inhale daily.   TRESIBA FLEXTOUCH 100 UNIT/ML Sopn FlexTouch Pen Generic drug:  insulin degludec Inject 50 Units into the skin daily.   Vitamin D (Ergocalciferol) 50000 units Caps capsule Commonly known as:  DRISDOL Take 50,000 Units by mouth every Monday.      Follow-up Information    Isaiah Serge, NP Follow up on 12/10/2017.   Specialties:  Cardiology, Radiology Why:  3:00 PM.  Contact information: [place] [address] [phone]        Sinda Du, MD Follow up in 2 week(s).   Specialty:  Pulmonary Disease Contact information: [place] [address] [phone]  Belva Crome, MD .   Specialty:  Cardiology Contact information: [id number] [place] [address] [phone]          Allergies  Allergen Reactions  . Tape Other (See Comments)    Adhesive breaks me out (rash)  . Morphine Itching    Consultations:  Cardiology   Procedures/Studies: Dg Chest 2 View  Result Date: 12/01/2017 CLINICAL DATA:  Shortness of breath. History of angioplasty. Syncopal episode while walking. History of asthma, diabetes, never smoked. EXAM: CHEST - 2 VIEW COMPARISON:  PA and lateral chest x-ray of September 27, 2017 FINDINGS: The lungs are adequately inflated. There is no focal infiltrate. There is no pleural effusion. The heart and pulmonary vascularity are normal. The mediastinum is normal in width. There is calcification in the wall of the aortic arch. The bony thorax exhibits no acute abnormality. IMPRESSION: There is no CHF, pneumonia, nor other acute cardiopulmonary abnormality. Thoracic aortic atherosclerosis. Electronically Signed   By: David  Martinique M.D.   On: 12/01/2017 15:09   Ct Head Wo Contrast  Result Date: 12/01/2017 CLINICAL DATA:  Dyspnea and dizziness after heart  catheterization and angioplasty 2 weeks ago. EXAM: CT HEAD WITHOUT CONTRAST TECHNIQUE: Contiguous axial images were obtained from the base of the skull through the vertex without intravenous contrast. COMPARISON:  None. FINDINGS: Brain: Mild age related involutional changes of the brain. No evidence of acute infarction, hemorrhage, hydrocephalus, extra-axial collections or mass lesions. Midline fourth ventricle and basal cisterns without effacement. The brainstem and cerebellum appear intact. Vascular: No hyperdense vessel or unexpected calcification. Skull: Normal. Negative for fracture or focal lesion. Sinuses/Orbits: No acute finding. Other: None. IMPRESSION: Normal head CT for age. Electronically Signed   By: Ashley Royalty M.D.   On: 12/01/2017 18:11   Ct Angio Chest Pe W Or Wo Contrast  Result Date: 12/01/2017 CLINICAL DATA:  Chest pain and shortness of breath for 2 months. EXAM: CT ANGIOGRAPHY CHEST WITH CONTRAST TECHNIQUE: Multidetector CT imaging of the chest was performed using the standard protocol during bolus administration of intravenous contrast. Multiplanar CT image reconstructions and MIPs were obtained to evaluate the vascular anatomy. CONTRAST:  27mL ISOVUE-370 IOPAMIDOL (ISOVUE-370) INJECTION 76% COMPARISON:  Chest CT 06/03/2004 FINDINGS: Cardiovascular: --Pulmonary arteries: Contrast injection is sufficient to demonstrate satisfactory opacification of the pulmonary arteries to the segmental level. There is no pulmonary embolus. The main pulmonary artery is within normal limits for size. --Aorta: Satisfactory opacification of the thoracic aorta. No aortic dissection or other acute aortic syndrome. Normal variant aortic arch branching pattern with the brachiocephalic and left common carotid arteries sharing a common origin. The aortic course and caliber are normal. There is no aortic atherosclerosis. --Heart: Normal size. No pericardial effusion. Mediastinum/Nodes: No mediastinal, hilar or  axillary lymphadenopathy. The visualized thyroid and thoracic esophageal course are unremarkable. Lungs/Pleura: No pulmonary nodules or masses. Faint ground-glass opacities may indicate mild pulmonary edema. No pleural effusion or pneumothorax. No focal airspace consolidation. No focal pleural abnormality. Upper Abdomen: Contrast bolus timing is not optimized for evaluation of the abdominal organs. Within this limitation, the visualized organs of the upper abdomen are normal. Musculoskeletal: No chest wall abnormality. No acute or significant osseous findings. Review of the MIP images confirms the above findings. IMPRESSION: No pulmonary embolus or acute aortic syndrome. Electronically Signed   By: Ulyses Jarred M.D.   On: 12/01/2017 21:26     Discharge Exam: Vitals:   12/02/17 1130 12/02/17 1152  BP: 140/72  Pulse: 75   Resp: 10   Temp:    SpO2: 97% 97%   Vitals:   12/02/17 1100 12/02/17 1115 12/02/17 1130 12/02/17 1152  BP:   140/72   Pulse: 79 75 75   Resp: 14 15 10    Temp:      TempSrc:      SpO2: 98% 94% 97% 97%  Weight:      Height:        General: Pt is alert, awake, not in acute distress Cardiovascular: RRR, S1/S2 +, no rubs, no gallops Respiratory: CTA bilaterally, no wheezing, no rhonchi Abdominal: Soft, NT, ND, bowel sounds + Extremities: no edema, no cyanosis    The results of significant diagnostics from this hospitalization (including imaging, microbiology, ancillary and laboratory) are listed below for reference.     Microbiology: No results found for this or any previous visit (from the past 240 hour(s)).   Labs: BNP (last 3 results) No results for input(s): BNP in the last 8760 hours. Basic Metabolic Panel: Recent Labs  Lab 12/01/17 1504 12/02/17 0320  NA 138 139  K 4.0 4.2  CL 101 102  CO2 26 30  GLUCOSE 373* 124*  BUN 18 17  CREATININE 1.25* 0.98  CALCIUM 8.8* 8.6*   Liver Function Tests: No results for input(s): AST, ALT, ALKPHOS, BILITOT,  PROT, ALBUMIN in the last 168 hours. No results for input(s): LIPASE, AMYLASE in the last 168 hours. No results for input(s): AMMONIA in the last 168 hours. CBC: Recent Labs  Lab 12/01/17 1504  WBC 5.6  NEUTROABS 4.1  HGB 10.4*  HCT 34.0*  MCV 87.6  PLT 179   Cardiac Enzymes: Recent Labs  Lab 12/01/17 1504 12/01/17 2115 12/02/17 0320  TROPONINI <0.03 <0.03 <0.03   BNP: Invalid input(s): POCBNP CBG: Recent Labs  Lab 11/26/17 0541 11/26/17 1239 12/01/17 2221 12/02/17 0847  GLUCAP 262* 144* 211* 177*   D-Dimer No results for input(s): DDIMER in the last 72 hours. Hgb A1c No results for input(s): HGBA1C in the last 72 hours. Lipid Profile No results for input(s): CHOL, HDL, LDLCALC, TRIG, CHOLHDL, LDLDIRECT in the last 72 hours. Thyroid function studies Recent Labs    12/02/17 0320  TSH 12.392*   Anemia work up No results for input(s): VITAMINB12, FOLATE, FERRITIN, TIBC, IRON, RETICCTPCT in the last 72 hours. Urinalysis No results found for: COLORURINE, APPEARANCEUR, LABSPEC, [place], GLUCOSEU, HGBUR, BILIRUBINUR, KETONESUR, PROTEINUR, UROBILINOGEN, NITRITE, LEUKOCYTESUR Sepsis Labs Invalid input(s): PROCALCITONIN,  WBC,  LACTICIDVEN Microbiology No results found for this or any previous visit (from the past 240 hour(s)).   Time coordinating discharge: 35 minutes  SIGNED:   Rodena Goldmann, DO [facility] 12/02/2017, 12:36 PM Pager [phone]  If 7PM-7AM, please contact night-coverage [email or web address] Password [email or web address]

## 2017-12-02 NOTE — Progress Notes (Signed)
Waiting on meds from pharmacy 

## 2017-12-06 ENCOUNTER — Other Ambulatory Visit (HOSPITAL_COMMUNITY): Payer: Self-pay | Admitting: Psychiatry

## 2017-12-06 MED ORDER — DULOXETINE HCL 20 MG PO CPEP: 40.0000 mg | ORAL_CAPSULE | Freq: Every day | ORAL | 0 refills | Status: DC

## 2017-12-06 MED ORDER — DULOXETINE HCL 40 MG PO CPEP: 40.0000 mg | ORAL_CAPSULE | Freq: Every day | ORAL | 0 refills | Status: DC

## 2017-12-08 NOTE — Progress Notes (Deleted)
[place] MD/PA/NP OP Progress Note  12/08/2017 4:18 PM [name]  MRN:  [id number]  Chief Complaint:  HPI:   Discharged from ED for chest pain  Visit Diagnosis: No diagnosis found.  Past Psychiatric History: Please see initial evaluation for full details. I have reviewed the history. No updates at this time.     Past Medical History:  Past Medical History:  Diagnosis Date  . Arthritis   . Asthma   . Chest pain   . Coronary artery disease    a. LAD PCI in 2005 b. cath in 2014 showed patent pLAD stent c. Cath 06/11/17 severe ISR of overlapping Cypher DES in pLAD s/p cutting ballon PTCA only; mild dLAD dz  . Diabetes mellitus   . Diabetic neuropathy ([place])   . GERD (gastroesophageal reflux disease)   . History of skin cancer   . Hypercholesteremia   . Hypertension   . Myocardial infarction ([place])   . Panic attack   . Sleep apnea   . Thyroid disease     Past Surgical History:  Procedure Laterality Date  . ABDOMINAL HYSTERECTOMY    . BACK SURGERY    . CHOLECYSTECTOMY    . CORONARY ANGIOPLASTY WITH STENT PLACEMENT    . CORONARY BALLOON ANGIOPLASTY N/A 06/11/2017   Procedure: CORONARY BALLOON ANGIOPLASTY;  Surgeon: Leonie Man, MD;  Location: [facility];  Service: Cardiovascular;  Laterality: N/A;  LAD  . CORONARY STENT INTERVENTION  09/07/2017   STENT SYNERGY DES 3X24  . CORONARY STENT INTERVENTION N/A 09/07/2017   Procedure: CORONARY STENT INTERVENTION;  Surgeon: Jettie Booze, MD;  Location: [facility];  Service: Cardiovascular;  Laterality: N/A;  . ELBOW SURGERY    . KNEE SURGERY    . LEFT HEART CATH AND CORONARY ANGIOGRAPHY N/A 06/11/2017   Procedure: LEFT HEART CATH AND CORONARY ANGIOGRAPHY;  Surgeon: Leonie Man, MD;  Location: [facility];  Service: Cardiovascular;  Laterality: N/A;  . LEFT HEART CATH AND CORONARY ANGIOGRAPHY N/A 09/07/2017   Procedure: LEFT HEART CATH AND CORONARY ANGIOGRAPHY;  Surgeon: Jettie Booze, MD;   Location: [facility];  Service: Cardiovascular;  Laterality: N/A;  . LEFT HEART CATH AND CORONARY ANGIOGRAPHY N/A 11/26/2017   Procedure: LEFT HEART CATH AND CORONARY ANGIOGRAPHY;  Surgeon: Belva Crome, MD;  Location: [facility];  Service: Cardiovascular;  Laterality: N/A;  . LEFT HEART CATHETERIZATION WITH CORONARY ANGIOGRAM N/A 06/23/2012   Procedure: LEFT HEART CATHETERIZATION WITH CORONARY ANGIOGRAM;  Surgeon: Sinclair Grooms, MD;  Location: [facility];  Service: Cardiovascular;  Laterality: N/A;    Family Psychiatric History: Please see initial evaluation for full details. I have reviewed the history. No updates at this time.     Family History:  Family History  Problem Relation Age of Onset  . COPD Mother   . Heart disease Father   . Heart attack Father     Social History:  Social History   Socioeconomic History  . Marital status: Divorced    Spouse name: Not on file  . Number of children: Not on file  . Years of education: Not on file  . Highest education level: Not on file  Occupational History  . Not on file  Social Needs  . Financial resource strain: Not on file  . Food insecurity:    Worry: Not on file    Inability: Not on file  . Transportation needs:    Medical:  Not on file    Non-medical: Not on file  Tobacco Use  . Smoking status: Never Smoker  . Smokeless tobacco: Never Used  Substance and Sexual Activity  . Alcohol use: No    Alcohol/week: 0.0 standard drinks  . Drug use: No  . Sexual activity: Not on file  Lifestyle  . Physical activity:    Days per week: Not on file    Minutes per session: Not on file  . Stress: Not on file  Relationships  . Social connections:    Talks on phone: Not on file    Gets together: Not on file    Attends religious service: Not on file    Active member of club or organization: Not on file    Attends meetings of clubs or organizations: Not on file    Relationship status: Not on file  Other  Topics Concern  . Not on file  Social History Narrative  . Not on file    Allergies:  Allergies  Allergen Reactions  . Tape Other (See Comments)    Adhesive breaks me out (rash)  . Morphine Itching    Metabolic Disorder Labs: No results found for: HGBA1C, MPG No results found for: PROLACTIN No results found for: CHOL, TRIG, HDL, CHOLHDL, VLDL, LDLCALC Lab Results  Component Value Date   TSH 12.392 (H) 12/02/2017   TSH 0.057 (L) 06/10/2017    Therapeutic Level Labs: No results found for: LITHIUM No results found for: VALPROATE No components found for:  CBMZ  Current Medications: Current Outpatient Medications  Medication Sig Dispense Refill  . amLODipine (NORVASC) 2.5 MG tablet Take 1 tablet (2.5 mg total) by mouth daily. 30 tablet 0  . aspirin EC 81 MG tablet Take 81 mg by mouth daily.    . budesonide-formoterol (SYMBICORT) 160-4.5 MCG/ACT inhaler Inhale 2 puffs into the lungs 2 (two) times daily.    . cyclobenzaprine (FLEXERIL) 10 MG tablet Take 10 mg by mouth 3 (three) times daily as needed for muscle spasms.    . diazepam (VALIUM) 5 MG tablet Take 5 mg by mouth 2 (two) times daily.     . diclofenac sodium (VOLTAREN) 1 % GEL Apply 2-4 g topically 4 (four) times daily as needed (for pain.).   0  . DULoxetine (CYMBALTA) 20 MG capsule Take 2 capsules (40 mg total) by mouth daily. 60 capsule 0  . fluticasone (FLONASE) 50 MCG/ACT nasal spray Place 1 spray into both nostrils 2 (two) times daily.    . furosemide (LASIX) 40 MG tablet Take 40 mg by mouth daily.     . hydrOXYzine (ATARAX/VISTARIL) 25 MG tablet Take 25 mg by mouth 2 (two) times daily as needed for itching.   5  . insulin degludec (TRESIBA FLEXTOUCH) 100 UNIT/ML SOPN FlexTouch Pen Inject 50 Units into the skin daily.    . insulin lispro (HUMALOG) 100 UNIT/ML injection Inject 10-20 Units into the skin 3 (three) times daily before meals. Sliding Scale Insulin    . levothyroxine (SYNTHROID, LEVOTHROID) 125 MCG tablet  Take 125 mcg by mouth daily before breakfast.    . levothyroxine (SYNTHROID, LEVOTHROID) 137 MCG tablet Take 137 mcg by mouth daily before breakfast.    . losartan (COZAAR) 25 MG tablet TAKE 1 TABLET BY MOUTH EVERY DAY (Patient taking differently: Take 25 mg by mouth daily. ) 90 tablet 3  . Magnesium Oxide 400 (240 Mg) MG TABS Take 400 mg by mouth 2 (two) times daily.  5  . metoprolol  succinate (TOPROL-XL) 25 MG 24 hr tablet Take 1 tablet (25 mg total) by mouth daily. 90 tablet 3  . mirtazapine (REMERON) 30 MG tablet Take 1 tablet (30 mg total) by mouth at bedtime. 30 tablet 0  . nitroGLYCERIN (NITROSTAT) 0.4 MG SL tablet PLACE 1 TABLET (0.4 MG TOTAL) UNDER THE TONGUE EVERY 5 (FIVE) MINUTES AS NEEDED FOR CHEST PAIN. 25 tablet 3  . ondansetron (ZOFRAN) 4 MG tablet Take 4 mg by mouth 3 (three) times daily as needed for nausea or vomiting.   3  . Oxycodone HCl 10 MG TABS Take 10 mg by mouth 3 (three) times daily.    . pantoprazole (PROTONIX) 40 MG tablet Take 1 tablet (40 mg total) by mouth daily. 90 tablet 3  . potassium chloride SA (K-DUR,KLOR-CON) 20 MEQ tablet Take 20 mEq by mouth daily.     . prasugrel (EFFIENT) 10 MG TABS tablet Take 1 tablet (10 mg total) by mouth daily. 90 tablet 3  . rosuvastatin (CRESTOR) 40 MG tablet Take 40 mg by mouth every morning.     . tiotropium (SPIRIVA) 18 MCG inhalation capsule Place 18 mcg into inhaler and inhale daily.     . Vitamin D, Ergocalciferol, (DRISDOL) 50000 units CAPS capsule Take 50,000 Units by mouth every Monday.     No current facility-administered medications for this visit.      Musculoskeletal: Strength & Muscle Tone: within normal limits Gait & Station: normal Patient leans: N/A  Psychiatric Specialty Exam: ROS  There were no vitals taken for this visit.There is no height or weight on file to calculate BMI.  General Appearance: Fairly Groomed  Eye Contact:  Good  Speech:  Clear and Coherent  Volume:  Normal  Mood:  {BHH MOOD:22306}   Affect:  {Affect (PAA):22687}  Thought Process:  Coherent  Orientation:  Full (Time, Place, and Person)  Thought Content: Logical   Suicidal Thoughts:  {ST/HT (PAA):22692}  Homicidal Thoughts:  {ST/HT (PAA):22692}  Memory:  Immediate;   Good  Judgement:  {Judgement (PAA):22694}  Insight:  {Insight (PAA):22695}  Psychomotor Activity:  Normal  Concentration:  Concentration: Good and Attention Span: Good  Recall:  Good  Fund of Knowledge: Good  Language: Good  Akathisia:  No  Handed:  Right  AIMS (if indicated): not done  Assets:  Communication Skills Desire for Improvement  ADL's:  Intact  Cognition: WNL  Sleep:  {BHH GOOD/FAIR/POOR:22877}   Screenings:   Assessment and Plan:  [name] is a 62 y.o. year old female with a history of depression, CAD,  hypertension, hyperlipidemia, diabetes, asthma, severe OSA, hypothyroidism by history , who presents for follow up appointment for No diagnosis found.  # MDD, moderate, recurrent without psychotic features # r/o PTSD  Patient endorses neurovegetative symptoms, which has worsened over the past few months after witnessing the death of her significant other.  Other psychosocial stressors including medical condition, and loneliness.  Will switch from Paxil to duloxetine to target depression.  Discussed potential risk of serotonin syndrome, and hypertension.  Will continue mirtazapine at this time as adjunctive treatment for depression.  Will consider tapering it off in the future if no significant benefit from this medication.  Although she may benefit from prazosin in the future for nightmares, will hold this at this time given she is on antihypertensive medication.  Discussed behavioral activation.  She will greatly benefit from CBT; will make a referral.   Plan 1. Decrease Paxil 10 mg daily for one week, then  discontinue  2. Start duloxetine 20 mg daily for one week, then 40 mg daily  3. Continue mirtazapine 30 mg at night   4. Return to clinic in one month for 30 mins 5. Referral to therapy  - she is on valium prescribed by her PCP - she receives treatment for hypothyroidism from endocrinologist in [place]  The patient demonstrates the following risk factors for suicide: Chronic risk factors for suicide include: psychiatric disorder of depression and chronic pain. Acute risk factors for suicide include: unemployment and loss (financial, interpersonal, professional). Protective factors for this patient include: hope for the future. Considering these factors, the overall suicide risk at this point appears to be low. Patient is appropriate for outpatient follow up.   Norman Clay, MD 12/08/2017, 4:18 PM

## 2017-12-10 ENCOUNTER — Ambulatory Visit (INDEPENDENT_AMBULATORY_CARE_PROVIDER_SITE_OTHER): Payer: Medicare Other | Admitting: Cardiology

## 2017-12-10 ENCOUNTER — Encounter: Payer: Self-pay | Admitting: Cardiology

## 2017-12-10 VITALS — BP 96/56 | HR 80 | Ht 64.0 in | Wt 219.0 lb

## 2017-12-10 DIAGNOSIS — H524 Presbyopia: Secondary | ICD-10-CM | POA: Diagnosis not present

## 2017-12-10 DIAGNOSIS — Z9861 Coronary angioplasty status: Secondary | ICD-10-CM

## 2017-12-10 DIAGNOSIS — R079 Chest pain, unspecified: Secondary | ICD-10-CM

## 2017-12-10 DIAGNOSIS — E782 Mixed hyperlipidemia: Secondary | ICD-10-CM

## 2017-12-10 DIAGNOSIS — R0989 Other specified symptoms and signs involving the circulatory and respiratory systems: Secondary | ICD-10-CM | POA: Diagnosis not present

## 2017-12-10 DIAGNOSIS — R55 Syncope and collapse: Secondary | ICD-10-CM | POA: Diagnosis not present

## 2017-12-10 DIAGNOSIS — Z794 Long term (current) use of insulin: Secondary | ICD-10-CM

## 2017-12-10 DIAGNOSIS — I739 Peripheral vascular disease, unspecified: Secondary | ICD-10-CM

## 2017-12-10 DIAGNOSIS — I2510 Atherosclerotic heart disease of native coronary artery without angina pectoris: Secondary | ICD-10-CM

## 2017-12-10 DIAGNOSIS — H52223 Regular astigmatism, bilateral: Secondary | ICD-10-CM | POA: Diagnosis not present

## 2017-12-10 DIAGNOSIS — E039 Hypothyroidism, unspecified: Secondary | ICD-10-CM

## 2017-12-10 DIAGNOSIS — E119 Type 2 diabetes mellitus without complications: Secondary | ICD-10-CM

## 2017-12-10 NOTE — Patient Instructions (Addendum)
Medication Instructions:  Your physician recommends that you continue on your current medications as directed. Please refer to the Current Medication list given to you today.  If you need a refill on your cardiac medications before your next appointment, please call your pharmacy.   Lab work: NONE If you have labs (blood work) drawn today and your tests are completely normal, you will receive your results only by: Marland Kitchen MyChart Message (if you have MyChart) OR . A paper copy in the mail If you have any lab test that is abnormal or we need to change your treatment, we will call you to review the results.  Testing/Procedures: Your physician has recommended that you wear an event monitor. Event monitors are medical devices that record the heart's electrical activity. Doctors most often [place] these monitors to diagnose arrhythmias. Arrhythmias are problems with the speed or rhythm of the heartbeat. The monitor is a small, portable device. You can wear one while you do your normal daily activities. This is usually used to diagnose what is causing palpitations/syncope (passing out).  Your physician has requested that you have a renal artery duplex. During this test, an ultrasound is used to evaluate blood flow to the kidneys. Allow one hour for this exam. Do not eat after midnight the day before and avoid carbonated beverages. Take your medications as you usually do.  Your physician has requested that you have a lower extremity arterial duplex. This test is an ultrasound of the arteries in the legs. It looks at arterial blood flow in the legs. Allow one hour for Lower Arterial scans. There are no restrictions or special instructions  Follow-Up:  Your physician recommends that you schedule a follow-up appointment in: approx 2-4 weeks .  We will contact you with availability/appointment information.  Any Other Special Instructions Will Be Listed Below (If Applicable). You have been referred to [facility]  Neurology

## 2017-12-10 NOTE — Progress Notes (Signed)
Cardiology Office Note   Date:  12/12/2017   ID:  [name], [name] [birth year], MRN [id number]  PCP:  Sinda Du, MD  Cardiologist:  Dr. Tamala Julian    Chief Complaint  Patient presents with  . Hospitalization Follow-up    post cath and then post hospitalization for chest pain.      History of Present Illness: [name] is a 62 y.o. female who presents for post cath and post hospitalization.   She has had stents placed to Ost to Prox LAD for 90% stenosis. DES this was at site of prior PTCA.   Non Responder to Plavix. Was on Brilinta but did not tolerate now on effient.    She has a hx of asthma, DM-2, GERD, HLD, panic attacks, sleep apnea, thyroid disease and  Hx of CAD with PCI to LAD in 2005 And in 06/2017 she had admit wityh chest pain and near syncope and under went cardiac cath and had severe ISR of overlapping cyper DES in p-mLAD and underwent scoring balloon angioplasty and PTCA. She also had RBBB on EKG that was new. Also with orthostatic hypotension. she had cardiac cath and had restenosis of LAD and underwent stent placed to Ost to Prox LAD for 90% stenosis. DES this was at site of prior PTCA.    09/23/17 she still has some chest pain, one spot on Lt ant chest and is tender to palpation. She is having eye blurring and SOB.   Also headache in back of her head. She is worried the site may be closing up. We discussed now with stent less likely this will happen. Her symptoms are similar to when she left the hospital. Also BP at home is very high at times. We stopped the imdur , CXR was normal and labs were stable including of 24.  Discussed with Dr. Tamala Julian and we chanaged Brilinta to Effient after load with effient.  On last visit she tells me she is having the same symptoms again.  Black spots with she walks and chest pain.  It does waken her from sleep at night.,  She takes NTG with relief.   She has been taking the effient.    She is dyspneic  with the pain as well.   On last visit her glucose was 500 - she has seen Dr. Bubba Camp back and her meds have been adjusted.   Her glucose is labile at times.  No syncope.      Cardiac cath was arranged and her LAD stent was patent.  Phase II cardiac rehab was recommended.  Continue medications.    She was admitted 12/01/17 for lt sided chest pain.  Pain resolves with rest.   Imdur was not stopped and amlodipine was started.  Dr. Harl Bowie saw in consult and did not believe cardiac pain.  CTA of chest was neg for PE and no acute aortic syndrome  TSH is 12.39 to see Dr. Bubba Camp  She is orthostatic today and we will stop imdur.  Her symptoms are the same despite patent stents.  She has SOB and syncope to near syncope.  Still with chest pain, she is on protonix.  She if frustrated and anxious, she lives alone.  She is afraid something will happen and she will be alone.  Past Medical History:  Diagnosis Date  . Arthritis   . Asthma   . Chest pain   . Coronary artery disease    a. LAD PCI in 2005 b. cath in  2014 showed patent pLAD stent c. Cath 06/11/17 severe ISR of overlapping Cypher DES in pLAD s/p cutting ballon PTCA only; mild dLAD dz  . Diabetes mellitus   . Diabetic neuropathy ([place])   . GERD (gastroesophageal reflux disease)   . History of skin cancer   . Hypercholesteremia   . Hypertension   . Myocardial infarction ([place])   . Panic attack   . Sleep apnea   . Thyroid disease     Past Surgical History:  Procedure Laterality Date  . ABDOMINAL HYSTERECTOMY    . BACK SURGERY    . CHOLECYSTECTOMY    . CORONARY ANGIOPLASTY WITH STENT PLACEMENT    . CORONARY BALLOON ANGIOPLASTY N/A 06/11/2017   Procedure: CORONARY BALLOON ANGIOPLASTY;  Surgeon: Leonie Man, MD;  Location: [place] [facility];  Service: Cardiovascular;  Laterality: N/A;  LAD  . CORONARY STENT INTERVENTION  09/07/2017   STENT SYNERGY DES 3X24  . CORONARY STENT INTERVENTION N/A 09/07/2017   Procedure: CORONARY STENT  INTERVENTION;  Surgeon: Jettie Booze, MD;  Location: [place] [facility];  Service: Cardiovascular;  Laterality: N/A;  . ELBOW SURGERY    . KNEE SURGERY    . LEFT HEART CATH AND CORONARY ANGIOGRAPHY N/A 06/11/2017   Procedure: LEFT HEART CATH AND CORONARY ANGIOGRAPHY;  Surgeon: Leonie Man, MD;  Location: [place] [facility];  Service: Cardiovascular;  Laterality: N/A;  . LEFT HEART CATH AND CORONARY ANGIOGRAPHY N/A 09/07/2017   Procedure: LEFT HEART CATH AND CORONARY ANGIOGRAPHY;  Surgeon: Jettie Booze, MD;  Location: [place] [facility];  Service: Cardiovascular;  Laterality: N/A;  . LEFT HEART CATH AND CORONARY ANGIOGRAPHY N/A 11/26/2017   Procedure: LEFT HEART CATH AND CORONARY ANGIOGRAPHY;  Surgeon: Belva Crome, MD;  Location: [place] [facility];  Service: Cardiovascular;  Laterality: N/A;  . LEFT HEART CATHETERIZATION WITH CORONARY ANGIOGRAM N/A 06/23/2012   Procedure: LEFT HEART CATHETERIZATION WITH CORONARY ANGIOGRAM;  Surgeon: Sinclair Grooms, MD;  Location: [facility];  Service: Cardiovascular;  Laterality: N/A;     Current Outpatient Medications  Medication Sig Dispense Refill  . amLODipine (NORVASC) 2.5 MG tablet Take 1 tablet (2.5 mg total) by mouth daily. 30 tablet 0  . aspirin EC 81 MG tablet Take 81 mg by mouth daily.    . budesonide-formoterol (SYMBICORT) 160-4.5 MCG/ACT inhaler Inhale 2 puffs into the lungs 2 (two) times daily.    . cyclobenzaprine (FLEXERIL) 10 MG tablet Take 10 mg by mouth 3 (three) times daily as needed for muscle spasms.    . diazepam (VALIUM) 5 MG tablet Take 5 mg by mouth 2 (two) times daily.     . diclofenac sodium (VOLTAREN) 1 % GEL Apply 2-4 g topically 4 (four) times daily as needed (for pain.).   0  . DULoxetine (CYMBALTA) 20 MG capsule Take 2 capsules (40 mg total) by mouth daily. 60 capsule 0  . fluconazole (DIFLUCAN) 150 MG tablet Place 150 mg into the nose See admin instructions. Patient reports 2 sprays in each nostril  5    . fluticasone (FLONASE) 50 MCG/ACT nasal spray Place 1 spray into both nostrils 2 (two) times daily.    . furosemide (LASIX) 40 MG tablet Take 40 mg by mouth daily.     . hydrOXYzine (ATARAX/VISTARIL) 25 MG tablet Take 25 mg by mouth 2 (two) times daily as needed for itching.   5  . insulin degludec (TRESIBA FLEXTOUCH) 100 UNIT/ML SOPN FlexTouch Pen Inject 50  Units into the skin daily.    . insulin lispro (HUMALOG) 100 UNIT/ML injection Inject 10-20 Units into the skin 3 (three) times daily before meals. Sliding Scale Insulin    . isosorbide mononitrate (IMDUR) 60 MG 24 hr tablet Take 60 mg by mouth as needed.  3  . levothyroxine (SYNTHROID, LEVOTHROID) 125 MCG tablet Take 125 mcg by mouth daily before breakfast.    . levothyroxine (SYNTHROID, LEVOTHROID) 137 MCG tablet Take 137 mcg by mouth daily before breakfast.    . losartan (COZAAR) 25 MG tablet TAKE 1 TABLET BY MOUTH EVERY DAY 90 tablet 3  . Magnesium Oxide 400 (240 Mg) MG TABS Take 400 mg by mouth 2 (two) times daily.  5  . metoprolol succinate (TOPROL-XL) 25 MG 24 hr tablet Take 1 tablet (25 mg total) by mouth daily. 90 tablet 3  . mirtazapine (REMERON) 30 MG tablet Take 1 tablet (30 mg total) by mouth at bedtime. 30 tablet 0  . nitroGLYCERIN (NITROSTAT) 0.4 MG SL tablet PLACE 1 TABLET (0.4 MG TOTAL) UNDER THE TONGUE EVERY 5 (FIVE) MINUTES AS NEEDED FOR CHEST PAIN. 25 tablet 3  . ondansetron (ZOFRAN) 4 MG tablet Take 4 mg by mouth 3 (three) times daily as needed for nausea or vomiting.   3  . Oxycodone HCl 10 MG TABS Take 10 mg by mouth 3 (three) times daily.    . pantoprazole (PROTONIX) 40 MG tablet Take 1 tablet (40 mg total) by mouth daily. 90 tablet 3  . potassium chloride SA (K-DUR,KLOR-CON) 20 MEQ tablet Take 20 mEq by mouth daily.     . prasugrel (EFFIENT) 10 MG TABS tablet Take 1 tablet (10 mg total) by mouth daily. 90 tablet 3  . rosuvastatin (CRESTOR) 40 MG tablet Take 40 mg by mouth every morning.     . tiotropium (SPIRIVA) 18  MCG inhalation capsule Place 18 mcg into inhaler and inhale daily.     . Vitamin D, Ergocalciferol, (DRISDOL) 50000 units CAPS capsule Take 50,000 Units by mouth every Monday.     No current facility-administered medications for this visit.     Allergies:   Tape and Morphine    Social History:  The patient  reports that she has never smoked. She has never used smokeless tobacco. She reports that she does not drink alcohol or use drugs.   Family History:  The patient's family history includes COPD in her mother; Heart attack in her father; Heart disease in her father.    ROS:  General:no colds or fevers, no weight changes Skin:no rashes or ulcers HEENT:no blurred vision, no congestion CV:see HPI PUL:see HPI GI:no diarrhea constipation or melena, no indigestion GU:no hematuria, no dysuria MS:no joint pain, no claudication Neuro:no syncope, no lightheadedness Endo:+ diabetes with elevated glucose followed by Dr. Bubba Camp to see in 2 weeks., + thyroid disease with elevated TSH again to see Dr. Scarlette Shorts Readings from Last 3 Encounters:  12/10/17 219 lb (99.3 kg)  12/01/17 205 lb (93 kg)  11/26/17 205 lb (93 kg)     PHYSICAL EXAM: VS:  BP (!) 96/56   Pulse 80   Ht 5\' 4"  (1.626 m)   Wt 219 lb (99.3 kg)   SpO2 98%   BMI 37.59 kg/m  , BMI Body mass index is 37.59 kg/m. General:Pleasant affect, NAD Skin:Warm and dry, brisk capillary refill HEENT:normocephalic, sclera clear, mucus membranes moist Neck:supple, no JVD, no bruits  Heart:S1S2 RRR without murmur, gallup, rub or click Lungs:clear without rales,  rhonchi, or wheezes PPI:RJJO, non tender, + BS, do not palpate liver spleen or masses Ext:no lower ext edema, 2+ pedal pulses, 2+ radial pulses Neuro:alert and oriented, X 3 MAE, follows commands, + facial symmetry  BP Lying 119/63, sitting 101/59, standing 103/58 and standing at 3 min 114/63  EKG:  EKG is NOT ordered today.  Recent Labs: 06/10/2017: Magnesium  1.5 06/11/2017: ALT 27 09/23/2017: NT-Pro BNP 24 12/01/2017: Hemoglobin 10.4; Platelets 179 12/02/2017: BUN 17; Creatinine, Ser 0.98; Potassium 4.2; Sodium 139; TSH 12.392    Lipid Panel No results found for: CHOL, TRIG, HDL, CHOLHDL, VLDL, LDLCALC, LDLDIRECT     Other studies Reviewed: Additional studies/ records that were reviewed today include: . Cardiac cath 11/26/17  The most recent LAD stent remains widely patent.  (She has had 2 previous LAD stents in the proximal LAD that were not overlapping.  In May she presented with gap high-grade stenosis.  A new stent was placed overlapping the distal with the proximal stent.)  Codominant anatomy  Luminal irregularities in the right coronary and PDA  Widely patent circumflex with distal luminal irregularities  Mildly elevated LVEDP  RECOMMENDATIONS:   May and July hospitalizations for similar symptoms did not lead to improvement.  Despite a widely patent LAD stent at this time, symptoms persist.  Suspect symptoms could be related to physical deconditioning.  Recommend phase 2 cardiac rehab.  Recommend dual antiplatelet therapy with Aspirin 81mg  daily and Prasugrel 10mg  daily long-term (beyond 12 months) because of Overlapping stents and preceding history of ISR.Marland Kitchen  ASSESSMENT AND PLAN:  1.  Chest pain and DOE despite patent stent.  We have recommended cardiac rehab.  Also to follow up with Dr. Luan Pulling , I discussed with Dr. Tamala Julian today need to look for other causes of symptoms than cardiac, no PE on recent CTA.   Anxiety may be playing role vs. anxiety present due to continued symptoms. .     2.  Syncope and near syncope with orthostatic hypotension, stopping imdur with patent stent and now on amlodipine. Will have her wear a monitor to eval for arrhythmias. Follow up with Dr. Tamala Julian in 6-8 weeks.  3.   CAD with recent stents, now patent on cath.  Needs cardiac rehab.   4.  Labile HTN will check renal artery dopplers   5.   She complains of upper thigh pain with ambulation will check bilateral arterial dopplers for PAD.  Current medicines are reviewed with the patient today.  The patient Has no concerns regarding medicines.  6.  Hypothyroid with elevated TSH to see Dr. Bubba Camp  7.  Diabetes, followed by Dr., Bubba Camp and glucose has been elevated, may be playing role in near syncope.    The following changes have been made:  See above Labs/ tests ordered today include:see above  Disposition:   FU:  see above  Signed, Cecilie Kicks, NP  12/12/2017 3:55 PM    [address], [address], [place], [place] Phone: [phone]; Fax: [phone]

## 2017-12-12 ENCOUNTER — Encounter: Payer: Self-pay | Admitting: Cardiology

## 2017-12-14 ENCOUNTER — Ambulatory Visit (HOSPITAL_COMMUNITY): Payer: Medicare Other | Admitting: Psychiatry

## 2017-12-17 ENCOUNTER — Other Ambulatory Visit: Payer: Self-pay | Admitting: Cardiology

## 2017-12-17 DIAGNOSIS — I739 Peripheral vascular disease, unspecified: Secondary | ICD-10-CM

## 2017-12-21 ENCOUNTER — Ambulatory Visit (HOSPITAL_COMMUNITY)
Admission: RE | Admit: 2017-12-21 | Payer: Medicare Other | Source: Ambulatory Visit | Attending: Cardiology | Admitting: Cardiology

## 2017-12-21 ENCOUNTER — Telehealth: Payer: Self-pay | Admitting: Interventional Cardiology

## 2017-12-21 NOTE — Telephone Encounter (Signed)
Attempted to contact pt.  Phone rings half a ring and goes straight to VM.  Left message for pt about location of [facility] office as she is technically not late at this time.  Advised if still unable to find building to call the office back and tell them she needs to reschedule for [place] or [place].

## 2017-12-21 NOTE — Telephone Encounter (Signed)
New Message:     Pt said she was scheduled for a Doppler at [facility] tjis morning and could not find ithe building. She wants to know if you schedule this appt in [place] or[place] for her please.

## 2017-12-21 NOTE — Telephone Encounter (Signed)
Pt rescheduled to 12/4 in [place]

## 2017-12-24 ENCOUNTER — Other Ambulatory Visit (HOSPITAL_COMMUNITY): Payer: Self-pay | Admitting: Psychiatry

## 2017-12-24 MED ORDER — MIRTAZAPINE 30 MG PO TABS: 30.0000 mg | ORAL_TABLET | Freq: Every day | ORAL | 0 refills | Status: DC

## 2017-12-28 DIAGNOSIS — I2510 Atherosclerotic heart disease of native coronary artery without angina pectoris: Secondary | ICD-10-CM | POA: Diagnosis not present

## 2017-12-28 DIAGNOSIS — I10 Essential (primary) hypertension: Secondary | ICD-10-CM | POA: Diagnosis not present

## 2017-12-28 DIAGNOSIS — J449 Chronic obstructive pulmonary disease, unspecified: Secondary | ICD-10-CM | POA: Diagnosis not present

## 2017-12-28 DIAGNOSIS — E1121 Type 2 diabetes mellitus with diabetic nephropathy: Secondary | ICD-10-CM | POA: Diagnosis not present

## 2017-12-28 DIAGNOSIS — M545 Low back pain: Secondary | ICD-10-CM | POA: Diagnosis not present

## 2018-01-04 NOTE — Progress Notes (Deleted)
[facility] MD/PA/NP OP Progress Note  01/04/2018 2:35 PM [name]  MRN:  [id number]  Chief Complaint:  HPI: *** Visit Diagnosis: No diagnosis found.  Past Psychiatric History: Please see initial evaluation for full details. I have reviewed the history. No updates at this time.     Past Medical History:  Past Medical History:  Diagnosis Date  . Arthritis   . Asthma   . Chest pain   . Coronary artery disease    a. LAD PCI in 2005 b. cath in 2014 showed patent pLAD stent c. Cath 06/11/17 severe ISR of overlapping Cypher DES in pLAD s/p cutting ballon PTCA only; mild dLAD dz  . Diabetes mellitus   . Diabetic neuropathy ([place])   . GERD (gastroesophageal reflux disease)   . History of skin cancer   . Hypercholesteremia   . Hypertension   . Myocardial infarction ([place])   . Panic attack   . Sleep apnea   . Thyroid disease     Past Surgical History:  Procedure Laterality Date  . ABDOMINAL HYSTERECTOMY    . BACK SURGERY    . CHOLECYSTECTOMY    . CORONARY ANGIOPLASTY WITH STENT PLACEMENT    . CORONARY BALLOON ANGIOPLASTY N/A 06/11/2017   Procedure: CORONARY BALLOON ANGIOPLASTY;  Surgeon: Leonie Man, MD;  Location: [place] [facility];  Service: Cardiovascular;  Laterality: N/A;  LAD  . CORONARY STENT INTERVENTION  09/07/2017   STENT SYNERGY DES 3X24  . CORONARY STENT INTERVENTION N/A 09/07/2017   Procedure: CORONARY STENT INTERVENTION;  Surgeon: Jettie Booze, MD;  Location: [place] [facility];  Service: Cardiovascular;  Laterality: N/A;  . ELBOW SURGERY    . KNEE SURGERY    . LEFT HEART CATH AND CORONARY ANGIOGRAPHY N/A 06/11/2017   Procedure: LEFT HEART CATH AND CORONARY ANGIOGRAPHY;  Surgeon: Leonie Man, MD;  Location: [place] [facility];  Service: Cardiovascular;  Laterality: N/A;  . LEFT HEART CATH AND CORONARY ANGIOGRAPHY N/A 09/07/2017   Procedure: LEFT HEART CATH AND CORONARY ANGIOGRAPHY;  Surgeon: Jettie Booze, MD;  Location: [place] [facility];   Service: Cardiovascular;  Laterality: N/A;  . LEFT HEART CATH AND CORONARY ANGIOGRAPHY N/A 11/26/2017   Procedure: LEFT HEART CATH AND CORONARY ANGIOGRAPHY;  Surgeon: Belva Crome, MD;  Location: [place] [facility];  Service: Cardiovascular;  Laterality: N/A;  . LEFT HEART CATHETERIZATION WITH CORONARY ANGIOGRAM N/A 06/23/2012   Procedure: LEFT HEART CATHETERIZATION WITH CORONARY ANGIOGRAM;  Surgeon: Sinclair Grooms, MD;  Location: [facility];  Service: Cardiovascular;  Laterality: N/A;    Family Psychiatric History: Please see initial evaluation for full details. I have reviewed the history. No updates at this time.     Family History:  Family History  Problem Relation Age of Onset  . COPD Mother   . Heart disease Father   . Heart attack Father     Social History:  Social History   Socioeconomic History  . Marital status: Divorced    Spouse name: Not on file  . Number of children: Not on file  . Years of education: Not on file  . Highest education level: Not on file  Occupational History  . Not on file  Social Needs  . Financial resource strain: Not on file  . Food insecurity:    Worry: Not on file    Inability: Not on file  . Transportation needs:    Medical: Not on file    Non-medical: Not  on file  Tobacco Use  . Smoking status: Never Smoker  . Smokeless tobacco: Never Used  Substance and Sexual Activity  . Alcohol use: No    Alcohol/week: 0.0 standard drinks  . Drug use: No  . Sexual activity: Not on file  Lifestyle  . Physical activity:    Days per week: Not on file    Minutes per session: Not on file  . Stress: Not on file  Relationships  . Social connections:    Talks on phone: Not on file    Gets together: Not on file    Attends religious service: Not on file    Active member of club or organization: Not on file    Attends meetings of clubs or organizations: Not on file    Relationship status: Not on file  Other Topics Concern  . Not on file   Social History Narrative  . Not on file    Allergies:  Allergies  Allergen Reactions  . Tape Other (See Comments)    Adhesive breaks me out (rash)  . Morphine Itching    Metabolic Disorder Labs: No results found for: HGBA1C, MPG No results found for: PROLACTIN No results found for: CHOL, TRIG, HDL, CHOLHDL, VLDL, LDLCALC Lab Results  Component Value Date   TSH 12.392 (H) 12/02/2017   TSH 0.057 (L) 06/10/2017    Therapeutic Level Labs: No results found for: LITHIUM No results found for: VALPROATE No components found for:  CBMZ  Current Medications: Current Outpatient Medications  Medication Sig Dispense Refill  . amLODipine (NORVASC) 2.5 MG tablet Take 1 tablet (2.5 mg total) by mouth daily. 30 tablet 0  . aspirin EC 81 MG tablet Take 81 mg by mouth daily.    . budesonide-formoterol (SYMBICORT) 160-4.5 MCG/ACT inhaler Inhale 2 puffs into the lungs 2 (two) times daily.    . cyclobenzaprine (FLEXERIL) 10 MG tablet Take 10 mg by mouth 3 (three) times daily as needed for muscle spasms.    . diazepam (VALIUM) 5 MG tablet Take 5 mg by mouth 2 (two) times daily.     . diclofenac sodium (VOLTAREN) 1 % GEL Apply 2-4 g topically 4 (four) times daily as needed (for pain.).   0  . DULoxetine (CYMBALTA) 20 MG capsule Take 2 capsules (40 mg total) by mouth daily. 60 capsule 0  . fluconazole (DIFLUCAN) 150 MG tablet Place 150 mg into the nose See admin instructions. Patient reports 2 sprays in each nostril  5  . fluticasone (FLONASE) 50 MCG/ACT nasal spray Place 1 spray into both nostrils 2 (two) times daily.    . furosemide (LASIX) 40 MG tablet Take 40 mg by mouth daily.     . hydrOXYzine (ATARAX/VISTARIL) 25 MG tablet Take 25 mg by mouth 2 (two) times daily as needed for itching.   5  . insulin degludec (TRESIBA FLEXTOUCH) 100 UNIT/ML SOPN FlexTouch Pen Inject 50 Units into the skin daily.    . insulin lispro (HUMALOG) 100 UNIT/ML injection Inject 10-20 Units into the skin 3 (three)  times daily before meals. Sliding Scale Insulin    . isosorbide mononitrate (IMDUR) 60 MG 24 hr tablet Take 60 mg by mouth as needed.  3  . levothyroxine (SYNTHROID, LEVOTHROID) 125 MCG tablet Take 125 mcg by mouth daily before breakfast.    . levothyroxine (SYNTHROID, LEVOTHROID) 137 MCG tablet Take 137 mcg by mouth daily before breakfast.    . losartan (COZAAR) 25 MG tablet TAKE 1 TABLET BY MOUTH EVERY DAY  90 tablet 3  . Magnesium Oxide 400 (240 Mg) MG TABS Take 400 mg by mouth 2 (two) times daily.  5  . metoprolol succinate (TOPROL-XL) 25 MG 24 hr tablet Take 1 tablet (25 mg total) by mouth daily. 90 tablet 3  . mirtazapine (REMERON) 30 MG tablet Take 1 tablet (30 mg total) by mouth at bedtime. 30 tablet 0  . nitroGLYCERIN (NITROSTAT) 0.4 MG SL tablet PLACE 1 TABLET (0.4 MG TOTAL) UNDER THE TONGUE EVERY 5 (FIVE) MINUTES AS NEEDED FOR CHEST PAIN. 25 tablet 3  . ondansetron (ZOFRAN) 4 MG tablet Take 4 mg by mouth 3 (three) times daily as needed for nausea or vomiting.   3  . Oxycodone HCl 10 MG TABS Take 10 mg by mouth 3 (three) times daily.    . pantoprazole (PROTONIX) 40 MG tablet Take 1 tablet (40 mg total) by mouth daily. 90 tablet 3  . potassium chloride SA (K-DUR,KLOR-CON) 20 MEQ tablet Take 20 mEq by mouth daily.     . prasugrel (EFFIENT) 10 MG TABS tablet Take 1 tablet (10 mg total) by mouth daily. 90 tablet 3  . rosuvastatin (CRESTOR) 40 MG tablet Take 40 mg by mouth every morning.     . tiotropium (SPIRIVA) 18 MCG inhalation capsule Place 18 mcg into inhaler and inhale daily.     . Vitamin D, Ergocalciferol, (DRISDOL) 50000 units CAPS capsule Take 50,000 Units by mouth every Monday.     No current facility-administered medications for this visit.      Musculoskeletal: Strength & Muscle Tone: within normal limits Gait & Station: normal Patient leans: N/A  Psychiatric Specialty Exam: ROS  There were no vitals taken for this visit.There is no height or weight on file to calculate  BMI.  General Appearance: Fairly Groomed  Eye Contact:  Good  Speech:  Clear and Coherent  Volume:  Normal  Mood:  {BHH MOOD:22306}  Affect:  {Affect (PAA):22687}  Thought Process:  Coherent  Orientation:  Full (Time, Place, and Person)  Thought Content: Logical   Suicidal Thoughts:  {ST/HT (PAA):22692}  Homicidal Thoughts:  {ST/HT (PAA):22692}  Memory:  Immediate;   Good  Judgement:  {Judgement (PAA):22694}  Insight:  {Insight (PAA):22695}  Psychomotor Activity:  Normal  Concentration:  Concentration: Good and Attention Span: Good  Recall:  Good  Fund of Knowledge: Good  Language: Good  Akathisia:  No  Handed:  Right  AIMS (if indicated): not done  Assets:  Communication Skills Desire for Improvement  ADL's:  Intact  Cognition: WNL  Sleep:  {BHH GOOD/FAIR/POOR:22877}   Screenings:   Assessment and Plan:  [name] is a 62 y.o. year old female with a history of depression, CAD, hypertension, hyperlipidemia, diabetes, asthma, severe OSA, hypothyroidism by history , who presents for follow up appointment for No diagnosis found.  # MDD, moderate, recurrent without psychotic features # r/o PTSD    Patient endorses neurovegetative symptoms, which has worsened over the past few months after witnessing the death of her significant other.  Other psychosocial stressors including medical condition, and loneliness.  Will switch from Paxil to duloxetine to target depression.  Discussed potential risk of serotonin syndrome, and hypertension.  Will continue mirtazapine at this time as adjunctive treatment for depression.  Will consider tapering it off in the future if no significant benefit from this medication.  Although she may benefit from prazosin in the future for nightmares, will hold this at this time given she is on antihypertensive medication.  Discussed behavioral activation.  She will greatly benefit from CBT; will make a referral.   Plan 1. Decrease Paxil 10 mg daily  for one week, then discontinue  2. Start duloxetine 20 mg daily for one week, then 40 mg daily  3. Continue mirtazapine 30 mg at night  4. Return to clinic in one month for 30 mins 5. Referral to therapy  - she is on valium prescribed by her PCP - she receives treatment for hypothyroidism from endocrinologist in [place]  The patient demonstrates the following risk factors for suicide: Chronic risk factors for suicide include: psychiatric disorder of depression and chronic pain. Acute risk factors for suicide include: unemployment and loss (financial, interpersonal, professional). Protective factors for this patient include: hope for the future. Considering these factors, the overall suicide risk at this point appears to be low. Patient is appropriate for outpatient follow up.   Norman Clay, MD 01/04/2018, 2:35 PM

## 2018-01-06 ENCOUNTER — Ambulatory Visit: Payer: Self-pay | Admitting: Cardiology

## 2018-01-06 ENCOUNTER — Ambulatory Visit (HOSPITAL_COMMUNITY): Payer: Medicare Other | Admitting: Psychiatry

## 2018-01-11 DIAGNOSIS — E1165 Type 2 diabetes mellitus with hyperglycemia: Secondary | ICD-10-CM | POA: Diagnosis not present

## 2018-01-11 DIAGNOSIS — I10 Essential (primary) hypertension: Secondary | ICD-10-CM | POA: Diagnosis not present

## 2018-01-11 DIAGNOSIS — G4733 Obstructive sleep apnea (adult) (pediatric): Secondary | ICD-10-CM | POA: Diagnosis not present

## 2018-01-11 DIAGNOSIS — E7800 Pure hypercholesterolemia, unspecified: Secondary | ICD-10-CM | POA: Diagnosis not present

## 2018-01-11 DIAGNOSIS — E039 Hypothyroidism, unspecified: Secondary | ICD-10-CM | POA: Diagnosis not present

## 2018-01-13 ENCOUNTER — Ambulatory Visit (HOSPITAL_COMMUNITY): Payer: Medicare Other | Admitting: Licensed Clinical Social Worker

## 2018-01-19 ENCOUNTER — Other Ambulatory Visit: Payer: Self-pay | Admitting: Cardiology

## 2018-01-19 DIAGNOSIS — I10 Essential (primary) hypertension: Secondary | ICD-10-CM

## 2018-01-27 DIAGNOSIS — J449 Chronic obstructive pulmonary disease, unspecified: Secondary | ICD-10-CM | POA: Diagnosis not present

## 2018-02-03 ENCOUNTER — Ambulatory Visit (INDEPENDENT_AMBULATORY_CARE_PROVIDER_SITE_OTHER): Payer: Medicare Other

## 2018-02-03 ENCOUNTER — Telehealth: Payer: Self-pay | Admitting: *Deleted

## 2018-02-03 DIAGNOSIS — I739 Peripheral vascular disease, unspecified: Secondary | ICD-10-CM

## 2018-02-03 DIAGNOSIS — I10 Essential (primary) hypertension: Secondary | ICD-10-CM | POA: Diagnosis not present

## 2018-02-03 NOTE — Telephone Encounter (Signed)
-----   Message from Isaiah Serge, NP sent at 02/03/2018 10:35 AM EST ----- Please let pt know she has good blood flow to legs, and her kidneys are normal.  All good new.

## 2018-02-03 NOTE — Telephone Encounter (Signed)
1st attempt to reach pt re: Renal Artery Duplex results, left a message for her to call back.

## 2018-02-03 NOTE — Telephone Encounter (Signed)
Patient returned call for results 

## 2018-02-03 NOTE — Telephone Encounter (Signed)
rturned prs call, left another message for pt to call back.

## 2018-02-04 NOTE — Telephone Encounter (Signed)
Follow up:   Patient returning call back concerning some results.

## 2018-02-04 NOTE — Telephone Encounter (Signed)
Called pt back and she has been made aware of her Renal US. She thanked me for the call back.

## 2018-02-17 ENCOUNTER — Ambulatory Visit: Payer: Self-pay | Admitting: Cardiology

## 2018-02-23 ENCOUNTER — Ambulatory Visit (HOSPITAL_COMMUNITY): Payer: Medicare Other | Admitting: Psychiatry

## 2018-02-27 DIAGNOSIS — J449 Chronic obstructive pulmonary disease, unspecified: Secondary | ICD-10-CM | POA: Diagnosis not present

## 2018-03-02 ENCOUNTER — Other Ambulatory Visit (HOSPITAL_COMMUNITY): Payer: Self-pay | Admitting: Psychiatry

## 2018-03-02 MED ORDER — MIRTAZAPINE 30 MG PO TABS: 30.0000 mg | ORAL_TABLET | Freq: Every day | ORAL | 0 refills | Status: AC

## 2018-03-14 NOTE — Progress Notes (Deleted)
[place] MD/PA/NP OP Progress Note  03/14/2018 10:40 AM [name]  MRN:  [id number]  Chief Complaint:  HPI: *** Visit Diagnosis: No diagnosis found.  Past Psychiatric History: Please see initial evaluation for full details. I have reviewed the history. No updates at this time.     Past Medical History:  Past Medical History:  Diagnosis Date  . Arthritis   . Asthma   . Chest pain   . Coronary artery disease    a. LAD PCI in 2005 b. cath in 2014 showed patent pLAD stent c. Cath 06/11/17 severe ISR of overlapping Cypher DES in pLAD s/p cutting ballon PTCA only; mild dLAD dz  . Diabetes mellitus   . Diabetic neuropathy ([place])   . GERD (gastroesophageal reflux disease)   . History of skin cancer   . Hypercholesteremia   . Hypertension   . Myocardial infarction ([place])   . Panic attack   . Sleep apnea   . Thyroid disease     Past Surgical History:  Procedure Laterality Date  . ABDOMINAL HYSTERECTOMY    . BACK SURGERY    . CHOLECYSTECTOMY    . CORONARY ANGIOPLASTY WITH STENT PLACEMENT    . CORONARY BALLOON ANGIOPLASTY N/A 06/11/2017   Procedure: CORONARY BALLOON ANGIOPLASTY;  Surgeon: Leonie Man, MD;  Location: [place] [facility];  Service: Cardiovascular;  Laterality: N/A;  LAD  . CORONARY STENT INTERVENTION  09/07/2017   STENT SYNERGY DES 3X24  . CORONARY STENT INTERVENTION N/A 09/07/2017   Procedure: CORONARY STENT INTERVENTION;  Surgeon: Jettie Booze, MD;  Location: [place] [facility];  Service: Cardiovascular;  Laterality: N/A;  . ELBOW SURGERY    . KNEE SURGERY    . LEFT HEART CATH AND CORONARY ANGIOGRAPHY N/A 06/11/2017   Procedure: LEFT HEART CATH AND CORONARY ANGIOGRAPHY;  Surgeon: Leonie Man, MD;  Location: [place] [facility];  Service: Cardiovascular;  Laterality: N/A;  . LEFT HEART CATH AND CORONARY ANGIOGRAPHY N/A 09/07/2017   Procedure: LEFT HEART CATH AND CORONARY ANGIOGRAPHY;  Surgeon: Jettie Booze, MD;  Location: [place] [facility];   Service: Cardiovascular;  Laterality: N/A;  . LEFT HEART CATH AND CORONARY ANGIOGRAPHY N/A 11/26/2017   Procedure: LEFT HEART CATH AND CORONARY ANGIOGRAPHY;  Surgeon: Belva Crome, MD;  Location: [place] [facility];  Service: Cardiovascular;  Laterality: N/A;  . LEFT HEART CATHETERIZATION WITH CORONARY ANGIOGRAM N/A 06/23/2012   Procedure: LEFT HEART CATHETERIZATION WITH CORONARY ANGIOGRAM;  Surgeon: Sinclair Grooms, MD;  Location: [facility];  Service: Cardiovascular;  Laterality: N/A;    Family Psychiatric History: Please see initial evaluation for full details. I have reviewed the history. No updates at this time.     Family History:  Family History  Problem Relation Age of Onset  . COPD Mother   . Heart disease Father   . Heart attack Father     Social History:  Social History   Socioeconomic History  . Marital status: Divorced    Spouse name: Not on file  . Number of children: Not on file  . Years of education: Not on file  . Highest education level: Not on file  Occupational History  . Not on file  Social Needs  . Financial resource strain: Not on file  . Food insecurity:    Worry: Not on file    Inability: Not on file  . Transportation needs:    Medical: Not on file    Non-medical: Not  on file  Tobacco Use  . Smoking status: Never Smoker  . Smokeless tobacco: Never Used  Substance and Sexual Activity  . Alcohol use: No    Alcohol/week: 0.0 standard drinks  . Drug use: No  . Sexual activity: Not on file  Lifestyle  . Physical activity:    Days per week: Not on file    Minutes per session: Not on file  . Stress: Not on file  Relationships  . Social connections:    Talks on phone: Not on file    Gets together: Not on file    Attends religious service: Not on file    Active member of club or organization: Not on file    Attends meetings of clubs or organizations: Not on file    Relationship status: Not on file  Other Topics Concern  . Not on file   Social History Narrative  . Not on file    Allergies:  Allergies  Allergen Reactions  . Tape Other (See Comments)    Adhesive breaks me out (rash)  . Morphine Itching    Metabolic Disorder Labs: No results found for: HGBA1C, MPG No results found for: PROLACTIN No results found for: CHOL, TRIG, HDL, CHOLHDL, VLDL, LDLCALC Lab Results  Component Value Date   TSH 12.392 (H) 12/02/2017   TSH 0.057 (L) 06/10/2017    Therapeutic Level Labs: No results found for: LITHIUM No results found for: VALPROATE No components found for:  CBMZ  Current Medications: Current Outpatient Medications  Medication Sig Dispense Refill  . amLODipine (NORVASC) 2.5 MG tablet Take 1 tablet (2.5 mg total) by mouth daily. 30 tablet 0  . aspirin EC 81 MG tablet Take 81 mg by mouth daily.    . budesonide-formoterol (SYMBICORT) 160-4.5 MCG/ACT inhaler Inhale 2 puffs into the lungs 2 (two) times daily.    . cyclobenzaprine (FLEXERIL) 10 MG tablet Take 10 mg by mouth 3 (three) times daily as needed for muscle spasms.    . diazepam (VALIUM) 5 MG tablet Take 5 mg by mouth 2 (two) times daily.     . diclofenac sodium (VOLTAREN) 1 % GEL Apply 2-4 g topically 4 (four) times daily as needed (for pain.).   0  . DULoxetine (CYMBALTA) 20 MG capsule Take 2 capsules (40 mg total) by mouth daily. 60 capsule 0  . fluconazole (DIFLUCAN) 150 MG tablet Place 150 mg into the nose See admin instructions. Patient reports 2 sprays in each nostril  5  . fluticasone (FLONASE) 50 MCG/ACT nasal spray Place 1 spray into both nostrils 2 (two) times daily.    . furosemide (LASIX) 40 MG tablet Take 40 mg by mouth daily.     . hydrOXYzine (ATARAX/VISTARIL) 25 MG tablet Take 25 mg by mouth 2 (two) times daily as needed for itching.   5  . insulin degludec (TRESIBA FLEXTOUCH) 100 UNIT/ML SOPN FlexTouch Pen Inject 50 Units into the skin daily.    . insulin lispro (HUMALOG) 100 UNIT/ML injection Inject 10-20 Units into the skin 3 (three)  times daily before meals. Sliding Scale Insulin    . isosorbide mononitrate (IMDUR) 60 MG 24 hr tablet Take 60 mg by mouth as needed.  3  . levothyroxine (SYNTHROID, LEVOTHROID) 125 MCG tablet Take 125 mcg by mouth daily before breakfast.    . levothyroxine (SYNTHROID, LEVOTHROID) 137 MCG tablet Take 137 mcg by mouth daily before breakfast.    . losartan (COZAAR) 25 MG tablet TAKE 1 TABLET BY MOUTH EVERY DAY  90 tablet 3  . Magnesium Oxide 400 (240 Mg) MG TABS Take 400 mg by mouth 2 (two) times daily.  5  . metoprolol succinate (TOPROL-XL) 25 MG 24 hr tablet Take 1 tablet (25 mg total) by mouth daily. 90 tablet 3  . mirtazapine (REMERON) 30 MG tablet Take 1 tablet (30 mg total) by mouth at bedtime. 30 tablet 0  . nitroGLYCERIN (NITROSTAT) 0.4 MG SL tablet PLACE 1 TABLET (0.4 MG TOTAL) UNDER THE TONGUE EVERY 5 (FIVE) MINUTES AS NEEDED FOR CHEST PAIN. 25 tablet 3  . ondansetron (ZOFRAN) 4 MG tablet Take 4 mg by mouth 3 (three) times daily as needed for nausea or vomiting.   3  . Oxycodone HCl 10 MG TABS Take 10 mg by mouth 3 (three) times daily.    . pantoprazole (PROTONIX) 40 MG tablet Take 1 tablet (40 mg total) by mouth daily. 90 tablet 3  . potassium chloride SA (K-DUR,KLOR-CON) 20 MEQ tablet Take 20 mEq by mouth daily.     . prasugrel (EFFIENT) 10 MG TABS tablet Take 1 tablet (10 mg total) by mouth daily. 90 tablet 3  . rosuvastatin (CRESTOR) 40 MG tablet Take 40 mg by mouth every morning.     . tiotropium (SPIRIVA) 18 MCG inhalation capsule Place 18 mcg into inhaler and inhale daily.     . Vitamin D, Ergocalciferol, (DRISDOL) 50000 units CAPS capsule Take 50,000 Units by mouth every Monday.     No current facility-administered medications for this visit.      Musculoskeletal: Strength & Muscle Tone: within normal limits Gait & Station: normal Patient leans: N/A  Psychiatric Specialty Exam: ROS  There were no vitals taken for this visit.There is no height or weight on file to calculate  BMI.  General Appearance: Fairly Groomed  Eye Contact:  Good  Speech:  Clear and Coherent  Volume:  Normal  Mood:  {BHH MOOD:22306}  Affect:  {Affect (PAA):22687}  Thought Process:  Coherent  Orientation:  Full (Time, Place, and Person)  Thought Content: Logical   Suicidal Thoughts:  {ST/HT (PAA):22692}  Homicidal Thoughts:  {ST/HT (PAA):22692}  Memory:  Immediate;   Good  Judgement:  {Judgement (PAA):22694}  Insight:  {Insight (PAA):22695}  Psychomotor Activity:  Normal  Concentration:  Concentration: Good and Attention Span: Good  Recall:  Good  Fund of Knowledge: Good  Language: Good  Akathisia:  No  Handed:  Right  AIMS (if indicated): not done  Assets:  Communication Skills Desire for Improvement  ADL's:  Intact  Cognition: WNL  Sleep:  {BHH GOOD/FAIR/POOR:22877}   Screenings:   Assessment and Plan:  [name] is a 63 y.o. year old female with a history of depression,  CAD, hypertension, hyperlipidemia, diabetes, asthma, severe OSA, hypothyroidism by history , who presents for follow up appointment for No diagnosis found.  # MDD, moderate, recurrent without psychotic features # r/o PTSD Patient endorses neurovegetative symptoms, which has worsened over the past few months after witnessing the death of her significant other.  Other psychosocial stressors including medical condition, and loneliness.  Will switch from Paxil to duloxetine to target depression.  Discussed potential risk of serotonin syndrome, and hypertension.  Will continue mirtazapine at this time as adjunctive treatment for depression.  Will consider tapering it off in the future if no significant benefit from this medication.  Although she may benefit from prazosin in the future for nightmares, will hold this at this time given she is on antihypertensive medication.  Discussed behavioral  activation.  She will greatly benefit from CBT; will make a referral.   Plan 1. Decrease Paxil 10 mg daily for  one week, then discontinue  2. Start duloxetine 20 mg daily for one week, then 40 mg daily  3. Continue mirtazapine 30 mg at night  4. Return to clinic in one month for 30 mins 5. Referral to therapy  - she is on valium prescribed by her PCP - she receives treatment for hypothyroidism from endocrinologist in [place]  The patient demonstrates the following risk factors for suicide: Chronic risk factors for suicide include: psychiatric disorder of depression and chronic pain. Acute risk factors for suicide include: unemployment and loss (financial, interpersonal, professional). Protective factors for this patient include: hope for the future. Considering these factors, the overall suicide risk at this point appears to be low. Patient is appropriate for outpatient follow up.  Norman Clay, MD 03/14/2018, 10:40 AM

## 2018-03-17 ENCOUNTER — Ambulatory Visit (HOSPITAL_COMMUNITY): Payer: Medicare Other | Admitting: Psychiatry

## 2018-03-22 ENCOUNTER — Ambulatory Visit: Payer: Medicare Other | Admitting: Cardiology

## 2018-03-30 DIAGNOSIS — J449 Chronic obstructive pulmonary disease, unspecified: Secondary | ICD-10-CM | POA: Diagnosis not present

## 2018-03-31 DIAGNOSIS — Z0000 Encounter for general adult medical examination without abnormal findings: Secondary | ICD-10-CM | POA: Diagnosis not present

## 2018-04-19 DIAGNOSIS — G4733 Obstructive sleep apnea (adult) (pediatric): Secondary | ICD-10-CM | POA: Diagnosis not present

## 2018-04-20 ENCOUNTER — Telehealth: Payer: Self-pay

## 2018-04-20 NOTE — Telephone Encounter (Addendum)
Left message for patient to call back to discuss appointment with Cecilie Kicks, NP on 04/26/18 to see how pt is doing , due to COVID19 precautions.

## 2018-04-22 NOTE — Telephone Encounter (Signed)
Left message to call back  

## 2018-04-25 ENCOUNTER — Telehealth: Payer: Self-pay | Admitting: *Deleted

## 2018-04-25 NOTE — Progress Notes (Deleted)
Cardiology Office Note   Date:  04/25/2018   ID:  [name], [name] 1955-10-02, MRN [id number]  PCP:  Sinda Du, MD  Cardiologist:  Dr. Tamala Julian    No chief complaint on file.     History of Present Illness: [name] is a 63 y.o. female who presents for ***  She has had stents placed to Ost to Prox LAD for 90% stenosis. DES this was at site of prior PTCA.   Non Responder to Plavix. Was on Brilinta but did not tolerate now on effient.    She has a hx of asthma, DM-2, GERD, HLD, panic attacks, sleep apnea, thyroid disease and  Hx of CAD with PCI to LAD in 2005 And in 06/2017 she had admit wityh chest pain and near syncope and under went cardiac cath and had severe ISR of overlapping cyper DES in p-mLAD and underwent scoring balloon angioplasty and PTCA. She also had RBBB on EKG that was new. Also with orthostatic hypotension. she had cardiac cath and had restenosis of LAD and underwentstent placed to Ost to Prox LAD for 90% stenosis. DES this was at site of prior PTCA.    8/22/19she still has some chest pain, one spot on Lt ant chest and is tender to palpation. She is having eye blurring and SOB.   Also headache in back of her head. She is worried the site may be closing up. We discussed now with stent less likely this will happen. Her symptoms are similar to when she left the hospital. Also BP at home is very high at times.We stopped the imdur , CXR was normal and labs were stable including of 24. Discussed with Dr. Tamala Julian and we chanaged Brilinta to Effient after load with effient.  On last visit she tells me she is having the same symptoms again. Black spots with she walks and chest pain. It does waken her from sleep at night., She takes NTG with relief. She has been taking the effient. She is dyspneic with the pain as well. On last visit her glucose was 500 - she has seen Dr. Bubba Camp back and her meds have been adjusted. Her glucose  is labile at times. No syncope.    Cardiac cath was arranged and her LAD stent was patent.  Phase II cardiac rehab was recommended.  Continue medications.    She was admitted 12/01/17 for lt sided chest pain.  Pain resolves with rest.   Imdur was not stopped and amlodipine was started.  Dr. Harl Bowie saw in consult and did not believe cardiac pain.  CTA of chest was neg for PE and no acute aortic syndrome  TSH is 12.39 to see Dr. Bubba Camp  Orthostatic on last visit.  No PE on CTA, - she has syncope and was to wear a monitor has not had placed.   Has not done cardiac rehab.    lower ext and renal dopplers nomral.   Past Medical History:  Diagnosis Date  . Arthritis   . Asthma   . Chest pain   . Coronary artery disease    a. LAD PCI in 2005 b. cath in 2014 showed patent pLAD stent c. Cath 06/11/17 severe ISR of overlapping Cypher DES in pLAD s/p cutting ballon PTCA only; mild dLAD dz  . Diabetes mellitus   . Diabetic neuropathy ([place])   . GERD (gastroesophageal reflux disease)   . History of skin cancer   . Hypercholesteremia   . Hypertension   .  Myocardial infarction ([place])   . Panic attack   . Sleep apnea   . Thyroid disease     Past Surgical History:  Procedure Laterality Date  . ABDOMINAL HYSTERECTOMY    . BACK SURGERY    . CHOLECYSTECTOMY    . CORONARY ANGIOPLASTY WITH STENT PLACEMENT    . CORONARY BALLOON ANGIOPLASTY N/A 06/11/2017   Procedure: CORONARY BALLOON ANGIOPLASTY;  Surgeon: Leonie Man, MD;  Location: [facility];  Service: Cardiovascular;  Laterality: N/A;  LAD  . CORONARY STENT INTERVENTION  09/07/2017   STENT SYNERGY DES 3X24  . CORONARY STENT INTERVENTION N/A 09/07/2017   Procedure: CORONARY STENT INTERVENTION;  Surgeon: Jettie Booze, MD;  Location: [facility] [facility];  Service: Cardiovascular;  Laterality: N/A;  . ELBOW SURGERY    . KNEE SURGERY    . LEFT HEART CATH AND CORONARY ANGIOGRAPHY N/A 06/11/2017   Procedure: LEFT HEART CATH AND  CORONARY ANGIOGRAPHY;  Surgeon: Leonie Man, MD;  Location: [facility];  Service: Cardiovascular;  Laterality: N/A;  . LEFT HEART CATH AND CORONARY ANGIOGRAPHY N/A 09/07/2017   Procedure: LEFT HEART CATH AND CORONARY ANGIOGRAPHY;  Surgeon: Jettie Booze, MD;  Location: [facility] [place] [facility] [facility];  Service: Cardiovascular;  Laterality: N/A;  . LEFT HEART CATH AND CORONARY ANGIOGRAPHY N/A 11/26/2017   Procedure: LEFT HEART CATH AND CORONARY ANGIOGRAPHY;  Surgeon: Belva Crome, MD;  Location: [facility];  Service: Cardiovascular;  Laterality: N/A;  . LEFT HEART CATHETERIZATION WITH CORONARY ANGIOGRAM N/A 06/23/2012   Procedure: LEFT HEART CATHETERIZATION WITH CORONARY ANGIOGRAM;  Surgeon: Sinclair Grooms, MD;  Location[facility];  Service: Cardiovascular;  Laterality: N/A;     Current Outpatient Medications  Medication Sig Dispense Refill  . amLODipine (NORVASC) 2.5 MG tablet Take 1 tablet (2.5 mg total) by mouth daily. 30 tablet 0  . aspirin EC 81 MG tablet Take 81 mg by mouth daily.    . budesonide-formoterol (SYMBICORT) 160-4.5 MCG/ACT inhaler Inhale 2 puffs into the lungs 2 (two) times daily.    . cyclobenzaprine (FLEXERIL) 10 MG tablet Take 10 mg by mouth 3 (three) times daily as needed for muscle spasms.    . diazepam (VALIUM) 5 MG tablet Take 5 mg by mouth 2 (two) times daily.     . diclofenac sodium (VOLTAREN) 1 % GEL Apply 2-4 g topically 4 (four) times daily as needed (for pain.).   0  . DULoxetine (CYMBALTA) 20 MG capsule Take 2 capsules (40 mg total) by mouth daily. 60 capsule 0  . fluconazole (DIFLUCAN) 150 MG tablet Place 150 mg into the nose See admin instructions. Patient reports 2 sprays in each nostril  5  . fluticasone (FLONASE) 50 MCG/ACT nasal spray Place 1 spray into both nostrils 2 (two) times daily.    . furosemide (LASIX) 40 MG tablet Take 40 mg by mouth daily.     . hydrOXYzine (ATARAX/VISTARIL) 25 MG tablet Take 25 mg by mouth 2 (two) times daily  as needed for itching.   5  . insulin degludec (TRESIBA FLEXTOUCH) 100 UNIT/ML SOPN FlexTouch Pen Inject 50 Units into the skin daily.    . insulin lispro (HUMALOG) 100 UNIT/ML injection Inject 10-20 Units into the skin 3 (three) times daily before meals. Sliding Scale Insulin    . isosorbide mononitrate (IMDUR) 60 MG 24 hr tablet Take 60 mg by mouth as needed.  3  . levothyroxine (SYNTHROID, LEVOTHROID) 125 MCG tablet Take  125 mcg by mouth daily before breakfast.    . levothyroxine (SYNTHROID, LEVOTHROID) 137 MCG tablet Take 137 mcg by mouth daily before breakfast.    . losartan (COZAAR) 25 MG tablet TAKE 1 TABLET BY MOUTH EVERY DAY 90 tablet 3  . Magnesium Oxide 400 (240 Mg) MG TABS Take 400 mg by mouth 2 (two) times daily.  5  . metoprolol succinate (TOPROL-XL) 25 MG 24 hr tablet Take 1 tablet (25 mg total) by mouth daily. 90 tablet 3  . mirtazapine (REMERON) 30 MG tablet Take 1 tablet (30 mg total) by mouth at bedtime. 30 tablet 0  . nitroGLYCERIN (NITROSTAT) 0.4 MG SL tablet PLACE 1 TABLET (0.4 MG TOTAL) UNDER THE TONGUE EVERY 5 (FIVE) MINUTES AS NEEDED FOR CHEST PAIN. 25 tablet 3  . ondansetron (ZOFRAN) 4 MG tablet Take 4 mg by mouth 3 (three) times daily as needed for nausea or vomiting.   3  . Oxycodone HCl 10 MG TABS Take 10 mg by mouth 3 (three) times daily.    . pantoprazole (PROTONIX) 40 MG tablet Take 1 tablet (40 mg total) by mouth daily. 90 tablet 3  . potassium chloride SA (K-DUR,KLOR-CON) 20 MEQ tablet Take 20 mEq by mouth daily.     . prasugrel (EFFIENT) 10 MG TABS tablet Take 1 tablet (10 mg total) by mouth daily. 90 tablet 3  . rosuvastatin (CRESTOR) 40 MG tablet Take 40 mg by mouth every morning.     . tiotropium (SPIRIVA) 18 MCG inhalation capsule Place 18 mcg into inhaler and inhale daily.     . Vitamin D, Ergocalciferol, (DRISDOL) 50000 units CAPS capsule Take 50,000 Units by mouth every Monday.     No current facility-administered medications for this visit.      Allergies:   Tape and Morphine    Social History:  The patient  reports that she has never smoked. She has never used smokeless tobacco. She reports that she does not drink alcohol or use drugs.   Family History:  The patient's ***family history includes COPD in her mother; Heart attack in her father; Heart disease in her father.    ROS:  General:no colds or fevers, no weight changes Skin:no rashes or ulcers HEENT:no blurred vision, no congestion CV:see HPI PUL:see HPI GI:no diarrhea constipation or melena, no indigestion GU:no hematuria, no dysuria MS:no joint pain, no claudication Neuro:no syncope, no lightheadedness Endo:no diabetes, no thyroid disease Wt Readings from Last 3 Encounters:  12/10/17 219 lb (99.3 kg)  12/01/17 205 lb (93 kg)  11/26/17 205 lb (93 kg)     PHYSICAL EXAM: VS:  There were no vitals taken for this visit. , BMI There is no height or weight on file to calculate BMI. General:Pleasant affect, NAD Skin:Warm and dry, brisk capillary refill HEENT:normocephalic, sclera clear, mucus membranes moist Neck:supple, no JVD, no bruits  Heart:S1S2 RRR without murmur, gallup, rub or click Lungs:clear without rales, rhonchi, or wheezes XNA:TFTD, non tender, + BS, do not palpate liver spleen or masses Ext:no lower ext edema, 2+ pedal pulses, 2+ radial pulses Neuro:alert and oriented, MAE, follows commands, + facial symmetry    EKG:  EKG is ordered today. The ekg ordered today demonstrates ***   Recent Labs: 06/10/2017: Magnesium 1.5 06/11/2017: ALT 27 09/23/2017: NT-Pro BNP 24 12/01/2017: Hemoglobin 10.4; Platelets 179 12/02/2017: BUN 17; Creatinine, Ser 0.98; Potassium 4.2; Sodium 139; TSH 12.392    Lipid Panel No results found for: CHOL, TRIG, HDL, CHOLHDL, VLDL, LDLCALC, LDLDIRECT  Other studies Reviewed: Additional studies/ records that were reviewed today include: ***.   ASSESSMENT AND PLAN:  1.  ***   Current medicines are reviewed with  the patient today.  The patient Has no concerns regarding medicines.  The following changes have been made:  See above Labs/ tests ordered today include:see above  Disposition:   FU:  see above  Signed, Cecilie Kicks, NP  04/25/2018 5:23 PM    [facility] [place], [place], [place], [place] Phone: [phone]; Fax: [phone]

## 2018-04-25 NOTE — Telephone Encounter (Signed)
2nd attempt to reach pt re: appt, 04/26/2018. Left another message for pt to call back to be prescreened to see if appt can be cancelled.

## 2018-04-26 ENCOUNTER — Ambulatory Visit: Payer: Medicare Other | Admitting: Cardiology

## 2018-04-28 DIAGNOSIS — J449 Chronic obstructive pulmonary disease, unspecified: Secondary | ICD-10-CM | POA: Diagnosis not present

## 2018-05-23 ENCOUNTER — Other Ambulatory Visit: Payer: Self-pay | Admitting: Physician Assistant

## 2018-05-29 DIAGNOSIS — J449 Chronic obstructive pulmonary disease, unspecified: Secondary | ICD-10-CM | POA: Diagnosis not present

## 2018-05-30 ENCOUNTER — Other Ambulatory Visit: Payer: Self-pay | Admitting: Interventional Cardiology

## 2018-05-30 MED ORDER — LOSARTAN POTASSIUM 25 MG PO TABS: 25.0000 mg | ORAL_TABLET | Freq: Every day | ORAL | 2 refills | Status: DC

## 2018-06-28 DIAGNOSIS — J449 Chronic obstructive pulmonary disease, unspecified: Secondary | ICD-10-CM | POA: Diagnosis not present

## 2018-07-04 ENCOUNTER — Other Ambulatory Visit: Payer: Self-pay

## 2018-07-04 ENCOUNTER — Encounter: Payer: Medicare Other | Admitting: Cardiology

## 2018-07-04 DIAGNOSIS — I129 Hypertensive chronic kidney disease with stage 1 through stage 4 chronic kidney disease, or unspecified chronic kidney disease: Secondary | ICD-10-CM | POA: Diagnosis not present

## 2018-07-04 DIAGNOSIS — E1121 Type 2 diabetes mellitus with diabetic nephropathy: Secondary | ICD-10-CM | POA: Diagnosis not present

## 2018-07-04 DIAGNOSIS — M545 Low back pain: Secondary | ICD-10-CM | POA: Diagnosis not present

## 2018-07-04 DIAGNOSIS — I2510 Atherosclerotic heart disease of native coronary artery without angina pectoris: Secondary | ICD-10-CM | POA: Diagnosis not present

## 2018-07-04 NOTE — Progress Notes (Signed)
This encounter was created in error - please disregard.  This encounter was created in error - please disregard.

## 2018-07-11 DIAGNOSIS — M545 Low back pain: Secondary | ICD-10-CM | POA: Diagnosis not present

## 2018-07-11 DIAGNOSIS — E1121 Type 2 diabetes mellitus with diabetic nephropathy: Secondary | ICD-10-CM | POA: Diagnosis not present

## 2018-07-11 DIAGNOSIS — I2510 Atherosclerotic heart disease of native coronary artery without angina pectoris: Secondary | ICD-10-CM | POA: Diagnosis not present

## 2018-07-11 DIAGNOSIS — I129 Hypertensive chronic kidney disease with stage 1 through stage 4 chronic kidney disease, or unspecified chronic kidney disease: Secondary | ICD-10-CM | POA: Diagnosis not present

## 2018-07-26 ENCOUNTER — Telehealth: Payer: Self-pay | Admitting: Interventional Cardiology

## 2018-07-26 ENCOUNTER — Other Ambulatory Visit: Payer: Self-pay

## 2018-07-26 ENCOUNTER — Telehealth: Payer: Self-pay

## 2018-07-26 DIAGNOSIS — Z20822 Contact with and (suspected) exposure to covid-19: Secondary | ICD-10-CM

## 2018-07-26 NOTE — Telephone Encounter (Signed)
Joy with Dr. Luan Pulling , request COVID 19 test for fever.Practice # [phone], Fax [phone]. Scheduled for today.

## 2018-07-26 NOTE — Telephone Encounter (Signed)
New message   Pt c/o of Chest Pain: STAT if CP now or developed within 24 hours  1. Are you having CP right now? No   2. Are you experiencing any other symptoms (ex. SOB, nausea, vomiting, sweating)? nausea  3. How long have you been experiencing CP? Per patient states several weeks  4. Is your CP continuous or coming and going? Coming and going   5. Have you taken Nitroglycerin? No  ?

## 2018-07-26 NOTE — Telephone Encounter (Signed)
Left message for patient to call back  

## 2018-07-26 NOTE — Telephone Encounter (Signed)
Patient returned your call.

## 2018-07-27 NOTE — Telephone Encounter (Signed)
Pt calling back to discuss her CP.   She states she has a temp of 100.3, dry cough, left arm cramping, chest heaviness, lung pain, achy all over with head congestion. She is concerned with COVID. She was screened yesterday but results have not came back yet. She wonders if she needs to go to the hospital.  I advised her I did not think her sx sounded cardiac related, however if she felt she needed to visit the ED for CP, she should go. I did advise her to call her PCP for recommendation about her viral symptoms, perhaps they can order a rapid test on her.  She agreed.   I will forward to Dr Thompson Caul RN for review.

## 2018-07-27 NOTE — Telephone Encounter (Signed)
Patient is returning call.  °

## 2018-07-28 LAB — NOVEL CORONAVIRUS, NAA: SARS-CoV-2, NAA: NOT DETECTED

## 2018-07-29 DIAGNOSIS — J449 Chronic obstructive pulmonary disease, unspecified: Secondary | ICD-10-CM | POA: Diagnosis not present

## 2018-08-09 DIAGNOSIS — K449 Diaphragmatic hernia without obstruction or gangrene: Secondary | ICD-10-CM | POA: Diagnosis not present

## 2018-08-09 DIAGNOSIS — K922 Gastrointestinal hemorrhage, unspecified: Secondary | ICD-10-CM | POA: Diagnosis not present

## 2018-08-09 DIAGNOSIS — E039 Hypothyroidism, unspecified: Secondary | ICD-10-CM | POA: Diagnosis not present

## 2018-08-09 DIAGNOSIS — I4510 Unspecified right bundle-branch block: Secondary | ICD-10-CM | POA: Diagnosis not present

## 2018-08-09 DIAGNOSIS — E1140 Type 2 diabetes mellitus with diabetic neuropathy, unspecified: Secondary | ICD-10-CM | POA: Diagnosis not present

## 2018-08-09 DIAGNOSIS — C182 Malignant neoplasm of ascending colon: Secondary | ICD-10-CM | POA: Diagnosis not present

## 2018-08-09 DIAGNOSIS — D62 Acute posthemorrhagic anemia: Secondary | ICD-10-CM | POA: Diagnosis not present

## 2018-08-09 DIAGNOSIS — Z7982 Long term (current) use of aspirin: Secondary | ICD-10-CM | POA: Diagnosis not present

## 2018-08-09 DIAGNOSIS — Z803 Family history of malignant neoplasm of breast: Secondary | ICD-10-CM | POA: Diagnosis not present

## 2018-08-09 DIAGNOSIS — C189 Malignant neoplasm of colon, unspecified: Secondary | ICD-10-CM | POA: Diagnosis not present

## 2018-08-09 DIAGNOSIS — E119 Type 2 diabetes mellitus without complications: Secondary | ICD-10-CM | POA: Diagnosis not present

## 2018-08-09 DIAGNOSIS — R197 Diarrhea, unspecified: Secondary | ICD-10-CM | POA: Diagnosis not present

## 2018-08-09 DIAGNOSIS — K3189 Other diseases of stomach and duodenum: Secondary | ICD-10-CM | POA: Diagnosis not present

## 2018-08-09 DIAGNOSIS — K921 Melena: Secondary | ICD-10-CM | POA: Diagnosis not present

## 2018-08-09 DIAGNOSIS — E7800 Pure hypercholesterolemia, unspecified: Secondary | ICD-10-CM | POA: Diagnosis not present

## 2018-08-09 DIAGNOSIS — R140 Abdominal distension (gaseous): Secondary | ICD-10-CM | POA: Diagnosis not present

## 2018-08-09 DIAGNOSIS — Z9049 Acquired absence of other specified parts of digestive tract: Secondary | ICD-10-CM | POA: Diagnosis not present

## 2018-08-09 DIAGNOSIS — I252 Old myocardial infarction: Secondary | ICD-10-CM | POA: Diagnosis not present

## 2018-08-09 DIAGNOSIS — I452 Bifascicular block: Secondary | ICD-10-CM | POA: Diagnosis not present

## 2018-08-09 DIAGNOSIS — I25110 Atherosclerotic heart disease of native coronary artery with unstable angina pectoris: Secondary | ICD-10-CM | POA: Diagnosis not present

## 2018-08-09 DIAGNOSIS — D630 Anemia in neoplastic disease: Secondary | ICD-10-CM | POA: Diagnosis not present

## 2018-08-09 DIAGNOSIS — Z7902 Long term (current) use of antithrombotics/antiplatelets: Secondary | ICD-10-CM | POA: Diagnosis not present

## 2018-08-09 DIAGNOSIS — K567 Ileus, unspecified: Secondary | ICD-10-CM | POA: Diagnosis not present

## 2018-08-09 DIAGNOSIS — Z794 Long term (current) use of insulin: Secondary | ICD-10-CM | POA: Diagnosis not present

## 2018-08-09 DIAGNOSIS — Z1159 Encounter for screening for other viral diseases: Secondary | ICD-10-CM | POA: Diagnosis not present

## 2018-08-09 DIAGNOSIS — R0602 Shortness of breath: Secondary | ICD-10-CM | POA: Diagnosis not present

## 2018-08-09 DIAGNOSIS — K2950 Unspecified chronic gastritis without bleeding: Secondary | ICD-10-CM | POA: Diagnosis not present

## 2018-08-09 DIAGNOSIS — R195 Other fecal abnormalities: Secondary | ICD-10-CM | POA: Diagnosis not present

## 2018-08-09 DIAGNOSIS — K639 Disease of intestine, unspecified: Secondary | ICD-10-CM | POA: Diagnosis not present

## 2018-08-09 DIAGNOSIS — N289 Disorder of kidney and ureter, unspecified: Secondary | ICD-10-CM | POA: Diagnosis not present

## 2018-08-09 DIAGNOSIS — Z8371 Family history of colonic polyps: Secondary | ICD-10-CM | POA: Diagnosis not present

## 2018-08-09 DIAGNOSIS — K219 Gastro-esophageal reflux disease without esophagitis: Secondary | ICD-10-CM | POA: Diagnosis not present

## 2018-08-09 DIAGNOSIS — Z01810 Encounter for preprocedural cardiovascular examination: Secondary | ICD-10-CM | POA: Diagnosis not present

## 2018-08-09 DIAGNOSIS — R42 Dizziness and giddiness: Secondary | ICD-10-CM | POA: Diagnosis not present

## 2018-08-09 DIAGNOSIS — Z91041 Radiographic dye allergy status: Secondary | ICD-10-CM | POA: Diagnosis not present

## 2018-08-09 DIAGNOSIS — D490 Neoplasm of unspecified behavior of digestive system: Secondary | ICD-10-CM | POA: Diagnosis not present

## 2018-08-09 DIAGNOSIS — I10 Essential (primary) hypertension: Secondary | ICD-10-CM | POA: Diagnosis not present

## 2018-08-09 DIAGNOSIS — K6389 Other specified diseases of intestine: Secondary | ICD-10-CM | POA: Diagnosis not present

## 2018-08-09 DIAGNOSIS — E1165 Type 2 diabetes mellitus with hyperglycemia: Secondary | ICD-10-CM | POA: Diagnosis not present

## 2018-08-09 DIAGNOSIS — G4733 Obstructive sleep apnea (adult) (pediatric): Secondary | ICD-10-CM | POA: Diagnosis not present

## 2018-08-09 DIAGNOSIS — D649 Anemia, unspecified: Secondary | ICD-10-CM | POA: Diagnosis not present

## 2018-08-09 DIAGNOSIS — J449 Chronic obstructive pulmonary disease, unspecified: Secondary | ICD-10-CM | POA: Diagnosis not present

## 2018-08-09 DIAGNOSIS — I2510 Atherosclerotic heart disease of native coronary artery without angina pectoris: Secondary | ICD-10-CM | POA: Diagnosis not present

## 2018-08-09 DIAGNOSIS — E875 Hyperkalemia: Secondary | ICD-10-CM | POA: Diagnosis not present

## 2018-08-09 DIAGNOSIS — Z955 Presence of coronary angioplasty implant and graft: Secondary | ICD-10-CM | POA: Diagnosis not present

## 2018-08-09 DIAGNOSIS — D500 Iron deficiency anemia secondary to blood loss (chronic): Secondary | ICD-10-CM | POA: Diagnosis not present

## 2018-08-11 DIAGNOSIS — C182 Malignant neoplasm of ascending colon: Secondary | ICD-10-CM | POA: Diagnosis not present

## 2018-08-12 DIAGNOSIS — D649 Anemia, unspecified: Secondary | ICD-10-CM | POA: Diagnosis not present

## 2018-08-12 DIAGNOSIS — R195 Other fecal abnormalities: Secondary | ICD-10-CM | POA: Diagnosis not present

## 2018-08-13 DIAGNOSIS — D649 Anemia, unspecified: Secondary | ICD-10-CM | POA: Diagnosis not present

## 2018-08-13 DIAGNOSIS — R195 Other fecal abnormalities: Secondary | ICD-10-CM | POA: Diagnosis not present

## 2018-08-13 DIAGNOSIS — K6389 Other specified diseases of intestine: Secondary | ICD-10-CM | POA: Diagnosis not present

## 2018-08-15 DIAGNOSIS — Z01810 Encounter for preprocedural cardiovascular examination: Secondary | ICD-10-CM | POA: Diagnosis not present

## 2018-08-17 DIAGNOSIS — C182 Malignant neoplasm of ascending colon: Secondary | ICD-10-CM | POA: Diagnosis not present

## 2018-08-19 DIAGNOSIS — N289 Disorder of kidney and ureter, unspecified: Secondary | ICD-10-CM | POA: Diagnosis not present

## 2018-08-19 DIAGNOSIS — I25110 Atherosclerotic heart disease of native coronary artery with unstable angina pectoris: Secondary | ICD-10-CM | POA: Diagnosis not present

## 2018-08-19 DIAGNOSIS — C182 Malignant neoplasm of ascending colon: Secondary | ICD-10-CM | POA: Diagnosis not present

## 2018-08-19 DIAGNOSIS — G4733 Obstructive sleep apnea (adult) (pediatric): Secondary | ICD-10-CM | POA: Diagnosis not present

## 2018-08-19 DIAGNOSIS — D500 Iron deficiency anemia secondary to blood loss (chronic): Secondary | ICD-10-CM | POA: Diagnosis not present

## 2018-08-22 ENCOUNTER — Other Ambulatory Visit: Payer: Self-pay | Admitting: Cardiology

## 2018-08-26 ENCOUNTER — Other Ambulatory Visit: Payer: Self-pay | Admitting: Interventional Cardiology

## 2018-08-26 DIAGNOSIS — Z1159 Encounter for screening for other viral diseases: Secondary | ICD-10-CM | POA: Diagnosis not present

## 2018-08-28 DIAGNOSIS — J449 Chronic obstructive pulmonary disease, unspecified: Secondary | ICD-10-CM | POA: Diagnosis not present

## 2018-08-29 DIAGNOSIS — R197 Diarrhea, unspecified: Secondary | ICD-10-CM | POA: Diagnosis not present

## 2018-08-29 DIAGNOSIS — Z7902 Long term (current) use of antithrombotics/antiplatelets: Secondary | ICD-10-CM | POA: Diagnosis not present

## 2018-08-29 DIAGNOSIS — E1165 Type 2 diabetes mellitus with hyperglycemia: Secondary | ICD-10-CM | POA: Diagnosis not present

## 2018-08-29 DIAGNOSIS — Z9049 Acquired absence of other specified parts of digestive tract: Secondary | ICD-10-CM | POA: Diagnosis not present

## 2018-08-29 DIAGNOSIS — E7800 Pure hypercholesterolemia, unspecified: Secondary | ICD-10-CM | POA: Diagnosis not present

## 2018-08-29 DIAGNOSIS — K567 Ileus, unspecified: Secondary | ICD-10-CM | POA: Diagnosis not present

## 2018-08-29 DIAGNOSIS — Z794 Long term (current) use of insulin: Secondary | ICD-10-CM | POA: Diagnosis not present

## 2018-08-29 DIAGNOSIS — Z955 Presence of coronary angioplasty implant and graft: Secondary | ICD-10-CM | POA: Diagnosis not present

## 2018-08-29 DIAGNOSIS — C189 Malignant neoplasm of colon, unspecified: Secondary | ICD-10-CM | POA: Diagnosis not present

## 2018-08-29 DIAGNOSIS — Z7982 Long term (current) use of aspirin: Secondary | ICD-10-CM | POA: Diagnosis not present

## 2018-08-29 DIAGNOSIS — I10 Essential (primary) hypertension: Secondary | ICD-10-CM | POA: Diagnosis not present

## 2018-08-29 DIAGNOSIS — I2510 Atherosclerotic heart disease of native coronary artery without angina pectoris: Secondary | ICD-10-CM | POA: Diagnosis not present

## 2018-08-29 DIAGNOSIS — R140 Abdominal distension (gaseous): Secondary | ICD-10-CM | POA: Diagnosis not present

## 2018-08-29 DIAGNOSIS — D649 Anemia, unspecified: Secondary | ICD-10-CM | POA: Diagnosis not present

## 2018-08-29 DIAGNOSIS — E039 Hypothyroidism, unspecified: Secondary | ICD-10-CM | POA: Diagnosis not present

## 2018-08-29 DIAGNOSIS — C182 Malignant neoplasm of ascending colon: Secondary | ICD-10-CM | POA: Diagnosis not present

## 2018-08-29 DIAGNOSIS — I252 Old myocardial infarction: Secondary | ICD-10-CM | POA: Diagnosis not present

## 2018-08-29 DIAGNOSIS — J449 Chronic obstructive pulmonary disease, unspecified: Secondary | ICD-10-CM | POA: Diagnosis not present

## 2018-08-29 DIAGNOSIS — E1140 Type 2 diabetes mellitus with diabetic neuropathy, unspecified: Secondary | ICD-10-CM | POA: Diagnosis not present

## 2018-08-29 DIAGNOSIS — G4733 Obstructive sleep apnea (adult) (pediatric): Secondary | ICD-10-CM | POA: Diagnosis not present

## 2018-08-29 DIAGNOSIS — K219 Gastro-esophageal reflux disease without esophagitis: Secondary | ICD-10-CM | POA: Diagnosis not present

## 2018-09-04 DIAGNOSIS — E7800 Pure hypercholesterolemia, unspecified: Secondary | ICD-10-CM | POA: Diagnosis not present

## 2018-09-04 DIAGNOSIS — Z96651 Presence of right artificial knee joint: Secondary | ICD-10-CM | POA: Diagnosis not present

## 2018-09-04 DIAGNOSIS — I2510 Atherosclerotic heart disease of native coronary artery without angina pectoris: Secondary | ICD-10-CM | POA: Diagnosis not present

## 2018-09-04 DIAGNOSIS — J449 Chronic obstructive pulmonary disease, unspecified: Secondary | ICD-10-CM | POA: Diagnosis not present

## 2018-09-04 DIAGNOSIS — E1165 Type 2 diabetes mellitus with hyperglycemia: Secondary | ICD-10-CM | POA: Diagnosis not present

## 2018-09-04 DIAGNOSIS — I10 Essential (primary) hypertension: Secondary | ICD-10-CM | POA: Diagnosis not present

## 2018-09-04 DIAGNOSIS — Z955 Presence of coronary angioplasty implant and graft: Secondary | ICD-10-CM | POA: Diagnosis not present

## 2018-09-04 DIAGNOSIS — Z483 Aftercare following surgery for neoplasm: Secondary | ICD-10-CM | POA: Diagnosis not present

## 2018-09-04 DIAGNOSIS — G4730 Sleep apnea, unspecified: Secondary | ICD-10-CM | POA: Diagnosis not present

## 2018-09-04 DIAGNOSIS — E1140 Type 2 diabetes mellitus with diabetic neuropathy, unspecified: Secondary | ICD-10-CM | POA: Diagnosis not present

## 2018-09-04 DIAGNOSIS — Z87442 Personal history of urinary calculi: Secondary | ICD-10-CM | POA: Diagnosis not present

## 2018-09-04 DIAGNOSIS — C182 Malignant neoplasm of ascending colon: Secondary | ICD-10-CM | POA: Diagnosis not present

## 2018-09-04 DIAGNOSIS — Z9049 Acquired absence of other specified parts of digestive tract: Secondary | ICD-10-CM | POA: Diagnosis not present

## 2018-09-04 DIAGNOSIS — D630 Anemia in neoplastic disease: Secondary | ICD-10-CM | POA: Diagnosis not present

## 2018-09-04 DIAGNOSIS — E039 Hypothyroidism, unspecified: Secondary | ICD-10-CM | POA: Diagnosis not present

## 2018-09-04 DIAGNOSIS — E1136 Type 2 diabetes mellitus with diabetic cataract: Secondary | ICD-10-CM | POA: Diagnosis not present

## 2018-09-04 DIAGNOSIS — Z7951 Long term (current) use of inhaled steroids: Secondary | ICD-10-CM | POA: Diagnosis not present

## 2018-09-04 DIAGNOSIS — Z794 Long term (current) use of insulin: Secondary | ICD-10-CM | POA: Diagnosis not present

## 2018-09-04 DIAGNOSIS — Z4801 Encounter for change or removal of surgical wound dressing: Secondary | ICD-10-CM | POA: Diagnosis not present

## 2018-09-08 DIAGNOSIS — E1165 Type 2 diabetes mellitus with hyperglycemia: Secondary | ICD-10-CM | POA: Diagnosis not present

## 2018-09-08 DIAGNOSIS — Z955 Presence of coronary angioplasty implant and graft: Secondary | ICD-10-CM | POA: Diagnosis not present

## 2018-09-08 DIAGNOSIS — Z9049 Acquired absence of other specified parts of digestive tract: Secondary | ICD-10-CM | POA: Diagnosis not present

## 2018-09-08 DIAGNOSIS — Z7951 Long term (current) use of inhaled steroids: Secondary | ICD-10-CM | POA: Diagnosis not present

## 2018-09-08 DIAGNOSIS — Z4801 Encounter for change or removal of surgical wound dressing: Secondary | ICD-10-CM | POA: Diagnosis not present

## 2018-09-08 DIAGNOSIS — I10 Essential (primary) hypertension: Secondary | ICD-10-CM | POA: Diagnosis not present

## 2018-09-08 DIAGNOSIS — Z483 Aftercare following surgery for neoplasm: Secondary | ICD-10-CM | POA: Diagnosis not present

## 2018-09-08 DIAGNOSIS — E1136 Type 2 diabetes mellitus with diabetic cataract: Secondary | ICD-10-CM | POA: Diagnosis not present

## 2018-09-08 DIAGNOSIS — Z96651 Presence of right artificial knee joint: Secondary | ICD-10-CM | POA: Diagnosis not present

## 2018-09-08 DIAGNOSIS — E7800 Pure hypercholesterolemia, unspecified: Secondary | ICD-10-CM | POA: Diagnosis not present

## 2018-09-08 DIAGNOSIS — E039 Hypothyroidism, unspecified: Secondary | ICD-10-CM | POA: Diagnosis not present

## 2018-09-08 DIAGNOSIS — Z794 Long term (current) use of insulin: Secondary | ICD-10-CM | POA: Diagnosis not present

## 2018-09-08 DIAGNOSIS — Z87442 Personal history of urinary calculi: Secondary | ICD-10-CM | POA: Diagnosis not present

## 2018-09-08 DIAGNOSIS — C182 Malignant neoplasm of ascending colon: Secondary | ICD-10-CM | POA: Diagnosis not present

## 2018-09-08 DIAGNOSIS — G4730 Sleep apnea, unspecified: Secondary | ICD-10-CM | POA: Diagnosis not present

## 2018-09-08 DIAGNOSIS — I2510 Atherosclerotic heart disease of native coronary artery without angina pectoris: Secondary | ICD-10-CM | POA: Diagnosis not present

## 2018-09-08 DIAGNOSIS — J449 Chronic obstructive pulmonary disease, unspecified: Secondary | ICD-10-CM | POA: Diagnosis not present

## 2018-09-08 DIAGNOSIS — D630 Anemia in neoplastic disease: Secondary | ICD-10-CM | POA: Diagnosis not present

## 2018-09-08 DIAGNOSIS — E1140 Type 2 diabetes mellitus with diabetic neuropathy, unspecified: Secondary | ICD-10-CM | POA: Diagnosis not present

## 2018-09-12 DIAGNOSIS — R109 Unspecified abdominal pain: Secondary | ICD-10-CM | POA: Diagnosis not present

## 2018-09-13 DIAGNOSIS — Z794 Long term (current) use of insulin: Secondary | ICD-10-CM | POA: Diagnosis not present

## 2018-09-13 DIAGNOSIS — E1140 Type 2 diabetes mellitus with diabetic neuropathy, unspecified: Secondary | ICD-10-CM | POA: Diagnosis not present

## 2018-09-13 DIAGNOSIS — Z9049 Acquired absence of other specified parts of digestive tract: Secondary | ICD-10-CM | POA: Diagnosis not present

## 2018-09-13 DIAGNOSIS — D630 Anemia in neoplastic disease: Secondary | ICD-10-CM | POA: Diagnosis not present

## 2018-09-13 DIAGNOSIS — E1136 Type 2 diabetes mellitus with diabetic cataract: Secondary | ICD-10-CM | POA: Diagnosis not present

## 2018-09-13 DIAGNOSIS — Z96651 Presence of right artificial knee joint: Secondary | ICD-10-CM | POA: Diagnosis not present

## 2018-09-13 DIAGNOSIS — E7800 Pure hypercholesterolemia, unspecified: Secondary | ICD-10-CM | POA: Diagnosis not present

## 2018-09-13 DIAGNOSIS — Z483 Aftercare following surgery for neoplasm: Secondary | ICD-10-CM | POA: Diagnosis not present

## 2018-09-13 DIAGNOSIS — Z4801 Encounter for change or removal of surgical wound dressing: Secondary | ICD-10-CM | POA: Diagnosis not present

## 2018-09-13 DIAGNOSIS — I2510 Atherosclerotic heart disease of native coronary artery without angina pectoris: Secondary | ICD-10-CM | POA: Diagnosis not present

## 2018-09-13 DIAGNOSIS — Z87442 Personal history of urinary calculi: Secondary | ICD-10-CM | POA: Diagnosis not present

## 2018-09-13 DIAGNOSIS — C182 Malignant neoplasm of ascending colon: Secondary | ICD-10-CM | POA: Diagnosis not present

## 2018-09-13 DIAGNOSIS — G4730 Sleep apnea, unspecified: Secondary | ICD-10-CM | POA: Diagnosis not present

## 2018-09-13 DIAGNOSIS — Z955 Presence of coronary angioplasty implant and graft: Secondary | ICD-10-CM | POA: Diagnosis not present

## 2018-09-13 DIAGNOSIS — E1165 Type 2 diabetes mellitus with hyperglycemia: Secondary | ICD-10-CM | POA: Diagnosis not present

## 2018-09-13 DIAGNOSIS — E039 Hypothyroidism, unspecified: Secondary | ICD-10-CM | POA: Diagnosis not present

## 2018-09-13 DIAGNOSIS — Z7951 Long term (current) use of inhaled steroids: Secondary | ICD-10-CM | POA: Diagnosis not present

## 2018-09-13 DIAGNOSIS — J449 Chronic obstructive pulmonary disease, unspecified: Secondary | ICD-10-CM | POA: Diagnosis not present

## 2018-09-13 DIAGNOSIS — I10 Essential (primary) hypertension: Secondary | ICD-10-CM | POA: Diagnosis not present

## 2018-09-15 DIAGNOSIS — E1136 Type 2 diabetes mellitus with diabetic cataract: Secondary | ICD-10-CM | POA: Diagnosis not present

## 2018-09-15 DIAGNOSIS — E039 Hypothyroidism, unspecified: Secondary | ICD-10-CM | POA: Diagnosis not present

## 2018-09-15 DIAGNOSIS — E1140 Type 2 diabetes mellitus with diabetic neuropathy, unspecified: Secondary | ICD-10-CM | POA: Diagnosis not present

## 2018-09-15 DIAGNOSIS — J449 Chronic obstructive pulmonary disease, unspecified: Secondary | ICD-10-CM | POA: Diagnosis not present

## 2018-09-15 DIAGNOSIS — C182 Malignant neoplasm of ascending colon: Secondary | ICD-10-CM | POA: Diagnosis not present

## 2018-09-15 DIAGNOSIS — I2510 Atherosclerotic heart disease of native coronary artery without angina pectoris: Secondary | ICD-10-CM | POA: Diagnosis not present

## 2018-09-15 DIAGNOSIS — Z9049 Acquired absence of other specified parts of digestive tract: Secondary | ICD-10-CM | POA: Diagnosis not present

## 2018-09-15 DIAGNOSIS — I10 Essential (primary) hypertension: Secondary | ICD-10-CM | POA: Diagnosis not present

## 2018-09-15 DIAGNOSIS — E1165 Type 2 diabetes mellitus with hyperglycemia: Secondary | ICD-10-CM | POA: Diagnosis not present

## 2018-09-15 DIAGNOSIS — Z955 Presence of coronary angioplasty implant and graft: Secondary | ICD-10-CM | POA: Diagnosis not present

## 2018-09-15 DIAGNOSIS — E7800 Pure hypercholesterolemia, unspecified: Secondary | ICD-10-CM | POA: Diagnosis not present

## 2018-09-15 DIAGNOSIS — Z4801 Encounter for change or removal of surgical wound dressing: Secondary | ICD-10-CM | POA: Diagnosis not present

## 2018-09-15 DIAGNOSIS — Z96651 Presence of right artificial knee joint: Secondary | ICD-10-CM | POA: Diagnosis not present

## 2018-09-15 DIAGNOSIS — Z483 Aftercare following surgery for neoplasm: Secondary | ICD-10-CM | POA: Diagnosis not present

## 2018-09-15 DIAGNOSIS — Z7951 Long term (current) use of inhaled steroids: Secondary | ICD-10-CM | POA: Diagnosis not present

## 2018-09-15 DIAGNOSIS — G4730 Sleep apnea, unspecified: Secondary | ICD-10-CM | POA: Diagnosis not present

## 2018-09-15 DIAGNOSIS — Z794 Long term (current) use of insulin: Secondary | ICD-10-CM | POA: Diagnosis not present

## 2018-09-15 DIAGNOSIS — Z87442 Personal history of urinary calculi: Secondary | ICD-10-CM | POA: Diagnosis not present

## 2018-09-15 DIAGNOSIS — D630 Anemia in neoplastic disease: Secondary | ICD-10-CM | POA: Diagnosis not present

## 2018-09-19 DIAGNOSIS — Z96651 Presence of right artificial knee joint: Secondary | ICD-10-CM | POA: Diagnosis not present

## 2018-09-19 DIAGNOSIS — C182 Malignant neoplasm of ascending colon: Secondary | ICD-10-CM | POA: Diagnosis not present

## 2018-09-19 DIAGNOSIS — Z7951 Long term (current) use of inhaled steroids: Secondary | ICD-10-CM | POA: Diagnosis not present

## 2018-09-19 DIAGNOSIS — I10 Essential (primary) hypertension: Secondary | ICD-10-CM | POA: Diagnosis not present

## 2018-09-19 DIAGNOSIS — J449 Chronic obstructive pulmonary disease, unspecified: Secondary | ICD-10-CM | POA: Diagnosis not present

## 2018-09-19 DIAGNOSIS — E039 Hypothyroidism, unspecified: Secondary | ICD-10-CM | POA: Diagnosis not present

## 2018-09-19 DIAGNOSIS — G4730 Sleep apnea, unspecified: Secondary | ICD-10-CM | POA: Diagnosis not present

## 2018-09-19 DIAGNOSIS — Z794 Long term (current) use of insulin: Secondary | ICD-10-CM | POA: Diagnosis not present

## 2018-09-19 DIAGNOSIS — E1140 Type 2 diabetes mellitus with diabetic neuropathy, unspecified: Secondary | ICD-10-CM | POA: Diagnosis not present

## 2018-09-19 DIAGNOSIS — Z955 Presence of coronary angioplasty implant and graft: Secondary | ICD-10-CM | POA: Diagnosis not present

## 2018-09-19 DIAGNOSIS — Z483 Aftercare following surgery for neoplasm: Secondary | ICD-10-CM | POA: Diagnosis not present

## 2018-09-19 DIAGNOSIS — Z87442 Personal history of urinary calculi: Secondary | ICD-10-CM | POA: Diagnosis not present

## 2018-09-19 DIAGNOSIS — Z9049 Acquired absence of other specified parts of digestive tract: Secondary | ICD-10-CM | POA: Diagnosis not present

## 2018-09-19 DIAGNOSIS — E1165 Type 2 diabetes mellitus with hyperglycemia: Secondary | ICD-10-CM | POA: Diagnosis not present

## 2018-09-19 DIAGNOSIS — D630 Anemia in neoplastic disease: Secondary | ICD-10-CM | POA: Diagnosis not present

## 2018-09-19 DIAGNOSIS — E7800 Pure hypercholesterolemia, unspecified: Secondary | ICD-10-CM | POA: Diagnosis not present

## 2018-09-19 DIAGNOSIS — I2510 Atherosclerotic heart disease of native coronary artery without angina pectoris: Secondary | ICD-10-CM | POA: Diagnosis not present

## 2018-09-19 DIAGNOSIS — E1136 Type 2 diabetes mellitus with diabetic cataract: Secondary | ICD-10-CM | POA: Diagnosis not present

## 2018-09-19 DIAGNOSIS — Z4801 Encounter for change or removal of surgical wound dressing: Secondary | ICD-10-CM | POA: Diagnosis not present

## 2018-09-21 DIAGNOSIS — Z7982 Long term (current) use of aspirin: Secondary | ICD-10-CM | POA: Diagnosis not present

## 2018-09-21 DIAGNOSIS — N179 Acute kidney failure, unspecified: Secondary | ICD-10-CM | POA: Diagnosis not present

## 2018-09-21 DIAGNOSIS — E119 Type 2 diabetes mellitus without complications: Secondary | ICD-10-CM | POA: Diagnosis not present

## 2018-09-21 DIAGNOSIS — Z9049 Acquired absence of other specified parts of digestive tract: Secondary | ICD-10-CM | POA: Diagnosis not present

## 2018-09-21 DIAGNOSIS — G4733 Obstructive sleep apnea (adult) (pediatric): Secondary | ICD-10-CM | POA: Diagnosis not present

## 2018-09-21 DIAGNOSIS — E7800 Pure hypercholesterolemia, unspecified: Secondary | ICD-10-CM | POA: Diagnosis not present

## 2018-09-21 DIAGNOSIS — I252 Old myocardial infarction: Secondary | ICD-10-CM | POA: Diagnosis not present

## 2018-09-21 DIAGNOSIS — Z955 Presence of coronary angioplasty implant and graft: Secondary | ICD-10-CM | POA: Diagnosis not present

## 2018-09-21 DIAGNOSIS — I2510 Atherosclerotic heart disease of native coronary artery without angina pectoris: Secondary | ICD-10-CM | POA: Diagnosis not present

## 2018-09-21 DIAGNOSIS — K7689 Other specified diseases of liver: Secondary | ICD-10-CM | POA: Diagnosis not present

## 2018-09-21 DIAGNOSIS — Z79899 Other long term (current) drug therapy: Secondary | ICD-10-CM | POA: Diagnosis not present

## 2018-09-21 DIAGNOSIS — R197 Diarrhea, unspecified: Secondary | ICD-10-CM | POA: Diagnosis not present

## 2018-09-21 DIAGNOSIS — M1990 Unspecified osteoarthritis, unspecified site: Secondary | ICD-10-CM | POA: Diagnosis not present

## 2018-09-21 DIAGNOSIS — C189 Malignant neoplasm of colon, unspecified: Secondary | ICD-10-CM | POA: Diagnosis not present

## 2018-09-21 DIAGNOSIS — Z794 Long term (current) use of insulin: Secondary | ICD-10-CM | POA: Diagnosis not present

## 2018-09-21 DIAGNOSIS — G8918 Other acute postprocedural pain: Secondary | ICD-10-CM | POA: Diagnosis not present

## 2018-09-21 DIAGNOSIS — R262 Difficulty in walking, not elsewhere classified: Secondary | ICD-10-CM | POA: Diagnosis not present

## 2018-09-21 DIAGNOSIS — E039 Hypothyroidism, unspecified: Secondary | ICD-10-CM | POA: Diagnosis not present

## 2018-09-21 DIAGNOSIS — K219 Gastro-esophageal reflux disease without esophagitis: Secondary | ICD-10-CM | POA: Diagnosis not present

## 2018-09-21 DIAGNOSIS — I10 Essential (primary) hypertension: Secondary | ICD-10-CM | POA: Diagnosis not present

## 2018-09-21 DIAGNOSIS — R109 Unspecified abdominal pain: Secondary | ICD-10-CM | POA: Diagnosis not present

## 2018-09-21 DIAGNOSIS — E860 Dehydration: Secondary | ICD-10-CM | POA: Diagnosis not present

## 2018-09-21 DIAGNOSIS — Z85828 Personal history of other malignant neoplasm of skin: Secondary | ICD-10-CM | POA: Diagnosis not present

## 2018-09-21 DIAGNOSIS — J449 Chronic obstructive pulmonary disease, unspecified: Secondary | ICD-10-CM | POA: Diagnosis not present

## 2018-09-21 DIAGNOSIS — R112 Nausea with vomiting, unspecified: Secondary | ICD-10-CM | POA: Diagnosis not present

## 2018-09-22 DIAGNOSIS — M1990 Unspecified osteoarthritis, unspecified site: Secondary | ICD-10-CM | POA: Diagnosis not present

## 2018-09-22 DIAGNOSIS — Z955 Presence of coronary angioplasty implant and graft: Secondary | ICD-10-CM | POA: Diagnosis not present

## 2018-09-22 DIAGNOSIS — E119 Type 2 diabetes mellitus without complications: Secondary | ICD-10-CM | POA: Diagnosis not present

## 2018-09-22 DIAGNOSIS — R109 Unspecified abdominal pain: Secondary | ICD-10-CM | POA: Diagnosis not present

## 2018-09-22 DIAGNOSIS — G4733 Obstructive sleep apnea (adult) (pediatric): Secondary | ICD-10-CM | POA: Diagnosis not present

## 2018-09-22 DIAGNOSIS — G8918 Other acute postprocedural pain: Secondary | ICD-10-CM | POA: Diagnosis not present

## 2018-09-22 DIAGNOSIS — R197 Diarrhea, unspecified: Secondary | ICD-10-CM | POA: Diagnosis not present

## 2018-09-22 DIAGNOSIS — Z7982 Long term (current) use of aspirin: Secondary | ICD-10-CM | POA: Diagnosis not present

## 2018-09-22 DIAGNOSIS — J449 Chronic obstructive pulmonary disease, unspecified: Secondary | ICD-10-CM | POA: Diagnosis not present

## 2018-09-22 DIAGNOSIS — N179 Acute kidney failure, unspecified: Secondary | ICD-10-CM | POA: Diagnosis not present

## 2018-09-22 DIAGNOSIS — R262 Difficulty in walking, not elsewhere classified: Secondary | ICD-10-CM | POA: Diagnosis not present

## 2018-09-22 DIAGNOSIS — E1165 Type 2 diabetes mellitus with hyperglycemia: Secondary | ICD-10-CM | POA: Diagnosis not present

## 2018-09-22 DIAGNOSIS — E7800 Pure hypercholesterolemia, unspecified: Secondary | ICD-10-CM | POA: Diagnosis not present

## 2018-09-22 DIAGNOSIS — C189 Malignant neoplasm of colon, unspecified: Secondary | ICD-10-CM | POA: Diagnosis not present

## 2018-09-22 DIAGNOSIS — R112 Nausea with vomiting, unspecified: Secondary | ICD-10-CM | POA: Diagnosis not present

## 2018-09-22 DIAGNOSIS — E860 Dehydration: Secondary | ICD-10-CM | POA: Diagnosis not present

## 2018-09-22 DIAGNOSIS — Z85828 Personal history of other malignant neoplasm of skin: Secondary | ICD-10-CM | POA: Diagnosis not present

## 2018-09-22 DIAGNOSIS — I10 Essential (primary) hypertension: Secondary | ICD-10-CM | POA: Diagnosis not present

## 2018-09-22 DIAGNOSIS — K219 Gastro-esophageal reflux disease without esophagitis: Secondary | ICD-10-CM | POA: Diagnosis not present

## 2018-09-22 DIAGNOSIS — Z9049 Acquired absence of other specified parts of digestive tract: Secondary | ICD-10-CM | POA: Diagnosis not present

## 2018-09-22 DIAGNOSIS — Z794 Long term (current) use of insulin: Secondary | ICD-10-CM | POA: Diagnosis not present

## 2018-09-22 DIAGNOSIS — I2510 Atherosclerotic heart disease of native coronary artery without angina pectoris: Secondary | ICD-10-CM | POA: Diagnosis not present

## 2018-09-22 DIAGNOSIS — Z79899 Other long term (current) drug therapy: Secondary | ICD-10-CM | POA: Diagnosis not present

## 2018-09-22 DIAGNOSIS — I252 Old myocardial infarction: Secondary | ICD-10-CM | POA: Diagnosis not present

## 2018-09-22 DIAGNOSIS — E039 Hypothyroidism, unspecified: Secondary | ICD-10-CM | POA: Diagnosis not present

## 2018-09-23 DIAGNOSIS — I252 Old myocardial infarction: Secondary | ICD-10-CM | POA: Diagnosis not present

## 2018-09-23 DIAGNOSIS — R109 Unspecified abdominal pain: Secondary | ICD-10-CM | POA: Diagnosis not present

## 2018-09-23 DIAGNOSIS — Z794 Long term (current) use of insulin: Secondary | ICD-10-CM | POA: Diagnosis not present

## 2018-09-23 DIAGNOSIS — C189 Malignant neoplasm of colon, unspecified: Secondary | ICD-10-CM | POA: Diagnosis not present

## 2018-09-23 DIAGNOSIS — Z79899 Other long term (current) drug therapy: Secondary | ICD-10-CM | POA: Diagnosis not present

## 2018-09-23 DIAGNOSIS — G4733 Obstructive sleep apnea (adult) (pediatric): Secondary | ICD-10-CM | POA: Diagnosis not present

## 2018-09-23 DIAGNOSIS — Z9049 Acquired absence of other specified parts of digestive tract: Secondary | ICD-10-CM | POA: Diagnosis not present

## 2018-09-23 DIAGNOSIS — N179 Acute kidney failure, unspecified: Secondary | ICD-10-CM | POA: Diagnosis not present

## 2018-09-23 DIAGNOSIS — Z7982 Long term (current) use of aspirin: Secondary | ICD-10-CM | POA: Diagnosis not present

## 2018-09-23 DIAGNOSIS — E119 Type 2 diabetes mellitus without complications: Secondary | ICD-10-CM | POA: Diagnosis not present

## 2018-09-23 DIAGNOSIS — R112 Nausea with vomiting, unspecified: Secondary | ICD-10-CM | POA: Diagnosis not present

## 2018-09-23 DIAGNOSIS — K219 Gastro-esophageal reflux disease without esophagitis: Secondary | ICD-10-CM | POA: Diagnosis not present

## 2018-09-23 DIAGNOSIS — I2510 Atherosclerotic heart disease of native coronary artery without angina pectoris: Secondary | ICD-10-CM | POA: Diagnosis not present

## 2018-09-23 DIAGNOSIS — I10 Essential (primary) hypertension: Secondary | ICD-10-CM | POA: Diagnosis not present

## 2018-09-23 DIAGNOSIS — M1990 Unspecified osteoarthritis, unspecified site: Secondary | ICD-10-CM | POA: Diagnosis not present

## 2018-09-23 DIAGNOSIS — E7800 Pure hypercholesterolemia, unspecified: Secondary | ICD-10-CM | POA: Diagnosis not present

## 2018-09-23 DIAGNOSIS — R262 Difficulty in walking, not elsewhere classified: Secondary | ICD-10-CM | POA: Diagnosis not present

## 2018-09-23 DIAGNOSIS — E039 Hypothyroidism, unspecified: Secondary | ICD-10-CM | POA: Diagnosis not present

## 2018-09-23 DIAGNOSIS — Z955 Presence of coronary angioplasty implant and graft: Secondary | ICD-10-CM | POA: Diagnosis not present

## 2018-09-23 DIAGNOSIS — E860 Dehydration: Secondary | ICD-10-CM | POA: Diagnosis not present

## 2018-09-23 DIAGNOSIS — Z85828 Personal history of other malignant neoplasm of skin: Secondary | ICD-10-CM | POA: Diagnosis not present

## 2018-09-23 DIAGNOSIS — R197 Diarrhea, unspecified: Secondary | ICD-10-CM | POA: Diagnosis not present

## 2018-09-23 DIAGNOSIS — G8918 Other acute postprocedural pain: Secondary | ICD-10-CM | POA: Diagnosis not present

## 2018-09-23 DIAGNOSIS — J449 Chronic obstructive pulmonary disease, unspecified: Secondary | ICD-10-CM | POA: Diagnosis not present

## 2018-09-28 DIAGNOSIS — J449 Chronic obstructive pulmonary disease, unspecified: Secondary | ICD-10-CM | POA: Diagnosis not present

## 2018-10-04 DIAGNOSIS — E1121 Type 2 diabetes mellitus with diabetic nephropathy: Secondary | ICD-10-CM | POA: Diagnosis not present

## 2018-10-04 DIAGNOSIS — I2510 Atherosclerotic heart disease of native coronary artery without angina pectoris: Secondary | ICD-10-CM | POA: Diagnosis not present

## 2018-10-04 DIAGNOSIS — Z23 Encounter for immunization: Secondary | ICD-10-CM | POA: Diagnosis not present

## 2018-10-04 DIAGNOSIS — I10 Essential (primary) hypertension: Secondary | ICD-10-CM | POA: Diagnosis not present

## 2018-10-04 DIAGNOSIS — E785 Hyperlipidemia, unspecified: Secondary | ICD-10-CM | POA: Diagnosis not present

## 2018-10-05 ENCOUNTER — Other Ambulatory Visit (HOSPITAL_COMMUNITY): Payer: Self-pay | Admitting: Pulmonary Disease

## 2018-10-05 DIAGNOSIS — Z1231 Encounter for screening mammogram for malignant neoplasm of breast: Secondary | ICD-10-CM

## 2018-10-14 ENCOUNTER — Other Ambulatory Visit: Payer: Self-pay | Admitting: Cardiology

## 2018-10-29 DIAGNOSIS — J449 Chronic obstructive pulmonary disease, unspecified: Secondary | ICD-10-CM | POA: Diagnosis not present

## 2018-11-03 DIAGNOSIS — C189 Malignant neoplasm of colon, unspecified: Secondary | ICD-10-CM | POA: Diagnosis not present

## 2018-11-03 DIAGNOSIS — I10 Essential (primary) hypertension: Secondary | ICD-10-CM | POA: Diagnosis not present

## 2018-11-09 DIAGNOSIS — D500 Iron deficiency anemia secondary to blood loss (chronic): Secondary | ICD-10-CM | POA: Diagnosis not present

## 2018-11-09 DIAGNOSIS — I10 Essential (primary) hypertension: Secondary | ICD-10-CM | POA: Diagnosis not present

## 2018-11-09 DIAGNOSIS — C182 Malignant neoplasm of ascending colon: Secondary | ICD-10-CM | POA: Diagnosis not present

## 2018-11-13 ENCOUNTER — Other Ambulatory Visit: Payer: Self-pay | Admitting: Cardiology

## 2018-11-15 ENCOUNTER — Other Ambulatory Visit: Payer: Self-pay | Admitting: Physician Assistant

## 2018-11-15 DIAGNOSIS — Z79899 Other long term (current) drug therapy: Secondary | ICD-10-CM | POA: Diagnosis not present

## 2018-11-15 DIAGNOSIS — M130 Polyarthritis, unspecified: Secondary | ICD-10-CM | POA: Diagnosis not present

## 2018-11-15 DIAGNOSIS — G8929 Other chronic pain: Secondary | ICD-10-CM | POA: Diagnosis not present

## 2018-11-16 ENCOUNTER — Ambulatory Visit (HOSPITAL_COMMUNITY): Payer: Medicare Other

## 2018-11-17 ENCOUNTER — Other Ambulatory Visit: Payer: Self-pay | Admitting: Interventional Cardiology

## 2018-11-17 DIAGNOSIS — E1169 Type 2 diabetes mellitus with other specified complication: Secondary | ICD-10-CM | POA: Diagnosis not present

## 2018-11-17 DIAGNOSIS — D500 Iron deficiency anemia secondary to blood loss (chronic): Secondary | ICD-10-CM | POA: Diagnosis not present

## 2018-11-17 DIAGNOSIS — C182 Malignant neoplasm of ascending colon: Secondary | ICD-10-CM | POA: Diagnosis not present

## 2018-11-17 DIAGNOSIS — E538 Deficiency of other specified B group vitamins: Secondary | ICD-10-CM | POA: Diagnosis not present

## 2018-11-17 DIAGNOSIS — E539 Vitamin B deficiency, unspecified: Secondary | ICD-10-CM | POA: Diagnosis not present

## 2018-11-18 DIAGNOSIS — E538 Deficiency of other specified B group vitamins: Secondary | ICD-10-CM | POA: Diagnosis not present

## 2018-11-23 ENCOUNTER — Ambulatory Visit (HOSPITAL_COMMUNITY)
Admission: RE | Admit: 2018-11-23 | Discharge: 2018-11-23 | Disposition: A | Discharge status: Home / Self Care | Payer: Medicare Other | Source: Ambulatory Visit | Attending: Pulmonary Disease | Admitting: Pulmonary Disease

## 2018-11-23 ENCOUNTER — Other Ambulatory Visit: Payer: Self-pay

## 2018-11-23 DIAGNOSIS — Z1231 Encounter for screening mammogram for malignant neoplasm of breast: Secondary | ICD-10-CM

## 2018-11-28 DIAGNOSIS — J449 Chronic obstructive pulmonary disease, unspecified: Secondary | ICD-10-CM | POA: Diagnosis not present

## 2018-12-01 DIAGNOSIS — H5211 Myopia, right eye: Secondary | ICD-10-CM | POA: Diagnosis not present

## 2018-12-01 DIAGNOSIS — H524 Presbyopia: Secondary | ICD-10-CM | POA: Diagnosis not present

## 2018-12-01 DIAGNOSIS — E119 Type 2 diabetes mellitus without complications: Secondary | ICD-10-CM | POA: Diagnosis not present

## 2018-12-01 DIAGNOSIS — H52221 Regular astigmatism, right eye: Secondary | ICD-10-CM | POA: Diagnosis not present

## 2018-12-01 DIAGNOSIS — H2513 Age-related nuclear cataract, bilateral: Secondary | ICD-10-CM | POA: Diagnosis not present

## 2018-12-09 ENCOUNTER — Other Ambulatory Visit: Payer: Self-pay | Admitting: Cardiology

## 2018-12-12 DIAGNOSIS — I10 Essential (primary) hypertension: Secondary | ICD-10-CM | POA: Diagnosis not present

## 2018-12-12 DIAGNOSIS — E1121 Type 2 diabetes mellitus with diabetic nephropathy: Secondary | ICD-10-CM | POA: Diagnosis not present

## 2018-12-12 DIAGNOSIS — C189 Malignant neoplasm of colon, unspecified: Secondary | ICD-10-CM | POA: Diagnosis not present

## 2018-12-12 DIAGNOSIS — I2510 Atherosclerotic heart disease of native coronary artery without angina pectoris: Secondary | ICD-10-CM | POA: Diagnosis not present

## 2018-12-15 DIAGNOSIS — Z79899 Other long term (current) drug therapy: Secondary | ICD-10-CM | POA: Diagnosis not present

## 2018-12-15 DIAGNOSIS — M130 Polyarthritis, unspecified: Secondary | ICD-10-CM | POA: Diagnosis not present

## 2018-12-15 DIAGNOSIS — G8929 Other chronic pain: Secondary | ICD-10-CM | POA: Diagnosis not present

## 2018-12-16 DIAGNOSIS — E538 Deficiency of other specified B group vitamins: Secondary | ICD-10-CM | POA: Diagnosis not present

## 2018-12-16 DIAGNOSIS — D500 Iron deficiency anemia secondary to blood loss (chronic): Secondary | ICD-10-CM | POA: Diagnosis not present

## 2018-12-16 DIAGNOSIS — N289 Disorder of kidney and ureter, unspecified: Secondary | ICD-10-CM | POA: Diagnosis not present

## 2018-12-16 DIAGNOSIS — C182 Malignant neoplasm of ascending colon: Secondary | ICD-10-CM | POA: Diagnosis not present

## 2018-12-18 ENCOUNTER — Other Ambulatory Visit: Payer: Self-pay | Admitting: Interventional Cardiology

## 2018-12-19 DIAGNOSIS — J449 Chronic obstructive pulmonary disease, unspecified: Secondary | ICD-10-CM | POA: Diagnosis not present

## 2018-12-22 DIAGNOSIS — G4733 Obstructive sleep apnea (adult) (pediatric): Secondary | ICD-10-CM | POA: Diagnosis not present

## 2018-12-29 DIAGNOSIS — J449 Chronic obstructive pulmonary disease, unspecified: Secondary | ICD-10-CM | POA: Diagnosis not present

## 2019-03-01 ENCOUNTER — Encounter: Payer: Self-pay | Admitting: Urology

## 2019-03-01 ENCOUNTER — Ambulatory Visit (INDEPENDENT_AMBULATORY_CARE_PROVIDER_SITE_OTHER): Payer: Medicare Other | Admitting: Urology

## 2019-03-01 ENCOUNTER — Other Ambulatory Visit: Payer: Self-pay

## 2019-03-01 VITALS — BP 134/73 | HR 80 | Temp 96.1°F | Ht 64.0 in | Wt 215.0 lb

## 2019-03-01 DIAGNOSIS — N3021 Other chronic cystitis with hematuria: Secondary | ICD-10-CM | POA: Insufficient documentation

## 2019-03-01 DIAGNOSIS — R350 Frequency of micturition: Secondary | ICD-10-CM | POA: Diagnosis not present

## 2019-03-01 LAB — POCT URINALYSIS DIPSTICK
Bilirubin, UA: NEGATIVE
Blood, UA: NEGATIVE
Glucose, UA: POSITIVE — AB
Ketones, UA: NEGATIVE
Leukocytes, UA: NEGATIVE
Nitrite, UA: NEGATIVE
Protein, UA: NEGATIVE
Spec Grav, UA: 1.01 (ref 1.010–1.025)
Urobilinogen, UA: 0.2 E.U./dL
pH, UA: 5 (ref 5.0–8.0)

## 2019-03-01 LAB — BLADDER SCAN AMB NON-IMAGING: Scan Result: 66.1

## 2019-03-01 MED ORDER — NITROFURANTOIN MACROCRYSTAL 50 MG PO CAPS: 50.0000 mg | ORAL_CAPSULE | Freq: Every day | ORAL | 11 refills | Status: DC

## 2019-03-01 MED ORDER — MIRABEGRON ER 25 MG PO TB24: 25.0000 mg | ORAL_TABLET | Freq: Every day | ORAL | 0 refills | Status: DC

## 2019-03-01 NOTE — Patient Instructions (Signed)

## 2019-03-01 NOTE — Progress Notes (Signed)
Urological Symptom Review  Patient is experiencing the following symptoms: Frequent urination Burning/pain with urination Get up at night to urinate Stream starts and stops Trouble starting stream Blood in urine Urinary tract infection Weak stream   Review of Systems  Gastrointestinal (upper)  : Negative for upper GI symptoms  Gastrointestinal (lower) : Negative for lower GI symptoms  Constitutional : Night Sweats  Skin: Negative for skin symptoms  Eyes: Blurred vision  Ear/Nose/Throat : Negative for Ear/Nose/Throat symptoms  Hematologic/Lymphatic: Easy bruising  Cardiovascular : Leg swelling  Respiratory : Shortness of breath  Endocrine: Excessive thirst  Musculoskeletal: Back pain Joint pain  Neurological: Dizziness  Psychologic: Depression Anxiety

## 2019-03-01 NOTE — Progress Notes (Signed)
03/01/2019 3:37 PM   [name] [birth year] [id number]  Referring provider: Sinda Du, MD No address on file  Recurrent UTI  HPI: [name] is a 64yo here for evaluation of recurrent UTI and urinary frequency. FOr the past 10 years she gets a feeling of incomplete emptying, urinary frequency, and UTI symptoms. She previously saw Dr. Michela Pitcher who did a hydrodistention and urethral dilation in the office which did relieve her symptoms. She has associated urinary urgency but rare urge incontinence. No prior treatment for UTIs. She states she gets the symptoms of a UTI. LAst UTI was treated 3 weeks ago. No previous OAB treatment. PVR 66cc.    PMH: Past Medical History:  Diagnosis Date  . Acid reflux   . Anxiety   . Arthritis   . Arthritis   . Asthma   . Cancer, colon ([place])   . Chest pain   . Coronary artery disease    a. LAD PCI in 2005 b. cath in 2014 showed patent pLAD stent c. Cath 06/11/17 severe ISR of overlapping Cypher DES in pLAD s/p cutting ballon PTCA only; mild dLAD dz  . Diabetes mellitus   . Diabetic neuropathy ([place])   . GERD (gastroesophageal reflux disease)   . Heart disease   . History of skin cancer   . Hypercholesteremia   . Hypertension   . Hypothyroidism   . Myocardial infarction ([place])   . Panic attack   . Sleep apnea   . Sleep apnea   . Thyroid disease     Surgical History: Past Surgical History:  Procedure Laterality Date  . ABDOMINAL HYSTERECTOMY    . BACK SURGERY    . CHOLECYSTECTOMY    . CORONARY ANGIOPLASTY WITH STENT PLACEMENT    . CORONARY BALLOON ANGIOPLASTY N/A 06/11/2017   Procedure: CORONARY BALLOON ANGIOPLASTY;  Surgeon: Leonie Man, MD;  Location: [facility];  Service: Cardiovascular;  Laterality: N/A;  LAD  . CORONARY STENT INTERVENTION  09/07/2017   STENT SYNERGY DES 3X24  . CORONARY STENT INTERVENTION N/A 09/07/2017   Procedure: CORONARY STENT INTERVENTION;  Surgeon: Jettie Booze, MD;  Location: [facility];  Service: Cardiovascular;  Laterality: N/A;  . ELBOW SURGERY    . KNEE SURGERY    . LEFT HEART CATH AND CORONARY ANGIOGRAPHY N/A 06/11/2017   Procedure: LEFT HEART CATH AND CORONARY ANGIOGRAPHY;  Surgeon: Leonie Man, MD;  Location: [facility];  Service: Cardiovascular;  Laterality: N/A;  . LEFT HEART CATH AND CORONARY ANGIOGRAPHY N/A 09/07/2017   Procedure: LEFT HEART CATH AND CORONARY ANGIOGRAPHY;  Surgeon: Jettie Booze, MD;  Location: [facility];  Service: Cardiovascular;  Laterality: N/A;  . LEFT HEART CATH AND CORONARY ANGIOGRAPHY N/A 11/26/2017   Procedure: LEFT HEART CATH AND CORONARY ANGIOGRAPHY;  Surgeon: Belva Crome, MD;  Location: [facility];  Service: Cardiovascular;  Laterality: N/A;  . LEFT HEART CATHETERIZATION WITH CORONARY ANGIOGRAM N/A 06/23/2012   Procedure: LEFT HEART CATHETERIZATION WITH CORONARY ANGIOGRAM;  Surgeon: Sinclair Grooms, MD;  Location: [facility];  Service: Cardiovascular;  Laterality: N/A;    Home Medications:  Allergies as of 03/01/2019      Reactions   Tape Other (See Comments)   Adhesive breaks me out (rash)   Morphine Itching      Medication List       Accurate as of March 01, 2019  3:37 PM. If you have any questions, ask  your nurse or doctor.        Accu-Chek Aviva Plus test strip Generic drug: glucose blood USE TO CHECK BLOOD SUGAR TWO TIMES DAILY   acyclovir 800 MG tablet Commonly known as: ZOVIRAX TAKE 1 TABLET BY MOUTH FIVE TIMES A DAY   albuterol (2.5 MG/3ML) 0.083% nebulizer solution Commonly known as: PROVENTIL   amLODipine 2.5 MG tablet Commonly known as: NORVASC Take 1 tablet (2.5 mg total) by mouth daily.   aspirin 81 MG EC tablet Take by mouth.   aspirin EC 81 MG tablet Take 81 mg by mouth daily.   B-D ULTRAFINE III SHORT PEN 31G X 8 MM Misc Generic drug: Insulin Pen Needle Use as directed.   B-D ULTRAFINE III SHORT PEN 31G X 8 MM Misc Generic drug: Insulin  Pen Needle See admin instructions.   Symbicort 160-4.5 MCG/ACT inhaler Generic drug: budesonide-formoterol INHALE 2 PUFF USING INHALER TWICE A DAY   budesonide-formoterol 160-4.5 MCG/ACT inhaler Commonly known as: SYMBICORT Inhale 2 puffs into the lungs 2 (two) times daily.   calcipotriene 0.005 % cream Commonly known as: DOVONOX Apply 1 application topically 2 (two) times daily.   cyclobenzaprine 10 MG tablet Commonly known as: FLEXERIL Take 10 mg by mouth 3 (three) times daily as needed for muscle spasms.   diazepam 5 MG tablet Commonly known as: VALIUM Take 5 mg by mouth 2 (two) times daily.   diclofenac sodium 1 % Gel Commonly known as: VOLTAREN Apply 2-4 g topically 4 (four) times daily as needed (for pain.).   DULoxetine 20 MG capsule Commonly known as: CYMBALTA Take 2 capsules (40 mg total) by mouth daily.   estradiol 0.1 MG/GM vaginal cream Commonly known as: ESTRACE   fluconazole 150 MG tablet Commonly known as: DIFLUCAN Place 150 mg into the nose See admin instructions. Patient reports 2 sprays in each nostril   fluconazole 100 MG tablet Commonly known as: DIFLUCAN TAKE 1 TABLET BY MOUTH EVERY DAY FOR 7 DAYS   fluticasone 50 MCG/ACT nasal spray Commonly known as: FLONASE Place 1 spray into both nostrils 2 (two) times daily.   furosemide 40 MG tablet Commonly known as: LASIX Take 40 mg by mouth daily.   gabapentin 300 MG capsule Commonly known as: NEURONTIN Take 300 mg by mouth at bedtime.   hydrOXYzine 25 MG tablet Commonly known as: ATARAX/VISTARIL Take 25 mg by mouth 2 (two) times daily as needed for itching.   insulin lispro 100 UNIT/ML injection Commonly known as: HUMALOG Inject 10-20 Units into the skin 3 (three) times daily before meals. Sliding Scale Insulin   HumaLOG KwikPen 100 UNIT/ML KwikPen Generic drug: insulin lispro INJECT 10 20 UNITS 3 TIMES A DAY BEFORE MEALS SUBCUTANEOUSLY   HumaLOG KwikPen 100 UNIT/ML KwikPen Generic  drug: insulin lispro INJECT 10 20 UNITS 3 TIMES A DAY BEFORE MEALS SUBCUTANEOUSLY   isosorbide mononitrate 60 MG 24 hr tablet Commonly known as: IMDUR TAKE 1 TABLET (60 MG TOTAL) BY MOUTH DAILY. PT NEEDS TO KEEP UPCOMING APPT IN FEB, 2021 FOR MORE REFILLS   levocetirizine 5 MG tablet Commonly known as: XYZAL Take 5 mg by mouth daily.   levothyroxine 125 MCG tablet Commonly known as: SYNTHROID Take 125 mcg by mouth daily before breakfast.   levothyroxine 137 MCG tablet Commonly known as: SYNTHROID Take 137 mcg by mouth daily before breakfast.   Levothyroxine Sodium 125 MCG Caps Take by mouth.   lidocaine 5 % ointment Commonly known as: XYLOCAINE   losartan 25 MG tablet Commonly  known as: COZAAR Take 1 tablet (25 mg total) by mouth daily.   Magnesium Oxide 400 (240 Mg) MG Tabs Take 400 mg by mouth 2 (two) times daily.   metoprolol succinate 100 MG 24 hr tablet Commonly known as: TOPROL-XL Take by mouth.   metoprolol succinate 25 MG 24 hr tablet Commonly known as: TOPROL-XL Take 1 tablet (25 mg total) by mouth daily. Please keep upcoming appt in February before anymore refills. Thank you   mirtazapine 30 MG tablet Commonly known as: REMERON Take 1 tablet (30 mg total) by mouth at bedtime.   mupirocin ointment 2 % Commonly known as: BACTROBAN TOPICAL APPLY TO AFFECTED AREA EXTERNALLY TWICE A DAY AS DIRECTED   nitroGLYCERIN 0.4 MG SL tablet Commonly known as: Nitrostat PLACE 1 TABLET (0.4 MG TOTAL) UNDER THE TONGUE EVERY 5 (FIVE) MINUTES AS NEEDED FOR CHEST PAIN.   nitroGLYCERIN 0.4 MG SL tablet Commonly known as: NITROSTAT PLACE 1 TABLET (0.4 MG TOTAL) UNDER THE TONGUE EVERY 5 (FIVE) MINUTES AS NEEDED FOR CHEST PAIN.   ondansetron 4 MG tablet Commonly known as: ZOFRAN Take 4 mg by mouth 3 (three) times daily as needed for nausea or vomiting.   Oxycodone HCl 10 MG Tabs Take 10 mg by mouth 3 (three) times daily.   pantoprazole 40 MG tablet Commonly known as:  PROTONIX Take 1 tablet (40 mg total) by mouth daily. Please make yearly appt with Dr. Tamala Julian for November before anymore refills. 1st attempt   Pneumovax 23 25 MCG/0.5ML injection Generic drug: pneumococcal 23 valent vaccine   potassium chloride SA 20 MEQ tablet Commonly known as: KLOR-CON Take by mouth.   potassium chloride SA 20 MEQ tablet Commonly known as: KLOR-CON Take 20 mEq by mouth daily.   prasugrel 10 MG Tabs tablet Commonly known as: EFFIENT TAKE 1 TABLET BY MOUTH EVERY DAY   Premarin vaginal cream Generic drug: conjugated estrogens   promethazine 25 MG suppository Commonly known as: PHENERGAN UNWRAP AND INSERT 1 SUPPOSITORY RECTALLY EVERY 6 HOURS AS NEEDED FOR NAUSEA   promethazine 25 MG tablet Commonly known as: PHENERGAN Take 25 mg by mouth every 6 (six) hours as needed.   rosuvastatin 40 MG tablet Commonly known as: CRESTOR Take by mouth.   rosuvastatin 40 MG tablet Commonly known as: CRESTOR Take 40 mg by mouth every morning.   tiotropium 18 MCG inhalation capsule Commonly known as: SPIRIVA Place 18 mcg into inhaler and inhale daily.   Tyler Aas FlexTouch 100 UNIT/ML Sopn FlexTouch Pen Generic drug: insulin degludec Inject 50 Units into the skin daily.   Vitamin D (Ergocalciferol) 1.25 MG (50000 UNIT) Caps capsule Commonly known as: DRISDOL Take 50,000 Units by mouth every Monday.   Vitamin D3 1.25 MG (50000 UT) Caps Take 1 capsule by mouth once a week.       Allergies:  Allergies  Allergen Reactions  . Tape Other (See Comments)    Adhesive breaks me out (rash)  . Morphine Itching    Family History: Family History  Problem Relation Age of Onset  . COPD Mother   . Heart disease Father   . Heart attack Father     Social History:  reports that she has never smoked. She has never used smokeless tobacco. She reports that she does not drink alcohol or use drugs.  ROS: All other review of systems were reviewed and are negative except  what is noted above in HPI  Physical Exam: BP 134/73   Pulse 80   Temp (!)  96.1 F (35.6 C)   Ht 5\' 4"  (1.626 m)   Wt 215 lb (97.5 kg)   BMI 36.90 kg/m   Constitutional:  Alert and oriented, No acute distress. HEENT: [place] AT, moist mucus membranes.  Trachea midline, no masses. Cardiovascular: No clubbing, cyanosis, or edema. Respiratory: Normal respiratory effort, no increased work of breathing. GI: Abdomen is soft, nontender, nondistended, no abdominal masses GU: No CVA tenderness Lymph: No cervical or inguinal lymphadenopathy. Skin: No rashes, bruises or suspicious lesions. Neurologic: Grossly intact, no focal deficits, moving all 4 extremities. Psychiatric: Normal mood and affect.  Laboratory Data: Lab Results  Component Value Date   WBC 5.6 12/01/2017   HGB 10.4 (L) 12/01/2017   HCT 34.0 (L) 12/01/2017   MCV 87.6 12/01/2017   PLT 179 12/01/2017    Lab Results  Component Value Date   CREATININE 0.98 12/02/2017    No results found for: PSA  No results found for: TESTOSTERONE  No results found for: HGBA1C  Urinalysis    Component Value Date/Time   BILIRUBINUR neg 03/01/2019 1529   PROTEINUR Negative 03/01/2019 1529   UROBILINOGEN 0.2 03/01/2019 1529   NITRITE neg 03/01/2019 1529   LEUKOCYTESUR Negative 03/01/2019 1529    No results found for: LABMICR, [place], RBCUA, LABEPIT, MUCUS, BACTERIA  Pertinent Imaging:  No results found for this or any previous visit. No results found for this or any previous visit. No results found for this or any previous visit. No results found for this or any previous visit. No results found for this or any previous visit. No results found for this or any previous visit. No results found for this or any previous visit. No results found for this or any previous visit.  Assessment & Plan:    1. Urinary frequency/OAB -Mirabegron 25mg  daily - POCT urinalysis dipstick - BLADDER SCAN AMB NON-IMAGING  2. Chronic  cystitis: -We discussed the natural hx of recurrent UTIs and the various causes. We will start macrobid 50mg  qhs    No follow-ups on file.  Nicolette Bang, MD  [facility] [place]

## 2019-03-02 ENCOUNTER — Ambulatory Visit: Payer: Medicare Other | Admitting: Podiatry

## 2019-03-09 ENCOUNTER — Other Ambulatory Visit: Payer: Self-pay | Admitting: Podiatry

## 2019-03-09 ENCOUNTER — Ambulatory Visit (INDEPENDENT_AMBULATORY_CARE_PROVIDER_SITE_OTHER): Payer: Medicare Other | Admitting: Podiatry

## 2019-03-09 ENCOUNTER — Other Ambulatory Visit: Payer: Self-pay

## 2019-03-09 ENCOUNTER — Encounter: Payer: Self-pay | Admitting: Podiatry

## 2019-03-09 ENCOUNTER — Ambulatory Visit (INDEPENDENT_AMBULATORY_CARE_PROVIDER_SITE_OTHER): Payer: Medicare Other

## 2019-03-09 VITALS — BP 167/93 | HR 81 | Temp 96.4°F

## 2019-03-09 DIAGNOSIS — E1149 Type 2 diabetes mellitus with other diabetic neurological complication: Secondary | ICD-10-CM | POA: Diagnosis not present

## 2019-03-09 DIAGNOSIS — L600 Ingrowing nail: Secondary | ICD-10-CM | POA: Diagnosis not present

## 2019-03-09 DIAGNOSIS — L84 Corns and callosities: Secondary | ICD-10-CM | POA: Diagnosis not present

## 2019-03-09 DIAGNOSIS — E1140 Type 2 diabetes mellitus with diabetic neuropathy, unspecified: Secondary | ICD-10-CM

## 2019-03-09 DIAGNOSIS — M79671 Pain in right foot: Secondary | ICD-10-CM

## 2019-03-09 DIAGNOSIS — M2041 Other hammer toe(s) (acquired), right foot: Secondary | ICD-10-CM

## 2019-03-09 MED ORDER — NEOMYCIN-POLYMYXIN-HC 3.5-10000-1 OT SOLN: OTIC | 0 refills | Status: DC

## 2019-03-09 NOTE — Patient Instructions (Signed)

## 2019-03-10 NOTE — Progress Notes (Signed)
Subjective:   Patient ID: [name], female   DOB: 64 y.o.   MRN: [id number]   HPI Patient presents stating I have got ingrown toenails on my right big toe and my right second toe and states they have been tender and making it hard for me to wear shoe gear comfortably.  Patient states that the pain is around 7 out of 10 and patient states her sugar has been excellent with slight changes on the left and also concerned about the outside of the right fifth toe stating that it has been sore.  Patient does not smoke and likes to be active   Review of Systems  All other systems reviewed and are negative.       Objective:  Physical Exam Vitals and nursing note reviewed.  Constitutional:      Appearance: She is well-developed.  Pulmonary:     Effort: Pulmonary effort is normal.  Musculoskeletal:        General: Normal range of motion.  Skin:    General: Skin is warm.  Neurological:     Mental Status: She is alert.     Neurovascular status found to be intact muscle strength was adequate range of motion within normal limits.  Patient had incurvated right hallux lateral border second digit medial border with slight changes also on the left.  Patient has a lesion on the outside of the right fifth digit and states she does remember a trauma injury where she dropped something and may have something in there.  Patient has good digital perfusion well oriented x3     Assessment:  Ingrown toenail right hallux second toe possible foreign body right fifth digit and ingrown toenails left not as severe     Plan:  H&P reviewed all conditions and I went ahead and anesthetized the hallux second digit and fifth digit right.  Sterile preps applied all toes and using sterile instrumentation remove the lateral border right hallux medial border right second digit exposed the matrix and applied phenol 3 applications 30 seconds followed by alcohol applied to each border.  The right fifth digit I did do a  probe I found her to be a piece of glass which I removed with keratotic lesion and I flushed the area applied sterile dressing and advised this patient on soaks starting 24 hours with strict instructions to let us know of any issues were to occur

## 2019-03-17 ENCOUNTER — Other Ambulatory Visit: Payer: Medicare Other | Admitting: Orthotics

## 2019-03-18 ENCOUNTER — Other Ambulatory Visit: Payer: Self-pay | Admitting: Interventional Cardiology

## 2019-03-20 ENCOUNTER — Ambulatory Visit: Payer: Medicare Other | Admitting: Interventional Cardiology

## 2019-03-29 NOTE — Progress Notes (Deleted)
Psychiatric Initial Adult Assessment   Patient Identification: [name] MRN:  [id number] Date of Evaluation:  03/29/2019 Referral Source: *** Chief Complaint:   Visit Diagnosis: No diagnosis found.  History of Present Illness:   [name] is a 64 y.o. year old female with a history of , who is referred for    Associated Signs/Symptoms: Depression Symptoms:  {DEPRESSION SYMPTOMS:20000} (Hypo) Manic Symptoms:  {BHH MANIC SYMPTOMS:22872} Anxiety Symptoms:  {BHH ANXIETY SYMPTOMS:22873} Psychotic Symptoms:  {BHH PSYCHOTIC SYMPTOMS:22874} PTSD Symptoms: {BHH PTSD SYMPTOMS:22875}  Past Psychiatric History:  Outpatient:  Psychiatry admission:  Previous suicide attempt:  Past trials of medication:  History of violence:   Previous Psychotropic Medications: {YES/NO:21197}  Substance Abuse History in the last 12 months:  {yes no:314532}  Consequences of Substance Abuse: {BHH CONSEQUENCES OF SUBSTANCE ABUSE:22880}  Past Medical History:  Past Medical History:  Diagnosis Date  . Acid reflux   . Anxiety   . Arthritis   . Arthritis   . Asthma   . Cancer, colon ([place])   . Chest pain   . Coronary artery disease    a. LAD PCI in 2005 b. cath in 2014 showed patent pLAD stent c. Cath 06/11/17 severe ISR of overlapping Cypher DES in pLAD s/p cutting ballon PTCA only; mild dLAD dz  . Diabetes mellitus   . Diabetic neuropathy ([place])   . GERD (gastroesophageal reflux disease)   . Heart disease   . History of skin cancer   . Hypercholesteremia   . Hypertension   . Hypothyroidism   . Myocardial infarction ([place])   . Panic attack   . Sleep apnea   . Sleep apnea   . Thyroid disease     Past Surgical History:  Procedure Laterality Date  . ABDOMINAL HYSTERECTOMY    . BACK SURGERY    . CHOLECYSTECTOMY    . CORONARY ANGIOPLASTY WITH STENT PLACEMENT    . CORONARY BALLOON ANGIOPLASTY N/A 06/11/2017   Procedure: CORONARY BALLOON ANGIOPLASTY;  Surgeon: Leonie Man, MD;   Location: [facility];  Service: Cardiovascular;  Laterality: N/A;  LAD  . CORONARY STENT INTERVENTION  09/07/2017   STENT SYNERGY DES 3X24  . CORONARY STENT INTERVENTION N/A 09/07/2017   Procedure: CORONARY STENT INTERVENTION;  Surgeon: Jettie Booze, MD;  Location: [facility];  Service: Cardiovascular;  Laterality: N/A;  . ELBOW SURGERY    . KNEE SURGERY    . LEFT HEART CATH AND CORONARY ANGIOGRAPHY N/A 06/11/2017   Procedure: LEFT HEART CATH AND CORONARY ANGIOGRAPHY;  Surgeon: Leonie Man, MD;  Location: [facility];  Service: Cardiovascular;  Laterality: N/A;  . LEFT HEART CATH AND CORONARY ANGIOGRAPHY N/A 09/07/2017   Procedure: LEFT HEART CATH AND CORONARY ANGIOGRAPHY;  Surgeon: Jettie Booze, MD;  Location: [facility];  Service: Cardiovascular;  Laterality: N/A;  . LEFT HEART CATH AND CORONARY ANGIOGRAPHY N/A 11/26/2017   Procedure: LEFT HEART CATH AND CORONARY ANGIOGRAPHY;  Surgeon: Belva Crome, MD;  Location: [facility];  Service: Cardiovascular;  Laterality: N/A;  . LEFT HEART CATHETERIZATION WITH CORONARY ANGIOGRAM N/A 06/23/2012   Procedure: LEFT HEART CATHETERIZATION WITH CORONARY ANGIOGRAM;  Surgeon: Sinclair Grooms, MD;  Location: [facility];  Service: Cardiovascular;  Laterality: N/A;    Family Psychiatric History: ***  Family History:  Family History  Problem Relation Age of Onset  . COPD Mother   . Heart disease Father   . Heart attack  Father     Social History:   Social History   Socioeconomic History  . Marital status: Divorced    Spouse name: Not on file  . Number of children: Not on file  . Years of education: Not on file  . Highest education level: Not on file  Occupational History  . Not on file  Tobacco Use  . Smoking status: Never Smoker  . Smokeless tobacco: Never Used  Substance and Sexual Activity  . Alcohol use: No    Alcohol/week: 0.0 standard drinks  . Drug use: No  . Sexual activity:  Not on file  Other Topics Concern  . Not on file  Social History Narrative  . Not on file   Social Determinants of Health   Financial Resource Strain:   . Difficulty of Paying Living Expenses: Not on file  Food Insecurity:   . Worried About Charity fundraiser in the Last Year: Not on file  . Ran Out of Food in the Last Year: Not on file  Transportation Needs:   . Lack of Transportation (Medical): Not on file  . Lack of Transportation (Non-Medical): Not on file  Physical Activity:   . Days of Exercise per Week: Not on file  . Minutes of Exercise per Session: Not on file  Stress:   . Feeling of Stress : Not on file  Social Connections:   . Frequency of Communication with Friends and Family: Not on file  . Frequency of Social Gatherings with Friends and Family: Not on file  . Attends Religious Services: Not on file  . Active Member of Clubs or Organizations: Not on file  . Attends Archivist Meetings: Not on file  . Marital Status: Not on file    Additional Social History: ***  Allergies:   Allergies  Allergen Reactions  . Tape Other (See Comments)    Adhesive breaks me out (rash)  . Morphine Itching    Metabolic Disorder Labs: No results found for: HGBA1C, MPG No results found for: PROLACTIN No results found for: CHOL, TRIG, HDL, CHOLHDL, VLDL, LDLCALC Lab Results  Component Value Date   TSH 12.392 (H) 12/02/2017    Therapeutic Level Labs: No results found for: LITHIUM No results found for: CBMZ No results found for: VALPROATE  Current Medications: Current Outpatient Medications  Medication Sig Dispense Refill  . ACCU-CHEK AVIVA PLUS test strip USE TO CHECK BLOOD SUGAR TWO TIMES DAILY    . acyclovir (ZOVIRAX) 800 MG tablet TAKE 1 TABLET BY MOUTH FIVE TIMES A DAY    . albuterol (PROVENTIL) (2.5 MG/3ML) 0.083% nebulizer solution     . aspirin EC 81 MG tablet Take 81 mg by mouth daily.    . B-D ULTRAFINE III SHORT PEN 31G X 8 MM MISC See admin  instructions.    . budesonide-formoterol (SYMBICORT) 160-4.5 MCG/ACT inhaler Inhale 2 puffs into the lungs 2 (two) times daily.    . calcipotriene (DOVONOX) 0.005 % cream Apply 1 application topically 2 (two) times daily.    . Cholecalciferol (VITAMIN D3) 1.25 MG (50000 UT) CAPS Take 1 capsule by mouth once a week.    . cyclobenzaprine (FLEXERIL) 10 MG tablet Take 10 mg by mouth 3 (three) times daily as needed for muscle spasms.    . diazepam (VALIUM) 5 MG tablet Take 5 mg by mouth 2 (two) times daily.     . diclofenac sodium (VOLTAREN) 1 % GEL Apply 2-4 g topically 4 (four) times daily as needed (  for pain.).   0  . DULoxetine (CYMBALTA) 20 MG capsule Take 2 capsules (40 mg total) by mouth daily. 60 capsule 0  . estradiol (ESTRACE) 0.1 MG/GM vaginal cream     . fluticasone (FLONASE) 50 MCG/ACT nasal spray Place 1 spray into both nostrils 2 (two) times daily.    . furosemide (LASIX) 40 MG tablet Take 40 mg by mouth daily.     Marland Kitchen HUMALOG KWIKPEN 100 UNIT/ML KwikPen INJECT 10 20 UNITS 3 TIMES A DAY BEFORE MEALS SUBCUTANEOUSLY    . hydrOXYzine (ATARAX/VISTARIL) 25 MG tablet Take 25 mg by mouth 2 (two) times daily as needed for itching.   5  . insulin degludec (TRESIBA FLEXTOUCH) 100 UNIT/ML SOPN FlexTouch Pen Inject 50 Units into the skin daily.    . Insulin Pen Needle (B-D ULTRAFINE III SHORT PEN) 31G X 8 MM MISC Use as directed.    Marland Kitchen levothyroxine (SYNTHROID, LEVOTHROID) 125 MCG tablet Take 150 mcg by mouth daily before breakfast.     . lidocaine (XYLOCAINE) 5 % ointment     . losartan (COZAAR) 25 MG tablet Take 1 tablet (25 mg total) by mouth daily. 90 tablet 2  . Magnesium Oxide 400 (240 Mg) MG TABS Take 400 mg by mouth 2 (two) times daily.  5  . mirtazapine (REMERON) 30 MG tablet Take 1 tablet (30 mg total) by mouth at bedtime. 30 tablet 0  . mupirocin ointment (BACTROBAN) 2 % TOPICAL APPLY TO AFFECTED AREA EXTERNALLY TWICE A DAY AS DIRECTED    . neomycin-polymyxin-hydrocortisone (CORTISPORIN)  OTIC solution Apply 1-2 drops to toe after soaking twice a day 10 mL 0  . nitroGLYCERIN (NITROSTAT) 0.4 MG SL tablet PLACE 1 TABLET (0.4 MG TOTAL) UNDER THE TONGUE EVERY 5 (FIVE) MINUTES AS NEEDED FOR CHEST PAIN. 25 tablet 3  . nitroGLYCERIN (NITROSTAT) 0.4 MG SL tablet PLACE 1 TABLET (0.4 MG TOTAL) UNDER THE TONGUE EVERY 5 (FIVE) MINUTES AS NEEDED FOR CHEST PAIN.    Marland Kitchen Oxycodone HCl 10 MG TABS Take 10 mg by mouth 3 (three) times daily.    . pantoprazole (PROTONIX) 40 MG tablet Take 1 tablet (40 mg total) by mouth daily. Please make yearly appt with Dr. Tamala Julian for November before anymore refills. 1st attempt 90 tablet 0  . potassium chloride SA (K-DUR,KLOR-CON) 20 MEQ tablet Take 20 mEq by mouth daily.     . prasugrel (EFFIENT) 10 MG TABS tablet TAKE 1 TABLET BY MOUTH EVERY DAY 90 tablet 3  . promethazine (PHENERGAN) 25 MG tablet Take 25 mg by mouth every 6 (six) hours as needed.    . rosuvastatin (CRESTOR) 40 MG tablet Take by mouth.    . tiotropium (SPIRIVA) 18 MCG inhalation capsule Place 18 mcg into inhaler and inhale daily.     . Vitamin D, Ergocalciferol, (DRISDOL) 50000 units CAPS capsule Take 50,000 Units by mouth every Monday.     No current facility-administered medications for this visit.    Musculoskeletal: Strength & Muscle Tone: N/A Gait & Station: N/A Patient leans: N/A  Psychiatric Specialty Exam: Review of Systems  There were no vitals taken for this visit.There is no height or weight on file to calculate BMI.  General Appearance: {Appearance:22683}  Eye Contact:  {BHH EYE CONTACT:22684}  Speech:  Clear and Coherent  Volume:  Normal  Mood:  {BHH MOOD:22306}  Affect:  {Affect (PAA):22687}  Thought Process:  Coherent  Orientation:  Full (Time, Place, and Person)  Thought Content:  Logical  Suicidal Thoughts:  {  ST/HT (PAA):22692}  Homicidal Thoughts:  {ST/HT (PAA):22692}  Memory:  Immediate;   Good  Judgement:  {Judgement (PAA):22694}  Insight:  {Insight (PAA):22695}   Psychomotor Activity:  Normal  Concentration:  Concentration: Good and Attention Span: Good  Recall:  Good  Fund of Knowledge:Good  Language: Good  Akathisia:  No  Handed:  Right  AIMS (if indicated):  not done  Assets:  Communication Skills Desire for Improvement  ADL's:  Intact  Cognition: WNL  Sleep:  {BHH GOOD/FAIR/POOR:22877}   Screenings:   Assessment and Plan:  [name] is a 64 y.o. year old female with a history of depression, CAD, hypertension, hyperlipidemia, diabetes, asthma, severe OSA, hypothyroidism by history , who presents for follow up appointment for No diagnosis found.  # MDD, moderate, recurrent without psychotic features # r/o PTSD   Patient endorses neurovegetative symptoms, which has worsened over the past few months after witnessing the death of her significant other.  Other psychosocial stressors including medical condition, and loneliness.  Will switch from Paxil to duloxetine to target depression.  Discussed potential risk of serotonin syndrome, and hypertension.  Will continue mirtazapine at this time as adjunctive treatment for depression.  Will consider tapering it off in the future if no significant benefit from this medication.  Although she may benefit from prazosin in the future for nightmares, will hold this at this time given she is on antihypertensive medication.  Discussed behavioral activation.  She will greatly benefit from CBT; will make a referral.   Plan 1. Decrease Paxil 10 mg daily for one week, then discontinue  2. Start duloxetine 20 mg daily for one week, then 40 mg daily  3. Continue mirtazapine 30 mg at night  4. Return to clinic in one month for 30 mins 5. Referral to therapy  - she is on valium prescribed by her PCP - she receives treatment for hypothyroidism from endocrinologist in [facility]  The patient demonstrates the following risk factors for suicide: Chronic risk factors for suicide include: {Chronic Risk  Factors for AS:6451928. Acute risk factors for suicide include: {Acute Risk Factors for SW:8008971. Protective factors for this patient include: {Protective Factors for Suicide CJ:814540. Considering these factors, the overall suicide risk at this point appears to be {Desc; low/moderate/high:110033}. Patient {ACTION; IS/IS VG:[id number] appropriate for outpatient follow up.     Norman Clay, MD 2/24/202110:28 AM

## 2019-04-03 ENCOUNTER — Ambulatory Visit: Payer: Medicare Other | Admitting: Podiatry

## 2019-04-03 ENCOUNTER — Ambulatory Visit (HOSPITAL_COMMUNITY): Payer: Medicare Other | Admitting: Psychiatry

## 2019-04-03 ENCOUNTER — Telehealth (HOSPITAL_COMMUNITY): Payer: Self-pay | Admitting: Psychiatry

## 2019-04-03 ENCOUNTER — Other Ambulatory Visit: Payer: Self-pay

## 2019-04-03 NOTE — Telephone Encounter (Signed)
Called the patient on the phone as she did not sign in doxyme. She states that she did not make this appointment, and does not need to be seen.

## 2019-04-12 ENCOUNTER — Ambulatory Visit: Payer: Medicare Other | Admitting: Urology

## 2019-04-14 ENCOUNTER — Other Ambulatory Visit: Payer: Self-pay | Admitting: Neurology

## 2019-04-14 ENCOUNTER — Other Ambulatory Visit (HOSPITAL_COMMUNITY): Payer: Self-pay | Admitting: Neurology

## 2019-04-14 DIAGNOSIS — M545 Low back pain, unspecified: Secondary | ICD-10-CM

## 2019-04-14 DIAGNOSIS — M546 Pain in thoracic spine: Secondary | ICD-10-CM

## 2019-04-17 ENCOUNTER — Other Ambulatory Visit: Payer: Self-pay | Admitting: Neurology

## 2019-04-17 DIAGNOSIS — M5412 Radiculopathy, cervical region: Secondary | ICD-10-CM

## 2019-04-19 ENCOUNTER — Other Ambulatory Visit: Payer: Self-pay | Admitting: Podiatry

## 2019-05-18 ENCOUNTER — Other Ambulatory Visit: Payer: Self-pay | Admitting: Oncology

## 2019-05-19 ENCOUNTER — Other Ambulatory Visit: Payer: Self-pay | Admitting: Adult Medicine

## 2019-05-19 ENCOUNTER — Other Ambulatory Visit: Payer: Self-pay | Admitting: Nurse Practitioner

## 2019-05-19 DIAGNOSIS — R209 Unspecified disturbances of skin sensation: Secondary | ICD-10-CM

## 2019-05-26 ENCOUNTER — Other Ambulatory Visit: Payer: Self-pay | Admitting: Nurse Practitioner

## 2019-05-29 ENCOUNTER — Telehealth: Payer: Self-pay | Admitting: Interventional Cardiology

## 2019-05-29 NOTE — Telephone Encounter (Signed)
Pt c/o of Chest Pain: STAT if CP now or developed within 24 hours  1. Are you having CP right now?  No. Pt is sitting while on the phone. Chest pain will start if she gets up and starts moving  2. Are you experiencing any other symptoms (ex. SOB, nausea, vomiting, sweating)? High BP  3. How long have you been experiencing CP? 3 weeks  4. Is your CP continuous or coming and going? Comes and goes   5. Have you taken Nitroglycerin? Yes  ?  Patient just wanted to come in and get an EKG

## 2019-05-29 NOTE — Telephone Encounter (Signed)
Pt is complaining of severe chest pain on exertion. She has been experiencing this on and off for the past three weeks but it has gotten progressively worse. The patients most recent BP is 180/100. She reports having to take several breaks throughout the day to rest d/t her chest hurting. She has taken Nitroglycerin multiple times with mininmal relief. I advised the patient to call EMS as she is currently alone and does not have anyone who can take her to the ED. Pt verbalized understanding.

## 2019-05-29 NOTE — Telephone Encounter (Signed)
Does not appear that pt presented to the ER yet, unless she went to [facility] in [place].  Called pt and left message to call back. I wanted to check in with pt and schedule her to see our PA tomorrow if pt was willing.

## 2019-05-30 NOTE — Telephone Encounter (Signed)
Attempted to contact pt again to see how she was doing and get her an appt.  Left message to call back.

## 2019-05-31 ENCOUNTER — Ambulatory Visit: Payer: Medicare Other | Admitting: Urology

## 2019-05-31 NOTE — H&P (Signed)
Surgical History & Physical  Patient Name: [name] DOB: 05/13/1955  Surgery: Cataract extraction with intraocular lens implant phacoemulsification; Right Eye  Surgeon: Baruch Goldmann MD Surgery Date:  06/09/2019 Pre-Op Date:  05/31/2019  HPI: A 70 Yr. old female patient 1. 1. The patient complains of difficulty when viewing TV, reading closed caption, news scrolls on TV, which began 1 year ago. Both eyes are affected. The episode is gradual. The patient describes hazy symptoms affecting their eyes/vision. Pt is needing more light to read. Glare with lights at night. This is negatively affecting the patient's quality of life. No eye discomfort. Last A1C was 6.0. -referred by Dr Hassell Done for cataract eval. HPI was performed by Baruch Goldmann .  Medical History: Cataracts Macula Degeneration Depression Diabetes High Blood Pressure LDL Lung Problems Thyroid Problems  Review of Systems Negative Allergic/Immunologic Negative Cardiovascular Negative Constitutional Negative Ear, Nose, Mouth & Throat Negative Endocrine Negative Eyes Negative Gastrointestinal Negative Genitourinary Negative Hemotologic/Lymphatic Negative Integumentary Negative Musculoskeletal Negative Neurological Negative Psychiatry Negative Respiratory  Social   Former smoker   Medication Acyclovir, Aspirin, Cyclobenzaprine, Vitamin D2, Furosemide, Gabapentin, Humalog, Insulin deglerdec, Levothyroxine Sodium, Lidocaine oint., Losartan Potassium, Albuterol, Symbicort Inhalant Product, Metoprolol, Oxycodone, Potassium chloride, Prasugel, Premarin, Rosuvastatin, Sertraline, Tiatropium,   Sx/Procedures Hysterectomy, Gallbladder Sx, Heart stent, Colon cancer, Hernia, Elbow,   Drug Allergies   NKDA  History & Physical: Heent:  Cataract, Right eye NECK: supple without bruits LUNGS: lungs clear to auscultation CV: regular rate and rhythm Abdomen: soft and non-tender  Impression & Plan: Assessment: 1.   COMBINED FORMS AGE RELATED CATARACT; Right Eye (H25.811)  Plan: 1.  Cataract accounts for the patient's decreased vision. This visual impairment is not correctable with a tolerable change in glasses or contact lenses. Cataract surgery with an implantation of a new lens should significantly improve the visual and functional status of the patient. Discussed all risks, benefits, alternatives, and potential complications. Discussed the procedures and recovery. Patient desires to have surgery. A-scan ordered and performed today for intra-ocular lens calculations. The surgery will be performed in order to improve vision for driving, reading, and for eye examinations. Recommend phacoemulsification with intra-ocular lens. Right Eye worse - first. Dilates well - shugarcaine by protocol.

## 2019-06-02 NOTE — Patient Instructions (Signed)
Your procedure is scheduled on:  06/09/2019               Report to [facility] at  6:45   AM.  Call this number if you have problems the morning of surgery: [phone]   Do not eat or drink :After Midnight.    Take these medicines the morning of surgery with A SIP OF WATER:  Effient, losartan, protonix, cymbalta, Spiriva, Flexeril, valium              and flonase.  Use inhalers            No diabetic medication mornig of procedure          Do not wear jewelry, make-up or nail polish.  Do not wear lotions, powders, or perfumes. You may wear deodorant.  Do not bring valuables to the hospital.  Contacts, dentures or bridgework may not be worn into surgery.  Patients discharged the day of surgery will not be allowed to drive home.  Name and phone number of your driver.                                                                                                                                       Cataract Surgery  A cataract is a clouding of the lens of the eye. When a lens becomes cloudy, vision is reduced based on the degree and nature of the clouding. Surgery may be needed to improve vision. Surgery removes the cloudy lens and usually replaces it with a substitute lens (intraocular lens, IOL). LET YOUR EYE DOCTOR KNOW ABOUT:  Allergies to food or medicine.   Medicines taken including herbs, eyedrops, over-the-counter medicines, and creams.   Use of steroids (by mouth or creams).   Previous problems with anesthetics or numbing medicine.   History of bleeding problems or blood clots.   Previous surgery.   Other health problems, including diabetes and kidney problems.   Possibility of pregnancy, if this applies.  RISKS AND COMPLICATIONS  Infection.   Inflammation of the eyeball (endophthalmitis) that can spread to both eyes (sympathetic ophthalmia).   Poor wound healing.   If an IOL is inserted, it can later fall out of proper position. This is very uncommon.    Clouding of the part of your eye that holds an IOL in place. This is called an "after-cataract." These are uncommon, but easily treated.  BEFORE THE PROCEDURE  Do not eat or drink anything except small amounts of water for 8 to 12 before your surgery, or as directed by your caregiver.    Unless you are told otherwise, continue any eyedrops you have been prescribed.   Talk to your primary caregiver about all other medicines that you take (both prescription and non-prescription). In some cases, you may need to stop or change medicines near the time of your surgery. This is most important if you are  taking blood-thinning medicine. Do not stop medicines unless you are told to do so.   Arrange for someone to drive you to and from the procedure.   Do not put contact lenses in either eye on the day of your surgery.  PROCEDURE There is more than one method for safely removing a cataract. Your doctor can explain the differences and help determine which is best for you. Phacoemulsification surgery is the most common form of cataract surgery.  An injection is given behind the eye or eyedrops are given to make this a painless procedure.   A small cut (incision) is made on the edge of the clear, dome-shaped surface that covers the front of the eye (cornea).   A tiny probe is painlessly inserted into the eye. This device gives off ultrasound waves that soften and break up the cloudy center of the lens. This makes it easier for the cloudy lens to be removed by suction.   An IOL may be implanted.   The normal lens of the eye is covered by a clear capsule. Part of that capsule is intentionally left in the eye to support the IOL.   Your surgeon may or may not use stitches to close the incision.  There are other forms of cataract surgery that require a larger incision and stiches to close the eye. This approach is taken in cases where the doctor feels that the cataract cannot be easily removed using  phacoemulsification. AFTER THE PROCEDURE  When an IOL is implanted, it does not need care. It becomes a permanent part of your eye and cannot be seen or felt.   Your doctor will schedule follow-up exams to check on your progress.   Review your other medicines with your doctor to see which can be resumed after surgery.   Use eyedrops or take medicine as prescribed by your doctor.  Document Released: 01/08/2011 Document Reviewed: 01/05/2011 [facility] Patient Information 2012 [place].  .Cataract Surgery Care After Refer to this sheet in the next few weeks. These instructions provide you with information on caring for yourself after your procedure. Your caregiver may also give you more specific instructions. Your treatment has been planned according to current medical practices, but problems sometimes occur. Call your caregiver if you have any problems or questions after your procedure.  HOME CARE INSTRUCTIONS   Avoid strenuous activities as directed by your caregiver.   Ask your caregiver when you can resume driving.   Use eyedrops or other medicines to help healing and control pressure inside your eye as directed by your caregiver.   Only take over-the-counter or prescription medicines for pain, discomfort, or fever as directed by your caregiver.   Do not to touch or rub your eyes.   You may be instructed to use a protective shield during the first few days and nights after surgery. If not, wear sunglasses to protect your eyes. This is to protect the eye from pressure or from being accidentally bumped.   Keep the area around your eye clean and dry. Avoid swimming or allowing water to hit you directly in the face while showering. Keep soap and shampoo out of your eyes.   Do not bend or lift heavy objects. Bending increases pressure in the eye. You can walk, climb stairs, and do light household chores.   Do not put a contact lens into the eye that had surgery until your caregiver  says it is okay to do so.   Ask your doctor when  you can return to work. This will depend on the kind of work that you do. If you work in a dusty environment, you may be advised to wear protective eyewear for a period of time.   Ask your caregiver when it will be safe to engage in sexual activity.   Continue with your regular eye exams as directed by your caregiver.  What to expect:  It is normal to feel itching and mild discomfort for a few days after cataract surgery. Some fluid discharge is also common, and your eye may be sensitive to light and touch.   After 1 to 2 days, even moderate discomfort should disappear. In most cases, healing will take about 6 weeks.   If you received an intraocular lens (IOL), you may notice that colors are very bright or have a blue tinge. Also, if you have been in bright sunlight, everything may appear reddish for a few hours. If you see these color tinges, it is because your lens is clear and no longer cloudy. Within a few months after receiving an IOL, these extra colors should go away. When you have healed, you will probably need new glasses.  SEEK MEDICAL CARE IF:   You have increased bruising around your eye.   You have discomfort not helped by medicine.  SEEK IMMEDIATE MEDICAL CARE IF:   You have a  fever.   You have a worsening or sudden vision loss.   You have redness, swelling, or increasing pain in the eye.   You have a thick discharge from the eye that had surgery.  MAKE SURE YOU:  Understand these instructions.   Will watch your condition.   Will get help right away if you are not doing well or get worse.  Document Released: 08/08/2004 Document Revised: 01/08/2011 Document Reviewed: 09/12/2010 [facility] Patient Information 2012 [place].    Monitored Anesthesia Care  Monitored anesthesia care is an anesthesia service for a medical procedure. Anesthesia is the loss of the ability to feel pain. It is produced by medications  called anesthetics. It may affect a small area of your body (local anesthesia), a large area of your body (regional anesthesia), or your entire body (general anesthesia). The need for monitored anesthesia care depends your procedure, your condition, and the potential need for regional or general anesthesia. It is often provided during procedures where:   General anesthesia may be needed if there are complications. This is because you need special care when you are under general anesthesia.    You will be under local or regional anesthesia. This is so that you are able to have higher levels of anesthesia if needed.    You will receive calming medications (sedatives). This is especially the case if sedatives are given to put you in a semi-conscious state of relaxation (deep sedation). This is because the amount of sedative needed to produce this state can be hard to predict. Too much of a sedative can produce general anesthesia. Monitored anesthesia care is performed by one or more caregivers who have special training in all types of anesthesia. You will need to meet with these caregivers before your procedure. During this meeting, they will ask you about your medical history. They will also give you instructions to follow. (For example, you will need to stop eating and drinking before your procedure. You may also need to stop or change medications you are taking.) During your procedure, your caregivers will stay with you. They will:   Watch your  condition. This includes watching you blood pressure, breathing, and level of pain.    Diagnose and treat problems that occur.    Give medications if they are needed. These may include calming medications (sedatives) and anesthetics.    Make sure you are comfortable.   Having monitored anesthesia care does not necessarily mean that you will be under anesthesia. It does mean that your caregivers will be able to manage anesthesia if you need it or if it occurs.  It also means that you will be able to have a different type of anesthesia than you are having if you need it. When your procedure is complete, your caregivers will continue to watch your condition. They will make sure any medications wear off before you are allowed to go home.  Document Released: 10/15/2004 Document Revised: 05/16/2012 Document Reviewed: 03/02/2012 [facility] Patient Information 2014 [place], [place].

## 2019-06-06 ENCOUNTER — Other Ambulatory Visit (HOSPITAL_COMMUNITY)
Admission: RE | Admit: 2019-06-06 | Discharge: 2019-06-06 | Disposition: A | Discharge status: Home / Self Care | Payer: Medicare Other | Source: Ambulatory Visit | Attending: Ophthalmology | Admitting: Ophthalmology

## 2019-06-06 ENCOUNTER — Other Ambulatory Visit: Payer: Self-pay

## 2019-06-06 ENCOUNTER — Encounter (HOSPITAL_COMMUNITY): Payer: Self-pay

## 2019-06-06 ENCOUNTER — Encounter (HOSPITAL_COMMUNITY)
Admission: RE | Admit: 2019-06-06 | Discharge: 2019-06-06 | Disposition: A | Discharge status: Home / Self Care | Payer: Medicare Other | Source: Ambulatory Visit | Attending: Ophthalmology | Admitting: Ophthalmology

## 2019-06-26 ENCOUNTER — Other Ambulatory Visit (HOSPITAL_COMMUNITY): Payer: Medicare Other

## 2019-06-27 NOTE — H&P (Signed)
Surgical History & Physical  Patient Name: [name] DOB: [birth year]  Surgery: Cataract extraction with intraocular lens implant phacoemulsification; Right Eye  Surgeon: Baruch Goldmann MD Surgery Date:  07/17/2019 Pre-Op Date:  06/27/2019  HPI: A 35 Yr. old female patient 1. 1. The patient complains of difficulty when viewing TV, reading closed caption, news scrolls on TV, which began 1 year ago. Both eyes are affected. The episode is gradual. The patient describes hazy symptoms affecting their eyes/vision. Pt is needing more light to read. Glare with lights at night. This is negatively affecting the patient's quality of life. No eye discomfort. Last A1C was 6.0. -referred by Dr Hassell Done for cataract eval. HPI was performed by Baruch Goldmann .  Medical History: Cataracts Macula Degeneration Depression Diabetes High Blood Pressure LDL Lung Problems Thyroid Problems  Review of Systems Negative Allergic/Immunologic Negative Cardiovascular Negative Constitutional Negative Ear, Nose, Mouth & Throat Negative Endocrine Negative Eyes Negative Gastrointestinal Negative Genitourinary Negative Hemotologic/Lymphatic Negative Integumentary Negative Musculoskeletal Negative Neurological Negative Psychiatry Negative Respiratory  Social   Former smoker   Medication Acyclovir, Aspirin, Cyclobenzaprine, Vitamin D2, Furosemide, Gabapentin, Humalog, Insulin deglerdec, Levothyroxine Sodium, Lidocaine oint., Losartan Potassium, Albuterol, Symbicort Inhalant Product, Metoprolol, Oxycodone, Potassium chloride, Prasugel, Premarin, Rosuvastatin, Sertraline, Tiatropium,   Sx/Procedures Hysterectomy, Gallbladder Sx, Heart stent, Colon cancer, Hernia, Elbow,   Drug Allergies   NKDA  History & Physical: Heent:  Cataract, Right eye NECK: supple without bruits LUNGS: lungs clear to auscultation CV: regular rate and rhythm Abdomen: soft and non-tender  Impression & Plan: Assessment: 1.   COMBINED FORMS AGE RELATED CATARACT; Right Eye (H25.811)  Plan: 1.  Cataract accounts for the patient's decreased vision. This visual impairment is not correctable with a tolerable change in glasses or contact lenses. Cataract surgery with an implantation of a new lens should significantly improve the visual and functional status of the patient. Discussed all risks, benefits, alternatives, and potential complications. Discussed the procedures and recovery. Patient desires to have surgery. A-scan ordered and performed today for intra-ocular lens calculations. The surgery will be performed in order to improve vision for driving, reading, and for eye examinations. Recommend phacoemulsification with intra-ocular lens. Right Eye worse - first. Dilates well - shugarcaine by protocol.

## 2019-07-11 NOTE — Patient Instructions (Signed)
[name]  07/11/2019     @PREFPERIOPPHARMACY @   Your procedure is scheduled on  07/17/2019 .  Report [facility] at  0700  A.M.  Call this number if you have problems the morning of surgery:  [phone]   Remember:  Do not eat or drink after midnight.                       Take these medicines the morning of surgery with A SIP OF WATER  Flexeril(if needed), valium(if needed), cymbalta, levothyroxine, hydroxyzine(if needed), oxycodone(if needed), protonix, effient, phenergan (if needed). Take 1/2 night time insulin(25 units). Use your nebulizer and your inhalers before you come. DO NOT take any medications for diabetes the morning of your procedure.    Do not wear jewelry, make-up or nail polish.  Do not wear lotions, powders, or perfumes. Please wear deodorant and brush your teeth.  Do not shave 48 hours prior to surgery.  Men may shave face and neck.  Do not bring valuables to the hospital.  [facility] is not responsible for any belongings or valuables.  Contacts, dentures or bridgework may not be worn into surgery.  Leave your suitcase in the car.  After surgery it may be brought to your room.  For patients admitted to the hospital, discharge time will be determined by your treatment team.  Patients discharged the day of surgery will not be allowed to drive home.   Name and phone number of your driver:   family Special instructions:  DO NOT smoke the morning of your procedure.  Please read over the following fact sheets that you were given. Anesthesia Post-op Instructions and Care and Recovery After Surgery       Cataract Surgery, Care After This sheet gives you information about how to care for yourself after your procedure. Your health care provider may also give you more specific instructions. If you have problems or questions, contact your health care provider. What can I expect after the procedure? After the procedure, it is common to  have:  Itching.  Discomfort.  Fluid discharge.  Sensitivity to light and to touch.  Bruising in or around the eye.  Mild blurred vision. Follow these instructions at home: Eye care   Do not touch or rub your eyes.  Protect your eyes as told by your health care provider. You may be told to wear a protective eye shield or sunglasses.  Do not put a contact lens into the affected eye or eyes until your health care provider approves.  Keep the area around your eye clean and dry: ? Avoid swimming. ? Do not allow water to hit you directly in the face while showering. ? Keep soap and shampoo out of your eyes.  Check your eye every day for signs of infection. Watch for: ? Redness, swelling, or pain. ? Fluid, blood, or pus. ? Warmth. ? A bad smell. ? Vision that is getting worse. ? Sensitivity that is getting worse. Activity  Do not drive for 24 hours if you were given a sedative during your procedure.  Avoid strenuous activities, such as playing contact sports, for as long as told by your health care provider.  Do not drive or use heavy machinery until your health care provider approves.  Do not bend or lift heavy objects. Bending increases pressure in the eye. You can walk, climb stairs, and do light household chores.  Ask your health  care provider when you can return to work. If you work in a dusty environment, you may be advised to wear protective eyewear for a period of time. General instructions  Take or apply over-the-counter and prescription medicines only as told by your health care provider. This includes eye drops.  Keep all follow-up visits as told by your health care provider. This is important. Contact a health care provider if:  You have increased bruising around your eye.  You have pain that is not helped with medicine.  You have a fever.  You have redness, swelling, or pain in your eye.  You have fluid, blood, or pus coming from your  incision.  Your vision gets worse.  Your sensitivity to light gets worse. Get help right away if:  You have sudden loss of vision.  You see flashes of light or spots (floaters).  You have severe eye pain.  You develop nausea or vomiting. Summary  After your procedure, it is common to have itching, discomfort, bruising, fluid discharge, or sensitivity to light.  Follow instructions from your health care provider about caring for your eye after the procedure.  Do not rub your eye after the procedure. You may need to wear eye protection or sunglasses. Do not wear contact lenses. Keep the area around your eye clean and dry.  Avoid activities that require a lot of effort. These include playing sports and lifting heavy objects.  Contact a health care provider if you have increased bruising, pain that does not go away, or a fever. Get help right away if you suddenly lose your vision, see flashes of light or spots, or have severe pain in the eye. This information is not intended to replace advice given to you by your health care provider. Make sure you discuss any questions you have with your health care provider. Document Revised: 11/15/2018 Document Reviewed: 07/19/2017 Elsevier Patient Education  2020 [place] After These instructions provide you with information about caring for yourself after your procedure. Your health care provider may also give you more specific instructions. Your treatment has been planned according to current medical practices, but problems sometimes occur. Call your health care provider if you have any problems or questions after your procedure. What can I expect after the procedure? After your procedure, you may:  Feel sleepy for several hours.  Feel clumsy and have poor balance for several hours.  Feel forgetful about what happened after the procedure.  Have poor judgment for several hours.  Feel nauseous or  vomit.  Have a sore throat if you had a breathing tube during the procedure. Follow these instructions at home: For at least 24 hours after the procedure:      Have a responsible adult stay with you. It is important to have someone help care for you until you are awake and alert.  Rest as needed.  Do not: ? Participate in activities in which you could fall or become injured. ? Drive. ? Use heavy machinery. ? Drink alcohol. ? Take sleeping pills or medicines that cause drowsiness. ? Make important decisions or sign legal documents. ? Take care of children on your own. Eating and drinking  Follow the diet that is recommended by your health care provider.  If you vomit, drink water, juice, or soup when you can drink without vomiting.  Make sure you have little or no nausea before eating solid foods. General instructions  Take over-the-counter and prescription medicines only as told  by your health care provider.  If you have sleep apnea, surgery and certain medicines can increase your risk for breathing problems. Follow instructions from your health care provider about wearing your sleep device: ? Anytime you are sleeping, including during daytime naps. ? While taking prescription pain medicines, sleeping medicines, or medicines that make you drowsy.  If you smoke, do not smoke without supervision.  Keep all follow-up visits as told by your health care provider. This is important. Contact a health care provider if:  You keep feeling nauseous or you keep vomiting.  You feel light-headed.  You develop a rash.  You have a fever. Get help right away if:  You have trouble breathing. Summary  For several hours after your procedure, you may feel sleepy and have poor judgment.  Have a responsible adult stay with you for at least 24 hours or until you are awake and alert. This information is not intended to replace advice given to you by your health care provider. Make sure  you discuss any questions you have with your health care provider. Document Revised: 04/19/2017 Document Reviewed: 05/12/2015 Elsevier Patient Education  [place].

## 2019-07-13 ENCOUNTER — Other Ambulatory Visit: Payer: Self-pay

## 2019-07-13 ENCOUNTER — Other Ambulatory Visit (HOSPITAL_COMMUNITY)
Admission: RE | Admit: 2019-07-13 | Discharge: 2019-07-13 | Disposition: A | Discharge status: Home / Self Care | Payer: Medicare Other | Source: Ambulatory Visit | Attending: Ophthalmology | Admitting: Ophthalmology

## 2019-07-13 ENCOUNTER — Encounter (HOSPITAL_COMMUNITY): Payer: Self-pay

## 2019-07-13 ENCOUNTER — Encounter (HOSPITAL_COMMUNITY)
Admission: RE | Admit: 2019-07-13 | Discharge: 2019-07-13 | Disposition: A | Discharge status: Home / Self Care | Payer: Medicare Other | Source: Ambulatory Visit | Attending: Ophthalmology | Admitting: Ophthalmology

## 2019-07-13 DIAGNOSIS — Z20822 Contact with and (suspected) exposure to covid-19: Secondary | ICD-10-CM | POA: Diagnosis not present

## 2019-07-13 DIAGNOSIS — Z01818 Encounter for other preprocedural examination: Secondary | ICD-10-CM | POA: Insufficient documentation

## 2019-07-13 HISTORY — DX: Angina pectoris, unspecified: I20.9

## 2019-07-13 HISTORY — DX: Other specified postprocedural states: Z98.890

## 2019-07-13 HISTORY — DX: Nausea with vomiting, unspecified: R11.2

## 2019-07-13 LAB — BASIC METABOLIC PANEL
Anion gap: 15 (ref 5–15)
BUN: 27 mg/dL — ABNORMAL HIGH (ref 8–23)
CO2: 24 mmol/L (ref 22–32)
Calcium: 8 mg/dL — ABNORMAL LOW (ref 8.9–10.3)
Chloride: 96 mmol/L — ABNORMAL LOW (ref 98–111)
Creatinine, Ser: 1.26 mg/dL — ABNORMAL HIGH (ref 0.44–1.00)
GFR calc Af Amer: 52 mL/min — ABNORMAL LOW (ref >60)
GFR calc non Af Amer: 45 mL/min — ABNORMAL LOW (ref >60)
Glucose, Bld: 389 mg/dL — ABNORMAL HIGH (ref 70–99)
Potassium: 4.3 mmol/L (ref 3.5–5.1)
Sodium: 135 mmol/L (ref 135–145)

## 2019-07-13 LAB — HEMOGLOBIN A1C
Hgb A1c MFr Bld: 8.6 % — ABNORMAL HIGH (ref 4.8–5.6)
Mean Plasma Glucose: 200.12 mg/dL

## 2019-07-13 LAB — SARS CORONAVIRUS 2 (TAT 6-24 HRS): SARS Coronavirus 2: NEGATIVE

## 2019-07-13 NOTE — Pre-Procedure Instructions (Signed)
Reviewed extensive cardiac history with Dr Charna Elizabeth, he is okay to proceed with procedure.

## 2019-07-17 ENCOUNTER — Ambulatory Visit (HOSPITAL_COMMUNITY)
Admission: RE | Admit: 2019-07-17 | Discharge: 2019-07-17 | Disposition: A | Discharge status: Home / Self Care | Payer: Medicare Other | Attending: Ophthalmology | Admitting: Ophthalmology

## 2019-07-17 ENCOUNTER — Ambulatory Visit (HOSPITAL_COMMUNITY): Payer: Medicare Other | Admitting: Anesthesiology

## 2019-07-17 ENCOUNTER — Encounter (HOSPITAL_COMMUNITY): Payer: Self-pay | Admitting: Ophthalmology

## 2019-07-17 ENCOUNTER — Encounter (HOSPITAL_COMMUNITY)
Admission: RE | Disposition: A | Discharge status: Home / Self Care | Payer: Self-pay | Source: Home / Self Care | Attending: Ophthalmology

## 2019-07-17 DIAGNOSIS — I2510 Atherosclerotic heart disease of native coronary artery without angina pectoris: Secondary | ICD-10-CM | POA: Insufficient documentation

## 2019-07-17 DIAGNOSIS — E1136 Type 2 diabetes mellitus with diabetic cataract: Secondary | ICD-10-CM | POA: Insufficient documentation

## 2019-07-17 DIAGNOSIS — G4730 Sleep apnea, unspecified: Secondary | ICD-10-CM | POA: Diagnosis not present

## 2019-07-17 DIAGNOSIS — Z7982 Long term (current) use of aspirin: Secondary | ICD-10-CM | POA: Diagnosis not present

## 2019-07-17 DIAGNOSIS — I10 Essential (primary) hypertension: Secondary | ICD-10-CM | POA: Insufficient documentation

## 2019-07-17 DIAGNOSIS — J45909 Unspecified asthma, uncomplicated: Secondary | ICD-10-CM | POA: Diagnosis not present

## 2019-07-17 DIAGNOSIS — E7800 Pure hypercholesterolemia, unspecified: Secondary | ICD-10-CM | POA: Diagnosis not present

## 2019-07-17 DIAGNOSIS — Z7902 Long term (current) use of antithrombotics/antiplatelets: Secondary | ICD-10-CM | POA: Insufficient documentation

## 2019-07-17 DIAGNOSIS — Z79899 Other long term (current) drug therapy: Secondary | ICD-10-CM | POA: Insufficient documentation

## 2019-07-17 DIAGNOSIS — Z79890 Hormone replacement therapy (postmenopausal): Secondary | ICD-10-CM | POA: Insufficient documentation

## 2019-07-17 DIAGNOSIS — E039 Hypothyroidism, unspecified: Secondary | ICD-10-CM | POA: Diagnosis not present

## 2019-07-17 DIAGNOSIS — F329 Major depressive disorder, single episode, unspecified: Secondary | ICD-10-CM | POA: Insufficient documentation

## 2019-07-17 DIAGNOSIS — H3530 Unspecified macular degeneration: Secondary | ICD-10-CM | POA: Diagnosis not present

## 2019-07-17 DIAGNOSIS — F419 Anxiety disorder, unspecified: Secondary | ICD-10-CM | POA: Insufficient documentation

## 2019-07-17 DIAGNOSIS — Z7951 Long term (current) use of inhaled steroids: Secondary | ICD-10-CM | POA: Insufficient documentation

## 2019-07-17 DIAGNOSIS — Z794 Long term (current) use of insulin: Secondary | ICD-10-CM | POA: Insufficient documentation

## 2019-07-17 DIAGNOSIS — Z87891 Personal history of nicotine dependence: Secondary | ICD-10-CM | POA: Insufficient documentation

## 2019-07-17 DIAGNOSIS — H25811 Combined forms of age-related cataract, right eye: Secondary | ICD-10-CM | POA: Diagnosis not present

## 2019-07-17 DIAGNOSIS — Z79891 Long term (current) use of opiate analgesic: Secondary | ICD-10-CM | POA: Diagnosis not present

## 2019-07-17 DIAGNOSIS — I252 Old myocardial infarction: Secondary | ICD-10-CM | POA: Diagnosis not present

## 2019-07-17 HISTORY — PX: CATARACT EXTRACTION W/PHACO: SHX586

## 2019-07-17 LAB — GLUCOSE, CAPILLARY: Glucose-Capillary: 186 mg/dL — ABNORMAL HIGH (ref 70–99)

## 2019-07-17 SURGERY — PHACOEMULSIFICATION, CATARACT, WITH IOL INSERTION
Anesthesia: Monitor Anesthesia Care | Site: Eye | Laterality: Right

## 2019-07-17 MED ORDER — BSS IO SOLN
INTRAOCULAR | Status: DC | PRN
  Administered 2019-07-17: 15 mL via INTRAOCULAR

## 2019-07-17 MED ORDER — PHENYLEPHRINE HCL 2.5 % OP SOLN
1.0000 [drp] | OPHTHALMIC | Status: AC | PRN
  Administered 2019-07-17 (×3): 1 [drp] via OPHTHALMIC

## 2019-07-17 MED ORDER — LIDOCAINE HCL 3.5 % OP GEL
1.0000 "application " | Freq: Once | OPHTHALMIC | Status: AC
  Administered 2019-07-17: 1 via OPHTHALMIC

## 2019-07-17 MED ORDER — SODIUM CHLORIDE 0.9% FLUSH
10.0000 mL | INTRAVENOUS | Status: DC | PRN
  Administered 2019-07-17 (×2): 3 mL via INTRAVENOUS

## 2019-07-17 MED ORDER — MIDAZOLAM HCL 2 MG/2ML IJ SOLN
INTRAMUSCULAR | Status: DC | PRN
  Administered 2019-07-17: 1 mg via INTRAVENOUS
  Administered 2019-07-17: 2 mg via INTRAVENOUS

## 2019-07-17 MED ORDER — LACTATED RINGERS IV SOLN
INTRAVENOUS | Status: DC
Start: 2019-07-17 — End: 2019-07-17

## 2019-07-17 MED ORDER — SODIUM CHLORIDE (PF) 0.9 % IJ SOLN
INTRAMUSCULAR | Status: AC
  Filled 2019-07-17: qty 20

## 2019-07-17 MED ORDER — SODIUM HYALURONATE 23 MG/ML IO SOLN
INTRAOCULAR | Status: DC | PRN
  Administered 2019-07-17: 0.6 mL via INTRAOCULAR

## 2019-07-17 MED ORDER — LIDOCAINE HCL (PF) 1 % IJ SOLN
INTRAOCULAR | Status: DC | PRN
  Administered 2019-07-17: 1 mL via OPHTHALMIC

## 2019-07-17 MED ORDER — CYCLOPENTOLATE-PHENYLEPHRINE 0.2-1 % OP SOLN
1.0000 [drp] | OPHTHALMIC | Status: AC | PRN
  Administered 2019-07-17 (×3): 1 [drp] via OPHTHALMIC

## 2019-07-17 MED ORDER — MIDAZOLAM HCL 2 MG/2ML IJ SOLN
INTRAMUSCULAR | Status: AC
  Filled 2019-07-17: qty 2

## 2019-07-17 MED ORDER — TETRACAINE HCL 0.5 % OP SOLN
1.0000 [drp] | OPHTHALMIC | Status: AC | PRN
  Administered 2019-07-17 (×3): 1 [drp] via OPHTHALMIC

## 2019-07-17 MED ORDER — NEOMYCIN-POLYMYXIN-DEXAMETH 3.5-10000-0.1 OP SUSP
OPHTHALMIC | Status: DC | PRN
  Administered 2019-07-17: 1 [drp] via OPHTHALMIC

## 2019-07-17 MED ORDER — POVIDONE-IODINE 5 % OP SOLN
OPHTHALMIC | Status: DC | PRN
  Administered 2019-07-17: 1 via OPHTHALMIC

## 2019-07-17 MED ORDER — PROVISC 10 MG/ML IO SOLN
INTRAOCULAR | Status: DC | PRN
  Administered 2019-07-17: 0.85 mL via INTRAOCULAR

## 2019-07-17 MED ORDER — EPINEPHRINE PF 1 MG/ML IJ SOLN
INTRAOCULAR | Status: DC | PRN
  Administered 2019-07-17: 500 mL

## 2019-07-17 SURGICAL SUPPLY — 15 items
CLOTH BEACON ORANGE TIMEOUT ST (SAFETY) ×1 IMPLANT
EYE SHIELD UNIVERSAL CLEAR (GAUZE/BANDAGES/DRESSINGS) ×1 IMPLANT
GLOVE BIOGEL PI IND STRL 6.5 (GLOVE) IMPLANT
GLOVE BIOGEL PI IND STRL 7.0 (GLOVE) IMPLANT
GLOVE BIOGEL PI INDICATOR 6.5 (GLOVE) ×1
GLOVE BIOGEL PI INDICATOR 7.0 (GLOVE) ×1
LENS ALC ACRYL/TECN (Ophthalmic Related) ×1 IMPLANT
NDL HYPO 18GX1.5 BLUNT FILL (NEEDLE) IMPLANT
NEEDLE HYPO 18GX1.5 BLUNT FILL (NEEDLE) ×2 IMPLANT
PAD ARMBOARD 7.5X6 YLW CONV (MISCELLANEOUS) ×1 IMPLANT
SYR TB 1ML LL NO SAFETY (SYRINGE) ×1 IMPLANT
TAPE SURG TRANSPORE 1 IN (GAUZE/BANDAGES/DRESSINGS) IMPLANT
TAPE SURGICAL TRANSPORE 1 IN (GAUZE/BANDAGES/DRESSINGS) ×2
VISCOELASTIC ADDITIONAL (OPHTHALMIC RELATED) ×1 IMPLANT
WATER STERILE IRR 250ML POUR (IV SOLUTION) ×1 IMPLANT

## 2019-07-17 NOTE — Op Note (Signed)
Date of procedure: 07/17/19  Pre-operative diagnosis:  Visually significant combined form age-related cataract, Right Eye (H25.811)  Post-operative diagnosis:  Visually significant combined form age-related cataract, Right Eye (H25.811)  Procedure: Removal of cataract via phacoemulsification and insertion of intra-ocular lens Wynetta Emery and [employer] DCB00  +18.0D into the capsular bag of the Right Eye  Attending surgeon: Gerda Diss. Jamerica Snavely, MD, MA  Anesthesia: MAC, Topical Akten  Complications: None  Estimated Blood Loss: <64m (minimal)  Specimens: None  Implants: As above  Indications:  Visually significant age-related cataract, Right Eye  Procedure:  The patient was seen and identified in the pre-operative area. The operative eye was identified and dilated.  The operative eye was marked.  Topical anesthesia was administered to the operative eye.     The patient was then to the operative suite and placed in the supine position.  A timeout was performed confirming the patient, procedure to be performed, and all other relevant information.   The patient's face was prepped and draped in the usual fashion for intra-ocular surgery.  A lid speculum was placed into the operative eye and the surgical microscope moved into place and focused.  A superotemporal paracentesis was created using a 20 gauge paracentesis blade.  Shugarcaine was injected into the anterior chamber.  Viscoelastic was injected into the anterior chamber.  A temporal clear-corneal main wound incision was created using a 2.460mmicrokeratome.  A continuous curvilinear capsulorrhexis was initiated using an irrigating cystitome and completed using capsulorrhexis forceps.  Hydrodissection and hydrodeliniation were performed.  Viscoelastic was injected into the anterior chamber.  A phacoemulsification handpiece and a chopper as a second instrument were used to remove the nucleus and epinucleus. The irrigation/aspiration handpiece was  used to remove any remaining cortical material.   The capsular bag was reinflated with viscoelastic, checked, and found to be intact.  The intraocular lens was inserted into the capsular bag.  The irrigation/aspiration handpiece was used to remove any remaining viscoelastic.  The clear corneal wound and paracentesis wounds were then hydrated and checked with Weck-Cels to be watertight.  The lid-speculum was removed.  The drape was removed.  The patient's face was cleaned with a wet and dry 4x4.   Maxitrol was instilled in the eye. A clear shield was taped over the eye. The patient was taken to the post-operative care unit in good condition, having tolerated the procedure well.  Post-Op Instructions: [facility] and will receive all other orders and instructions.

## 2019-07-17 NOTE — Anesthesia Procedure Notes (Signed)
Procedure Name: MAC Date/Time: 07/17/2019 8:22 AM Performed by: Vista Deck, CRNA Pre-anesthesia Checklist: Patient identified, Emergency Drugs available, Suction available, Timeout performed and Patient being monitored Patient Re-evaluated:Patient Re-evaluated prior to induction Oxygen Delivery Method: Nasal Cannula

## 2019-07-17 NOTE — Anesthesia Postprocedure Evaluation (Signed)
Anesthesia Post Note  Patient: [name]  Procedure(s) Performed: CATARACT EXTRACTION PHACO AND INTRAOCULAR LENS PLACEMENT (IOC) (Right Eye)  Patient location during evaluation: Short Stay Anesthesia Type: MAC Level of consciousness: awake and alert and patient cooperative Pain management: satisfactory to patient Vital Signs Assessment: post-procedure vital signs reviewed and stable Respiratory status: spontaneous breathing Cardiovascular status: stable Postop Assessment: no apparent nausea or vomiting Anesthetic complications: no   No complications documented.   Last Vitals:  Vitals:   07/17/19 0719 07/17/19 0842  BP: (!) 141/70 (!) 141/64  Pulse: (!) 56 (!) 55  Resp: 19 18  Temp: (!) 36.4 C 36.7 C  SpO2: 96% 98%    Last Pain:  Vitals:   07/17/19 0842  TempSrc: Oral  PainSc: 0-No pain                 Doneta Bayman

## 2019-07-17 NOTE — Discharge Instructions (Signed)
Please discharge patient when stable, will follow up today with Dr. Shahd Occhipinti at the [facility] [place] office immediately following discharge.  Leave shield in place until visit.  All paperwork with discharge instructions will be given at the office.  [facility] Address:  [address]  , [place] [address]             Monitored Anesthesia Care, Care After These instructions provide you with information about caring for yourself after your procedure. Your health care provider may also give you more specific instructions. Your treatment has been planned according to current medical practices, but problems sometimes occur. Call your health care provider if you have any problems or questions after your procedure. What can I expect after the procedure? After your procedure, you may:  Feel sleepy for several hours.  Feel clumsy and have poor balance for several hours.  Feel forgetful about what happened after the procedure.  Have poor judgment for several hours.  Feel nauseous or vomit.  Have a sore throat if you had a breathing tube during the procedure. Follow these instructions at home: For at least 24 hours after the procedure:      Have a responsible adult stay with you. It is important to have someone help care for you until you are awake and alert.  Rest as needed.  Do not: ? Participate in activities in which you could fall or become injured. ? Drive. ? Use heavy machinery. ? Drink alcohol. ? Take sleeping pills or medicines that cause drowsiness. ? Make important decisions or sign legal documents. ? Take care of children on your own. Eating and drinking  Follow the diet that is recommended by your health care provider.  If you vomit, drink water, juice, or soup when you can drink without vomiting.  Make sure you have little or no nausea before eating solid foods. General instructions  Take over-the-counter and  prescription medicines only as told by your health care provider.  If you have sleep apnea, surgery and certain medicines can increase your risk for breathing problems. Follow instructions from your health care provider about wearing your sleep device: ? Anytime you are sleeping, including during daytime naps. ? While taking prescription pain medicines, sleeping medicines, or medicines that make you drowsy.  If you smoke, do not smoke without supervision.  Keep all follow-up visits as told by your health care provider. This is important. Contact a health care provider if:  You keep feeling nauseous or you keep vomiting.  You feel light-headed.  You develop a rash.  You have a fever. Get help right away if:  You have trouble breathing. Summary  For several hours after your procedure, you may feel sleepy and have poor judgment.  Have a responsible adult stay with you for at least 24 hours or until you are awake and alert. This information is not intended to replace advice given to you by your health care provider. Make sure you discuss any questions you have with your health care provider. Document Revised: 04/19/2017 Document Reviewed: 05/12/2015 Elsevier Patient Education  2020 [employer]  

## 2019-07-17 NOTE — Anesthesia Preprocedure Evaluation (Signed)
Anesthesia Evaluation  Patient identified by MRN, date of birth, ID band Patient awake    Reviewed: Allergy & Precautions, H&P , NPO status , Patient's Chart, lab work & pertinent test results, reviewed documented beta blocker date and time   History of Anesthesia Complications (+) PONV and history of anesthetic complications  Airway Mallampati: III  TM Distance: >3 FB Neck ROM: full    Dental no notable dental hx. (+) Teeth Intact   Pulmonary asthma , sleep apnea ,    Pulmonary exam normal breath sounds clear to auscultation       Cardiovascular Exercise Tolerance: Good hypertension, + CAD and + Past MI   Rhythm:regular Rate:Normal     Neuro/Psych PSYCHIATRIC DISORDERS Anxiety Depression negative neurological ROS     GI/Hepatic Neg liver ROS, GERD  ,  Endo/Other  diabetesHypothyroidism   Renal/GU negative Renal ROS  negative genitourinary   Musculoskeletal   Abdominal   Peds  Hematology negative hematology ROS (+)   Anesthesia Other Findings   Reproductive/Obstetrics negative OB ROS                             Anesthesia Physical Anesthesia Plan  ASA: III  Anesthesia Plan: MAC   Post-op Pain Management:    Induction:   PONV Risk Score and Plan: 3  Airway Management Planned:   Additional Equipment:   Intra-op Plan:   Post-operative Plan:   Informed Consent: I have reviewed the patients History and Physical, chart, labs and discussed the procedure including the risks, benefits and alternatives for the proposed anesthesia with the patient or authorized representative who has indicated his/her understanding and acceptance.     Dental Advisory Given  Plan Discussed with: CRNA  Anesthesia Plan Comments:         Anesthesia Quick Evaluation

## 2019-07-17 NOTE — Interval H&P Note (Signed)
History and Physical Interval Note: The H and P was reviewed and updated. The patient was examined.  No changes were found after exam.  The surgical eye was marked.  07/17/2019 8:16 AM  [name]  has presented today for surgery, with the diagnosis of Nuclear sclerotic cataract - Right eye.  The various methods of treatment have been discussed with the patient and family. After consideration of risks, benefits and other options for treatment, the patient has consented to  Procedure(s) with comments: CATARACT EXTRACTION PHACO AND INTRAOCULAR LENS PLACEMENT (IOC) (Right) - right as a surgical intervention.  The patient's history has been reviewed, patient examined, no change in status, stable for surgery.  I have reviewed the patient's chart and labs.  Questions were answered to the patient's satisfaction.     Baruch Goldmann

## 2019-07-17 NOTE — Transfer of Care (Signed)
Immediate Anesthesia Transfer of Care Note  Patient: [name]  Procedure(s) Performed: CATARACT EXTRACTION PHACO AND INTRAOCULAR LENS PLACEMENT (IOC) (Right Eye)  Patient Location: Short Stay  Anesthesia Type:MAC  Level of Consciousness: awake, alert  and patient cooperative  Airway & Oxygen Therapy: Patient Spontanous Breathing  Post-op Assessment: Report given to RN and Post -op Vital signs reviewed and stable  Post vital signs: Reviewed and stable  Last Vitals:  Vitals Value Taken Time  BP    Temp    Pulse    Resp    SpO2      Last Pain:  Vitals:   07/17/19 0719  TempSrc: Oral  PainSc: 6        SEE PACU FLOW SHEETS FOP VITAL SIGNS  Complications: No complications documented.

## 2019-07-18 ENCOUNTER — Encounter (HOSPITAL_COMMUNITY): Payer: Self-pay | Admitting: Ophthalmology

## 2019-07-20 ENCOUNTER — Ambulatory Visit (HOSPITAL_COMMUNITY)
Admission: RE | Admit: 2019-07-20 | Discharge: 2019-07-20 | Disposition: A | Discharge status: Home / Self Care | Payer: Medicare Other | Source: Ambulatory Visit | Attending: Nurse Practitioner | Admitting: Nurse Practitioner

## 2019-07-20 ENCOUNTER — Other Ambulatory Visit: Payer: Self-pay

## 2019-07-20 DIAGNOSIS — R209 Unspecified disturbances of skin sensation: Secondary | ICD-10-CM | POA: Diagnosis not present

## 2019-07-25 NOTE — H&P (Signed)
Surgical History & Physical  Patient Name: [name] DOB: Jul 19, 1955  Surgery: Cataract extraction with intraocular lens implant phacoemulsification; Left Eye  Surgeon: Baruch Goldmann MD Surgery Date:  07/31/2019 Pre-Op Date:  07/24/2019  HPI: A 89 Yr. old female patient 1. 1. The patient complains of difficulty when viewing TV, reading closed caption, news scrolls on TV, which began many years ago. The left eye is affected. The condition's severity is worsening. The condition is worse with daily activities. The complaint is associated with blurry vision. This is negatively affecting the patient's quality of life. 2. The patient is returning after cataract post-op. The right eye is affected. Since the last visit, the affected area is doing well. The patient's vision is blurry. Patient is following medication instructions. Patient feels as if there has not been much improvement in vision OD since surgery. HPI was performed by Baruch Goldmann .  Medical History: Cataracts CE IOL OD Macula Degeneration Depression Diabetes High Blood Pressure LDL Lung Problems Thyroid Problems  Review of Systems Negative Allergic/Immunologic Negative Cardiovascular Negative Constitutional Negative Ear, Nose, Mouth & Throat Negative Endocrine Negative Eyes Negative Gastrointestinal Negative Genitourinary Negative Hemotologic/Lymphatic Negative Integumentary Negative Musculoskeletal Negative Neurological Negative Psychiatry Negative Respiratory  Social   Former smoker   Medication Prednisolone-gatiflox-bromfenac,  Acyclovir, Aspirin, Cyclobenzaprine, Vitamin D2, Furosemide, Gabapentin, Humalog, Insulin deglerdec, Levothyroxine Sodium, Lidocaine oint., Losartan Potassium, Albuterol, Symbicort Inhalant Product, Metoprolol, Oxycodone, Potassium chloride, Prasugel, Premarin, Rosuvastatin, Sertraline, Tiatropium,   Sx/Procedures Phaco c IOL OD,  Hysterectomy, Gallbladder Sx, Heart stent, Colon  cancer, Hernia, Elbow,   Drug Allergies   NKDA  History & Physical: Heent:  Cataract, Left eye NECK: supple without bruits LUNGS: lungs clear to auscultation CV: regular rate and rhythm Abdomen: soft and non-tender  Impression & Plan: Assessment: 1.  CATARACT EXTRACTION STATUS; Right Eye (Z98.41) 2.  NUCLEAR SCLEROSIS AGE RELATED; Left Eye (H25.12) 3.  COMBINED FORMS AGE RELATED CATARACT; Right Eye (H25.811) - Resolved  Plan: 1.  1 week after cataract surgery. Doing well with improved vision and normal eye pressure. Call with any problems or concerns. Continue Gati-Brom-Pred 2x/day for 3 more weeks. 2.  Cataract accounts for the patient's decreased vision. This visual impairment is not correctable with a tolerable change in glasses or contact lenses. Cataract surgery with an implantation of a new lens should significantly improve the visual and functional status of the patient. Discussed all risks, benefits, alternatives, and potential complications. Discussed the procedures and recovery. Patient desires to have surgery. A-scan ordered and performed today for intra-ocular lens calculations. The surgery will be performed in order to improve vision for driving, reading, and for eye examinations. Recommend phacoemulsification with intra-ocular lens. Left Eye. Surgery required to correct imbalance of vision. Dilates well - shugarcaine by protocol.

## 2019-07-26 ENCOUNTER — Encounter (HOSPITAL_COMMUNITY): Payer: Self-pay

## 2019-07-26 ENCOUNTER — Other Ambulatory Visit: Payer: Self-pay

## 2019-07-27 ENCOUNTER — Encounter (HOSPITAL_COMMUNITY)
Admission: RE | Admit: 2019-07-27 | Discharge: 2019-07-27 | Disposition: A | Discharge status: Home / Self Care | Payer: Medicare Other | Source: Ambulatory Visit | Attending: Ophthalmology | Admitting: Ophthalmology

## 2019-07-28 ENCOUNTER — Other Ambulatory Visit (HOSPITAL_COMMUNITY)
Admission: RE | Admit: 2019-07-28 | Discharge: 2019-07-28 | Disposition: A | Discharge status: Home / Self Care | Payer: Medicare Other | Source: Ambulatory Visit | Attending: Ophthalmology | Admitting: Ophthalmology

## 2019-07-28 ENCOUNTER — Other Ambulatory Visit: Payer: Self-pay

## 2019-07-28 DIAGNOSIS — Z20822 Contact with and (suspected) exposure to covid-19: Secondary | ICD-10-CM | POA: Diagnosis not present

## 2019-07-28 DIAGNOSIS — Z01812 Encounter for preprocedural laboratory examination: Secondary | ICD-10-CM | POA: Diagnosis present

## 2019-07-28 LAB — SARS CORONAVIRUS 2 (TAT 6-24 HRS): SARS Coronavirus 2: NEGATIVE

## 2019-07-31 ENCOUNTER — Ambulatory Visit (HOSPITAL_COMMUNITY)
Admission: RE | Admit: 2019-07-31 | Discharge: 2019-07-31 | Disposition: A | Discharge status: Home / Self Care | Payer: Medicare Other | Attending: Ophthalmology | Admitting: Ophthalmology

## 2019-07-31 ENCOUNTER — Other Ambulatory Visit: Payer: Self-pay

## 2019-07-31 ENCOUNTER — Ambulatory Visit (HOSPITAL_COMMUNITY): Payer: Medicare Other | Admitting: Anesthesiology

## 2019-07-31 ENCOUNTER — Encounter (HOSPITAL_COMMUNITY): Payer: Self-pay | Admitting: Ophthalmology

## 2019-07-31 ENCOUNTER — Encounter (HOSPITAL_COMMUNITY)
Admission: RE | Disposition: A | Discharge status: Home / Self Care | Payer: Self-pay | Source: Home / Self Care | Attending: Ophthalmology

## 2019-07-31 DIAGNOSIS — Z7982 Long term (current) use of aspirin: Secondary | ICD-10-CM | POA: Diagnosis not present

## 2019-07-31 DIAGNOSIS — Z79899 Other long term (current) drug therapy: Secondary | ICD-10-CM | POA: Insufficient documentation

## 2019-07-31 DIAGNOSIS — I10 Essential (primary) hypertension: Secondary | ICD-10-CM | POA: Diagnosis not present

## 2019-07-31 DIAGNOSIS — G4730 Sleep apnea, unspecified: Secondary | ICD-10-CM | POA: Insufficient documentation

## 2019-07-31 DIAGNOSIS — Z79890 Hormone replacement therapy (postmenopausal): Secondary | ICD-10-CM | POA: Insufficient documentation

## 2019-07-31 DIAGNOSIS — H25812 Combined forms of age-related cataract, left eye: Secondary | ICD-10-CM | POA: Insufficient documentation

## 2019-07-31 DIAGNOSIS — Z794 Long term (current) use of insulin: Secondary | ICD-10-CM | POA: Insufficient documentation

## 2019-07-31 DIAGNOSIS — Z87891 Personal history of nicotine dependence: Secondary | ICD-10-CM | POA: Insufficient documentation

## 2019-07-31 DIAGNOSIS — E1136 Type 2 diabetes mellitus with diabetic cataract: Secondary | ICD-10-CM | POA: Insufficient documentation

## 2019-07-31 DIAGNOSIS — F329 Major depressive disorder, single episode, unspecified: Secondary | ICD-10-CM | POA: Insufficient documentation

## 2019-07-31 DIAGNOSIS — E039 Hypothyroidism, unspecified: Secondary | ICD-10-CM | POA: Insufficient documentation

## 2019-07-31 HISTORY — PX: CATARACT EXTRACTION W/PHACO: SHX586

## 2019-07-31 LAB — GLUCOSE, CAPILLARY
Glucose-Capillary: 201 mg/dL — ABNORMAL HIGH (ref 70–99)
Glucose-Capillary: 163 mg/dL — ABNORMAL HIGH (ref 70–99)

## 2019-07-31 SURGERY — PHACOEMULSIFICATION, CATARACT, WITH IOL INSERTION
Anesthesia: Monitor Anesthesia Care | Site: Eye | Laterality: Left

## 2019-07-31 MED ORDER — POVIDONE-IODINE 5 % OP SOLN
OPHTHALMIC | Status: DC | PRN
  Administered 2019-07-31: 1 via OPHTHALMIC

## 2019-07-31 MED ORDER — BSS IO SOLN
INTRAOCULAR | Status: DC | PRN
  Administered 2019-07-31: 15 mL via INTRAOCULAR

## 2019-07-31 MED ORDER — PHENYLEPHRINE HCL 2.5 % OP SOLN
1.0000 [drp] | OPHTHALMIC | Status: AC | PRN
  Administered 2019-07-31 (×3): 1 [drp] via OPHTHALMIC

## 2019-07-31 MED ORDER — FENTANYL CITRATE (PF) 100 MCG/2ML IJ SOLN
INTRAMUSCULAR | Status: AC
  Filled 2019-07-31: qty 2

## 2019-07-31 MED ORDER — MIDAZOLAM HCL 2 MG/2ML IJ SOLN
INTRAMUSCULAR | Status: DC | PRN
  Administered 2019-07-31: 1 mg via INTRAVENOUS

## 2019-07-31 MED ORDER — LIDOCAINE HCL 3.5 % OP GEL
1.0000 "application " | Freq: Once | OPHTHALMIC | Status: AC
  Administered 2019-07-31: 1 via OPHTHALMIC

## 2019-07-31 MED ORDER — EPINEPHRINE PF 1 MG/ML IJ SOLN
INTRAMUSCULAR | Status: AC
  Filled 2019-07-31: qty 2

## 2019-07-31 MED ORDER — MIDAZOLAM HCL 2 MG/2ML IJ SOLN
INTRAMUSCULAR | Status: AC
  Filled 2019-07-31: qty 2

## 2019-07-31 MED ORDER — NEOMYCIN-POLYMYXIN-DEXAMETH 3.5-10000-0.1 OP SUSP
OPHTHALMIC | Status: DC | PRN
  Administered 2019-07-31: 1 [drp] via OPHTHALMIC

## 2019-07-31 MED ORDER — FENTANYL CITRATE (PF) 100 MCG/2ML IJ SOLN
INTRAMUSCULAR | Status: DC | PRN
  Administered 2019-07-31: 50 ug via INTRAVENOUS

## 2019-07-31 MED ORDER — PROVISC 10 MG/ML IO SOLN
INTRAOCULAR | Status: DC | PRN
  Administered 2019-07-31: 0.85 mL via INTRAOCULAR

## 2019-07-31 MED ORDER — TETRACAINE HCL 0.5 % OP SOLN
1.0000 [drp] | OPHTHALMIC | Status: AC | PRN
  Administered 2019-07-31 (×3): 1 [drp] via OPHTHALMIC

## 2019-07-31 MED ORDER — EPINEPHRINE PF 1 MG/ML IJ SOLN
INTRAOCULAR | Status: DC | PRN
  Administered 2019-07-31: 500 mL

## 2019-07-31 MED ORDER — SODIUM HYALURONATE 23 MG/ML IO SOLN
INTRAOCULAR | Status: DC | PRN
  Administered 2019-07-31: 0.6 mL via INTRAOCULAR

## 2019-07-31 MED ORDER — LACTATED RINGERS IV SOLN: INTRAVENOUS | Status: DC

## 2019-07-31 MED ORDER — CYCLOPENTOLATE-PHENYLEPHRINE 0.2-1 % OP SOLN
1.0000 [drp] | OPHTHALMIC | Status: AC | PRN
  Administered 2019-07-31 (×3): 1 [drp] via OPHTHALMIC

## 2019-07-31 MED ORDER — LIDOCAINE HCL (PF) 1 % IJ SOLN
INTRAOCULAR | Status: DC | PRN
  Administered 2019-07-31: 1 mL via OPHTHALMIC

## 2019-07-31 SURGICAL SUPPLY — 12 items
CLOTH BEACON ORANGE TIMEOUT ST (SAFETY) ×2 IMPLANT
EYE SHIELD UNIVERSAL CLEAR (GAUZE/BANDAGES/DRESSINGS) ×2 IMPLANT
GLOVE BIOGEL PI IND STRL 7.0 (GLOVE) IMPLANT
GLOVE BIOGEL PI INDICATOR 7.0 (GLOVE) ×2
LENS ALC ACRYL/TECN (Ophthalmic Related) ×1 IMPLANT
NDL HYPO 18GX1.5 BLUNT FILL (NEEDLE) IMPLANT
NEEDLE HYPO 18GX1.5 BLUNT FILL (NEEDLE) ×2 IMPLANT
PAD ARMBOARD 7.5X6 YLW CONV (MISCELLANEOUS) ×1 IMPLANT
SYR TB 1ML LL NO SAFETY (SYRINGE) ×1 IMPLANT
TAPE PAPER MEDFIX 1IN X 10YD (GAUZE/BANDAGES/DRESSINGS) ×2 IMPLANT
VISCOELASTIC ADDITIONAL (OPHTHALMIC RELATED) ×1 IMPLANT
WATER STERILE IRR 250ML POUR (IV SOLUTION) ×1 IMPLANT

## 2019-07-31 NOTE — Interval H&P Note (Signed)
History and Physical Interval Note:  07/31/2019 1:55 PM  [name]  has presented today for surgery, with the diagnosis of Nuclear sclerotic cataract - Left eye.  The various methods of treatment have been discussed with the patient and family. After consideration of risks, benefits and other options for treatment, the patient has consented to  Procedure(s) with comments: CATARACT EXTRACTION PHACO AND INTRAOCULAR LENS PLACEMENT LEFT EYE (Left) - CDE:  as a surgical intervention.  The patient's history has been reviewed, patient examined, no change in status, stable for surgery.  I have reviewed the patient's chart and labs.  Questions were answered to the patient's satisfaction.     Baruch Goldmann

## 2019-07-31 NOTE — Op Note (Signed)
Date of procedure: 07/31/19  Pre-operative diagnosis: Visually significant age-related combined cataract, Left Eye (H25.812)  Post-operative diagnosis: Visually significant age-related combined cataract, Left Eye (H25.812)  Procedure: Removal of cataract via phacoemulsification and insertion of intra-ocular lens Wynetta Emery and [place]  +21.5D into the capsular bag of the Left Eye  Attending surgeon: Gerda Diss. Conor Filsaime, MD, MA  Anesthesia: MAC, Topical [place]  Complications: None  Estimated Blood Loss: <56m (minimal)  Specimens: None  Implants: As above  Indications:  Visually significant age-related cataract, Left Eye  Procedure:  The patient was seen and identified in the pre-operative area. The operative eye was identified and dilated.  The operative eye was marked.  Topical anesthesia was administered to the operative eye.     The patient was then to the operative suite and placed in the supine position.  A timeout was performed confirming the patient, procedure to be performed, and all other relevant information.   The patient's face was prepped and draped in the usual fashion for intra-ocular surgery.  A lid speculum was placed into the operative eye and the surgical microscope moved into place and focused.  An inferotemporal paracentesis was created using a 20 gauge paracentesis blade.  Shugarcaine was injected into the anterior chamber.  Viscoelastic was injected into the anterior chamber.  A temporal clear-corneal main wound incision was created using a 2.476mmicrokeratome.  A continuous curvilinear capsulorrhexis was initiated using an irrigating cystitome and completed using capsulorrhexis forceps.  Hydrodissection and hydrodeliniation were performed.  Viscoelastic was injected into the anterior chamber.  A phacoemulsification handpiece and a chopper as a second instrument were used to remove the nucleus and epinucleus. The irrigation/aspiration handpiece was used to remove  any remaining cortical material.   The capsular bag was reinflated with viscoelastic, checked, and found to be intact.  The intraocular lens was inserted into the capsular bag.  The irrigation/aspiration handpiece was used to remove any remaining viscoelastic.  The clear corneal wound and paracentesis wounds were then hydrated and checked with Weck-Cels to be watertight.  The lid-speculum was removed.  The drape was removed.  The patient's face was cleaned with a wet and dry 4x4.   Maxitrol was instilled in the eye. A clear shield was taped over the eye. The patient was taken to the post-operative care unit in good condition, having tolerated the procedure well.  Post-Op Instructions: [facility] and will receive all other orders and instructions.

## 2019-07-31 NOTE — Anesthesia Postprocedure Evaluation (Signed)
Anesthesia Post Note  Patient: [name]  Procedure(s) Performed: CATARACT EXTRACTION PHACO AND INTRAOCULAR LENS PLACEMENT LEFT EYE (Left Eye)  Patient location during evaluation: Short Stay Anesthesia Type: MAC Level of consciousness: awake and alert Pain management: pain level controlled Vital Signs Assessment: post-procedure vital signs reviewed and stable Respiratory status: spontaneous breathing Cardiovascular status: stable Postop Assessment: no apparent nausea or vomiting Anesthetic complications: no   No complications documented.   Last Vitals:  Vitals:   07/31/19 1219  BP: 111/62  Pulse: 75  Resp: 20  Temp: 37.1 C  SpO2: 98%    Last Pain:  Vitals:   07/31/19 1219  TempSrc: Oral  PainSc: 0-No pain                 Everette Rank

## 2019-07-31 NOTE — Transfer of Care (Signed)
Immediate Anesthesia Transfer of Care Note  Patient: [name]  Procedure(s) Performed: CATARACT EXTRACTION PHACO AND INTRAOCULAR LENS PLACEMENT LEFT EYE (Left Eye)  Patient Location: Short Stay  Anesthesia Type:MAC  Level of Consciousness: awake, alert , oriented and patient cooperative  Airway & Oxygen Therapy: Patient Spontanous Breathing  Post-op Assessment: Report given to RN and Post -op Vital signs reviewed and stable  Post vital signs: Reviewed and stable  Last Vitals:  Vitals Value Taken Time  BP    Temp    Pulse    Resp    SpO2      Last Pain:  Vitals:   07/31/19 1219  TempSrc: Oral  PainSc: 0-No pain      Patients Stated Pain Goal: 6 (46/27/03 5009)  Complications: No complications documented.

## 2019-07-31 NOTE — Anesthesia Preprocedure Evaluation (Signed)
Anesthesia Evaluation  Patient identified by MRN, date of birth, ID band Patient awake    Reviewed: Allergy & Precautions, H&P , NPO status , Patient's Chart, lab work & pertinent test results, reviewed documented beta blocker date and time   History of Anesthesia Complications (+) PONV and history of anesthetic complications  Airway Mallampati: II  TM Distance: >3 FB Neck ROM: full    Dental no notable dental hx.    Pulmonary asthma , sleep apnea ,    Pulmonary exam normal breath sounds clear to auscultation       Cardiovascular Exercise Tolerance: Good hypertension, + angina + CAD and + Past MI   Rhythm:regular Rate:Normal     Neuro/Psych PSYCHIATRIC DISORDERS Anxiety Depression negative neurological ROS     GI/Hepatic Neg liver ROS, GERD  Medicated,  Endo/Other  diabetesHypothyroidism   Renal/GU Renal disease  negative genitourinary   Musculoskeletal   Abdominal   Peds  Hematology negative hematology ROS (+)   Anesthesia Other Findings   Reproductive/Obstetrics negative OB ROS                             Anesthesia Physical Anesthesia Plan  ASA: III  Anesthesia Plan: MAC   Post-op Pain Management:    Induction:   PONV Risk Score and Plan:   Airway Management Planned:   Additional Equipment:   Intra-op Plan:   Post-operative Plan:   Informed Consent: I have reviewed the patients History and Physical, chart, labs and discussed the procedure including the risks, benefits and alternatives for the proposed anesthesia with the patient or authorized representative who has indicated his/her understanding and acceptance.     Dental Advisory Given  Plan Discussed with: CRNA  Anesthesia Plan Comments:         Anesthesia Quick Evaluation

## 2019-07-31 NOTE — Discharge Instructions (Addendum)
PATIENT INSTRUCTIONS POST-ANESTHESIA  IMMEDIATELY FOLLOWING SURGERY:  Do not drive or operate machinery for the first twenty four hours after surgery.  Do not make any important decisions for twenty four hours after surgery or while taking narcotic pain medications or sedatives.  If you develop intractable nausea and vomiting or a severe headache please notify your doctor immediately.  FOLLOW-UP:  Please make an appointment with your surgeon as instructed. You do not need to follow up with anesthesia unless specifically instructed to do so.  WOUND CARE INSTRUCTIONS (if applicable):  Keep a dry clean dressing on the anesthesia/puncture wound site if there is drainage.  Once the wound has quit draining you may leave it open to air.  Generally you should leave the bandage intact for twenty four hours unless there is drainage.  If the epidural site drains for more than 36-48 hours please call the anesthesia department.  QUESTIONS?:  Please feel free to call your physician or the hospital operator if you have any questions, and they will be happy to assist you.      Please discharge patient when stable, will follow up today with Dr. Wrzosek at the [facility] [place] office immediately following discharge.  Leave shield in place until visit.  All paperwork with discharge instructions will be given at the office.  [facility] [place] Address:  [address]  [place], [place] [address]  

## 2019-08-02 ENCOUNTER — Encounter (HOSPITAL_COMMUNITY): Payer: Self-pay | Admitting: Ophthalmology

## 2019-08-07 ENCOUNTER — Other Ambulatory Visit: Payer: Self-pay | Admitting: Interventional Cardiology

## 2019-09-01 ENCOUNTER — Other Ambulatory Visit: Payer: Self-pay | Admitting: Interventional Cardiology

## 2019-09-12 ENCOUNTER — Other Ambulatory Visit: Payer: Self-pay | Admitting: Interventional Cardiology

## 2019-10-13 ENCOUNTER — Other Ambulatory Visit: Payer: Self-pay | Admitting: Podiatry

## 2019-10-13 ENCOUNTER — Ambulatory Visit (INDEPENDENT_AMBULATORY_CARE_PROVIDER_SITE_OTHER): Payer: Medicare Other

## 2019-10-13 ENCOUNTER — Ambulatory Visit (INDEPENDENT_AMBULATORY_CARE_PROVIDER_SITE_OTHER): Payer: Medicare Other | Admitting: Podiatry

## 2019-10-13 ENCOUNTER — Other Ambulatory Visit: Payer: Self-pay

## 2019-10-13 DIAGNOSIS — S90851A Superficial foreign body, right foot, initial encounter: Secondary | ICD-10-CM | POA: Diagnosis not present

## 2019-10-13 DIAGNOSIS — S91331A Puncture wound without foreign body, right foot, initial encounter: Secondary | ICD-10-CM | POA: Diagnosis not present

## 2019-10-13 DIAGNOSIS — S9032XA Contusion of left foot, initial encounter: Secondary | ICD-10-CM

## 2019-10-13 DIAGNOSIS — M79671 Pain in right foot: Secondary | ICD-10-CM

## 2019-10-13 MED ORDER — DOXYCYCLINE HYCLATE 100 MG PO TABS: 100.0000 mg | ORAL_TABLET | Freq: Two times a day (BID) | ORAL | 0 refills | Status: DC

## 2019-10-17 ENCOUNTER — Encounter: Payer: Self-pay | Admitting: Podiatry

## 2019-10-17 NOTE — Progress Notes (Signed)
Subjective:  Patient ID: [name], female    DOB: June 02, 1955,  MRN: [id number]  Chief Complaint  Patient presents with  . Foot Pain    PT stated that she broke a glass the other day and stepped on , she said she pulled a few pieces out the other day and thinks she still has more in there     64 y.o. female presents with the above complaint.  Patient presents with a complaint of right heel puncture wound after she stepped on a glass and in the past has been able to get multiple pieces of glass out of it.  Patient states that she is knows that there is still there.  She still has pain.  She has been soaking it her pain scale is 8 out of 10.  She denies any other acute complaints.  [name].   Review of Systems: Negative except as noted in the HPI. Denies N/V/F/Ch.  Past Medical History:  Diagnosis Date  . Acid reflux   . Angina pectoris, unspecified ([place])   . Anxiety   . Arthritis   . Arthritis   . Asthma   . Cancer, colon ([place])    colon  . Chest pain   . Coronary artery disease    a. LAD PCI in 2005 b. cath in 2014 showed patent pLAD stent c. Cath 06/11/17 severe ISR of overlapping Cypher DES in pLAD s/p cutting ballon PTCA only; mild dLAD dz  . Diabetes mellitus   . Diabetic neuropathy ([place])   . GERD (gastroesophageal reflux disease)   . Heart disease   . History of skin cancer   . Hypercholesteremia   . Hypertension   . Hypothyroidism   . Myocardial infarction ([place]) 1998  . Panic attack   . PONV (postoperative nausea and vomiting)   . Sleep apnea   . Sleep apnea   . Thyroid disease     Current Outpatient Medications:  .  Empagliflozin-metFORMIN HCl ER (SYNJARDY XR) 12.06-998 MG TB24, Take by mouth., Disp: , Rfl:  .  hydrOXYzine (VISTARIL) 25 MG capsule, TAKE 1 TO 2 CAPSULE BY MOUTH THREE TIMES DAILY, AS NEEDED, FOR ITHCING, Disp: , Rfl:  .  ACCU-CHEK AVIVA PLUS test strip, USE TO CHECK BLOOD SUGAR TWO TIMES DAILY, Disp: , Rfl:  .   acyclovir (ZOVIRAX) 800 MG tablet, TAKE 1 TABLET BY MOUTH FIVE TIMES A DAY, Disp: , Rfl:  .  albuterol (PROVENTIL) (2.5 MG/3ML) 0.083% nebulizer solution, , Disp: , Rfl:  .  amoxicillin (AMOXIL) 500 MG tablet, Take 500 mg by mouth 3 (three) times daily., Disp: , Rfl:  .  amoxicillin-clavulanate (AUGMENTIN) 875-125 MG tablet, Take 1 tablet by mouth 2 (two) times daily., Disp: , Rfl:  .  aspirin EC 81 MG tablet, Take 81 mg by mouth daily., Disp: , Rfl:  .  B-D ULTRAFINE III SHORT PEN 31G X 8 MM MISC, See admin instructions., Disp: , Rfl:  .  budesonide-formoterol (SYMBICORT) 160-4.5 MCG/ACT inhaler, Inhale 2 puffs into the lungs 2 (two) times daily., Disp: , Rfl:  .  calcipotriene (DOVONOX) 0.005 % cream, Apply 1 application topically 2 (two) times daily., Disp: , Rfl:  .  Cholecalciferol (VITAMIN D3) 1.25 MG (50000 UT) CAPS, Take 1 capsule by mouth once a week., Disp: , Rfl:  .  cyclobenzaprine (FLEXERIL) 10 MG tablet, Take 10 mg by mouth 3 (three) times daily as needed for muscle spasms., Disp: , Rfl:  .  diazepam (VALIUM) 5 MG tablet, Take 5 mg by mouth 2 (two) times daily. , Disp: , Rfl:  .  diclofenac sodium (VOLTAREN) 1 % GEL, Apply 2-4 g topically 4 (four) times daily as needed (for pain.). , Disp: , Rfl: 0 .  doxycycline (VIBRA-TABS) 100 MG tablet, Take 1 tablet (100 mg total) by mouth 2 (two) times daily., Disp: 20 tablet, Rfl: 0 .  DULoxetine (CYMBALTA) 20 MG capsule, Take 2 capsules (40 mg total) by mouth daily., Disp: 60 capsule, Rfl: 0 .  estradiol (ESTRACE) 0.1 MG/GM vaginal cream, , Disp: , Rfl:  .  fluconazole (DIFLUCAN) 150 MG tablet, Take by mouth., Disp: , Rfl:  .  fluticasone (FLONASE) 50 MCG/ACT nasal spray, Place 1 spray into both nostrils 2 (two) times daily., Disp: , Rfl:  .  furosemide (LASIX) 40 MG tablet, Take 40 mg by mouth daily. , Disp: , Rfl:  .  gabapentin (NEURONTIN) 300 MG capsule, Take 300 mg by mouth 3 (three) times daily., Disp: , Rfl:  .  HUMALOG KWIKPEN 100  UNIT/ML KwikPen, INJECT 10 20 UNITS 3 TIMES A DAY BEFORE MEALS SUBCUTANEOUSLY, Disp: , Rfl:  .  hydrOXYzine (ATARAX/VISTARIL) 25 MG tablet, Take 25 mg by mouth 2 (two) times daily as needed for itching. , Disp: , Rfl: 5 .  hydrOXYzine (VISTARIL) 25 MG capsule, Take 25-50 mg by mouth 3 (three) times daily as needed., Disp: , Rfl:  .  insulin degludec (TRESIBA FLEXTOUCH) 100 UNIT/ML SOPN FlexTouch Pen, Inject 50 Units into the skin daily., Disp: , Rfl:  .  Insulin Pen Needle (B-D ULTRAFINE III SHORT PEN) 31G X 8 MM MISC, Use as directed., Disp: , Rfl:  .  isosorbide mononitrate (IMDUR) 60 MG 24 hr tablet, Take 60 mg by mouth daily., Disp: , Rfl:  .  ketoconazole (NIZORAL) 2 % shampoo, Apply topically., Disp: , Rfl:  .  ketorolac (ACULAR) 0.5 % ophthalmic solution, as directed., Disp: , Rfl:  .  levothyroxine (SYNTHROID, LEVOTHROID) 125 MCG tablet, Take 150 mcg by mouth daily before breakfast. , Disp: , Rfl:  .  lidocaine (XYLOCAINE) 5 % ointment, , Disp: , Rfl:  .  losartan (COZAAR) 25 MG tablet, Take 1 tablet (25 mg total) by mouth daily. Please keep upcoming appt in October with Dr. Tamala Julian before anymore refills. Thank you, Disp: 90 tablet, Rfl: 0 .  Magnesium Oxide 400 (240 Mg) MG TABS, Take 400 mg by mouth 2 (two) times daily., Disp: , Rfl: 5 .  metoprolol succinate (TOPROL-XL) 100 MG 24 hr tablet, Take 100 mg by mouth daily., Disp: , Rfl:  .  mirtazapine (REMERON) 30 MG tablet, Take 1 tablet (30 mg total) by mouth at bedtime., Disp: 30 tablet, Rfl: 0 .  montelukast (SINGULAIR) 10 MG tablet, Take 10 mg by mouth daily., Disp: , Rfl:  .  moxifloxacin (VIGAMOX) 0.5 % ophthalmic solution, Apply to eye as directed., Disp: , Rfl:  .  mupirocin ointment (BACTROBAN) 2 %, TOPICAL APPLY TO AFFECTED AREA EXTERNALLY TWICE A DAY AS DIRECTED, Disp: , Rfl:  .  NEOMYCIN-POLYMYXIN-HYDROCORTISONE (CORTISPORIN) 1 % SOLN OTIC solution, APPLY 1-2 DROPS TO TOE AFTER SOAKING TWICE A DAY, Disp: 10 mL, Rfl: 0 .   nitrofurantoin (MACRODANTIN) 50 MG capsule, Take 50 mg by mouth at bedtime., Disp: , Rfl:  .  nitroGLYCERIN (NITROSTAT) 0.4 MG SL tablet, PLACE 1 TABLET (0.4 MG TOTAL) UNDER THE TONGUE EVERY 5 (FIVE) MINUTES AS NEEDED FOR CHEST PAIN., Disp: 25 tablet, Rfl: 3 .  Omega-3 Fatty Acids (FISH OIL) 1000 MG CAPS, Take by mouth., Disp: , Rfl:  .  Oxycodone HCl 10 MG TABS, Take 10 mg by mouth 3 (three) times daily., Disp: , Rfl:  .  pantoprazole (PROTONIX) 40 MG tablet, Take 1 tablet (40 mg total) by mouth daily. Please make yearly appt with Dr. Tamala Julian for November before anymore refills. 1st attempt, Disp: 90 tablet, Rfl: 0 .  potassium chloride SA (K-DUR,KLOR-CON) 20 MEQ tablet, Take 20 mEq by mouth daily. , Disp: , Rfl:  .  prasugrel (EFFIENT) 10 MG TABS tablet, TAKE 1 TABLET BY MOUTH EVERY DAY, Disp: 90 tablet, Rfl: 3 .  prednisoLONE acetate (PRED FORTE) 1 % ophthalmic suspension, as directed., Disp: , Rfl:  .  promethazine (PHENERGAN) 25 MG tablet, Take 25 mg by mouth every 6 (six) hours as needed., Disp: , Rfl:  .  rosuvastatin (CRESTOR) 40 MG tablet, Take by mouth., Disp: , Rfl:  .  sertraline (ZOLOFT) 50 MG tablet, Take 50 mg by mouth daily., Disp: , Rfl:  .  SYNJARDY XR 12.06-998 MG TB24, Take 1 tablet by mouth 2 (two) times daily., Disp: , Rfl:  .  tiotropium (SPIRIVA) 18 MCG inhalation capsule, Place 18 mcg into inhaler and inhale daily. , Disp: , Rfl:  .  Vitamin D, Ergocalciferol, (DRISDOL) 50000 units CAPS capsule, Take 50,000 Units by mouth every Monday., Disp: , Rfl:   Social History   Tobacco Use  Smoking Status Never Smoker  Smokeless Tobacco Never Used    Allergies  Allergen Reactions  . Tape Other (See Comments)    Adhesive breaks me out (rash)  . Morphine Itching   Objective:  There were no vitals filed for this visit. There is no height or weight on file to calculate BMI. Constitutional Well developed. Well nourished.  Vascular Dorsalis pedis pulses palpable  bilaterally. Posterior tibial pulses palpable bilaterally. Capillary refill normal to all digits.  No cyanosis or clubbing noted. Pedal hair growth normal.  Neurologic Normal speech. Oriented to person, place, and time. Epicritic sensation to light touch grossly present bilaterally.  Dermatologic Nails well groomed and normal in appearance. No open wounds. No skin lesions.  Orthopedic:  Pain on palpation to the puncture site.  The puncture wound is measuring 0.1 x 0.1.  No clinical signs of infection noted.  No other sign no erythema noted.  No purulent drainage noted.  Small pieces of glass were taken out by me upon debridement.   Radiographs: 3 views of skeletally mature the right foot: No foreign body noted.  No remanent glasses noted.  No bony abnormalities identified. Assessment:   1. Pain of right heel   2. Right foot pain   3. Foreign body in right foot, initial encounter   4. Puncture wound of right foot, initial encounter    Plan:  Patient was evaluated and treated and all questions answered.  Right foreign body (glass) with puncture wound -I explained patient the etiology of the puncture wound and various treatment options were discussed.  Given that this was a penetration within the epidermal down to the dermal layer I believe patient will benefit from a prophylaxis antibiotic course of doxycycline.  If there is continues to be worsening redness or acute signs of infection we will plan on adding Cipro admit her to hospital for IV antibiotics.  Patient states understanding -X-rays were reviewed as described above. -Aggressive debridement of the lesion was carried out in standard technique.  One-to-one mixture of 1% lidocaine  plain half percent Marcaine plain was injected circumferentially around the puncture site.  Using chisel blade and a handle the puncture wound was debrided out.  Remanent pieces of glasses were removed in standard technique that were found to be in the level  of the subcutaneous tissue.  It appears that patient had complete resolve meant of pain upon removal glasses.  I have instructed her to continue monitoring the pain.  If it gets worse to call the office right away or go to the emergency room.  No follow-ups on file.

## 2019-10-27 ENCOUNTER — Ambulatory Visit: Payer: Medicare Other | Admitting: Podiatry

## 2019-11-08 ENCOUNTER — Ambulatory Visit: Payer: Medicare Other | Admitting: Interventional Cardiology

## 2019-12-13 ENCOUNTER — Other Ambulatory Visit: Payer: Self-pay | Admitting: Interventional Cardiology

## 2020-01-19 ENCOUNTER — Ambulatory Visit: Payer: Medicare Other | Admitting: Urology

## 2020-02-14 ENCOUNTER — Ambulatory Visit: Payer: Medicare Other | Admitting: Urology
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
## 2020-02-26 ENCOUNTER — Ambulatory Visit: Payer: Medicare Other | Admitting: Interventional Cardiology

## 2020-02-26 DIAGNOSIS — E782 Mixed hyperlipidemia: Secondary | ICD-10-CM

## 2020-02-26 DIAGNOSIS — Z7189 Other specified counseling: Secondary | ICD-10-CM

## 2020-02-26 DIAGNOSIS — I25110 Atherosclerotic heart disease of native coronary artery with unstable angina pectoris: Secondary | ICD-10-CM

## 2020-02-26 DIAGNOSIS — G4733 Obstructive sleep apnea (adult) (pediatric): Secondary | ICD-10-CM

## 2020-02-26 DIAGNOSIS — I10 Essential (primary) hypertension: Secondary | ICD-10-CM

## 2020-03-05 ENCOUNTER — Other Ambulatory Visit: Payer: Self-pay | Admitting: Interventional Cardiology

## 2020-03-13 ENCOUNTER — Other Ambulatory Visit: Payer: Self-pay | Admitting: Interventional Cardiology

## 2020-04-01 ENCOUNTER — Ambulatory Visit: Payer: Medicare Other | Admitting: Urology

## 2020-04-29 ENCOUNTER — Other Ambulatory Visit: Payer: Self-pay

## 2020-04-29 ENCOUNTER — Ambulatory Visit (INDEPENDENT_AMBULATORY_CARE_PROVIDER_SITE_OTHER): Payer: Medicare Other | Admitting: Urology

## 2020-04-29 ENCOUNTER — Encounter: Payer: Self-pay | Admitting: Urology

## 2020-04-29 VITALS — BP 113/62 | HR 69 | Temp 97.9°F | Ht 64.0 in | Wt 222.0 lb

## 2020-04-29 DIAGNOSIS — N3021 Other chronic cystitis with hematuria: Secondary | ICD-10-CM | POA: Diagnosis not present

## 2020-04-29 DIAGNOSIS — N3281 Overactive bladder: Secondary | ICD-10-CM

## 2020-04-29 DIAGNOSIS — R350 Frequency of micturition: Secondary | ICD-10-CM

## 2020-04-29 LAB — URINALYSIS, ROUTINE W REFLEX MICROSCOPIC
Bilirubin, UA: NEGATIVE
Nitrite, UA: NEGATIVE
RBC, UA: NEGATIVE
Specific Gravity, UA: 1.01 (ref 1.005–1.030)
Urobilinogen, Ur: 0.2 mg/dL (ref 0.2–1.0)
pH, UA: 5.5 (ref 5.0–7.5)

## 2020-04-29 LAB — BLADDER SCAN AMB NON-IMAGING: Scan Result: 0

## 2020-04-29 LAB — MICROSCOPIC EXAMINATION
Epithelial Cells (non renal): >10 /hpf — AB (ref 0–10)
RBC, Urine: NONE SEEN /hpf (ref 0–2)
Renal Epithel, UA: NONE SEEN /hpf

## 2020-04-29 MED ORDER — NITROFURANTOIN MACROCRYSTAL 50 MG PO CAPS: 50.0000 mg | ORAL_CAPSULE | Freq: Every day | ORAL | 3 refills | Status: DC

## 2020-04-29 MED ORDER — NITROFURANTOIN MONOHYD MACRO 100 MG PO CAPS: 100.0000 mg | ORAL_CAPSULE | Freq: Two times a day (BID) | ORAL | 0 refills | Status: DC

## 2020-04-29 NOTE — Progress Notes (Signed)
Urological Symptom Review  Patient is experiencing the following symptoms: Urinary Leakage Frequent urination Burning/painful urination Trouble starting to stream Weak stream   Review of Systems  Gastrointestinal (upper)  : Negative for upper GI symptoms  Gastrointestinal (lower) : Negative for lower GI symptoms  Constitutional : Night Sweats Fatigue  Skin: Itching  Eyes: Negative for eye symptoms  Ear/Nose/Throat : Negative for Ear/Nose/Throat symptoms  Hematologic/Lymphatic: Easy bruising  Cardiovascular : Negative for cardiovascular symptoms  Respiratory : Shortness of breath  Endocrine: Negative for endocrine symptoms  Musculoskeletal: Back pain  Neurological: Negative for neurological symptoms  Psychologic: Negative for psychiatric symptoms
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
## 2020-04-29 NOTE — Progress Notes (Signed)
Bladder Scan Patient can void: 0 ml Performed By: Durenda Guthrie, lpn

## 2020-05-01 LAB — URINE CULTURE

## 2020-05-03 NOTE — Progress Notes (Signed)
Attempted to call pt. Left message to return call.

## 2020-05-06 NOTE — Progress Notes (Signed)
Pt called and notified urine culture negative.

## 2020-05-06 NOTE — Patient Instructions (Signed)
Urinary Tract Infection, Adult A urinary tract infection (UTI) is an infection of any part of the urinary tract. The urinary tract includes:  The kidneys.  The ureters.  The bladder.  The urethra. These organs make, store, and get rid of pee (urine) in the body. What are the causes? This infection is caused by germs (bacteria) in your genital area. These germs grow and cause swelling (inflammation) of your urinary tract. What increases the risk? The following factors may make you more likely to develop this condition:  Using a small, thin tube (catheter) to drain pee.  Not being able to control when you pee or poop (incontinence).  Being female. If you are female, these things can increase the risk: ? Using these methods to prevent pregnancy:  A medicine that kills sperm (spermicide).  A device that blocks sperm (diaphragm). ? Having low levels of a female hormone (estrogen). ? Being pregnant. You are more likely to develop this condition if:  You have genes that add to your risk.  You are sexually active.  You take antibiotic medicines.  You have trouble peeing because of: ? A prostate that is bigger than normal, if you are female. ? A blockage in the part of your body that drains pee from the bladder. ? A kidney stone. ? A nerve condition that affects your bladder. ? Not getting enough to drink. ? Not peeing often enough.  You have other conditions, such as: ? Diabetes. ? A weak disease-fighting system (immune system). ? Sickle cell disease. ? Gout. ? Injury of the spine. What are the signs or symptoms? Symptoms of this condition include:  Needing to pee right away.  Peeing small amounts often.  Pain or burning when peeing.  Blood in the pee.  Pee that smells bad or not like normal.  Trouble peeing.  Pee that is cloudy.  Fluid coming from the vagina, if you are female.  Pain in the belly or lower back. Other symptoms include:  Vomiting.  Not  feeling hungry.  Feeling mixed up (confused). This may be the first symptom in older adults.  Being tired and grouchy (irritable).  A fever.  Watery poop (diarrhea). How is this treated?  Taking antibiotic medicine.  Taking other medicines.  Drinking enough water. In some cases, you may need to see a specialist. Follow these instructions at home: Medicines  Take over-the-counter and prescription medicines only as told by your doctor.  If you were prescribed an antibiotic medicine, take it as told by your doctor. Do not stop taking it even if you start to feel better. General instructions  Make sure you: ? Pee until your bladder is empty. ? Do not hold pee for a long time. ? Empty your bladder after sex. ? Wipe from front to back after peeing or pooping if you are a female. Use each tissue one time when you wipe.  Drink enough fluid to keep your pee pale yellow.  Keep all follow-up visits.   Contact a doctor if:  You do not get better after 1-2 days.  Your symptoms go away and then come back. Get help right away if:  You have very bad back pain.  You have very bad pain in your lower belly.  You have a fever.  You have chills.  You feeling like you will vomit or you vomit. Summary  A urinary tract infection (UTI) is an infection of any part of the urinary tract.  This condition is caused by   germs in your genital area.  There are many risk factors for a UTI.  Treatment includes antibiotic medicines.  Drink enough fluid to keep your pee pale yellow. This information is not intended to replace advice given to you by your health care provider. Make sure you discuss any questions you have with your health care provider. Document Revised: 09/01/2019 Document Reviewed: 09/01/2019 Elsevier Patient Education  2021 Elsevier Inc.  

## 2020-05-20 ENCOUNTER — Telehealth: Payer: Self-pay

## 2020-05-20 NOTE — Telephone Encounter (Signed)
Left a VM. Returning a call back regarding test results.  Call back: [phone]  Thanks, Helene Kelp

## 2020-05-22 NOTE — Telephone Encounter (Signed)
Returned pt call about lab results. Pt wanted to reschedule her appointment. She was rescheduled.

## 2020-06-12 ENCOUNTER — Ambulatory Visit: Payer: Medicare Other | Admitting: Urology

## 2020-06-17 ENCOUNTER — Ambulatory Visit: Payer: Medicare Other | Admitting: Interventional Cardiology

## 2020-07-03 ENCOUNTER — Ambulatory Visit: Payer: Medicare Other | Admitting: Physician Assistant

## 2020-07-24 ENCOUNTER — Ambulatory Visit (INDEPENDENT_AMBULATORY_CARE_PROVIDER_SITE_OTHER): Payer: Medicare Other | Admitting: Podiatry

## 2020-07-24 ENCOUNTER — Other Ambulatory Visit: Payer: Self-pay

## 2020-07-24 ENCOUNTER — Encounter: Payer: Self-pay | Admitting: Podiatry

## 2020-07-24 DIAGNOSIS — L600 Ingrowing nail: Secondary | ICD-10-CM

## 2020-07-27 IMAGING — DX DG CHEST 2V
2 series · 2 of 2 positions shown · non-contrast
Comparison: PA and lateral chest x-ray September 27, 2017

CLINICAL DATA: Shortness of breath. History of angioplasty.
Syncopal episode while walking. History of asthma, diabetes, never
smoked.

EXAM:
CHEST - 2 VIEW

[chest pa]
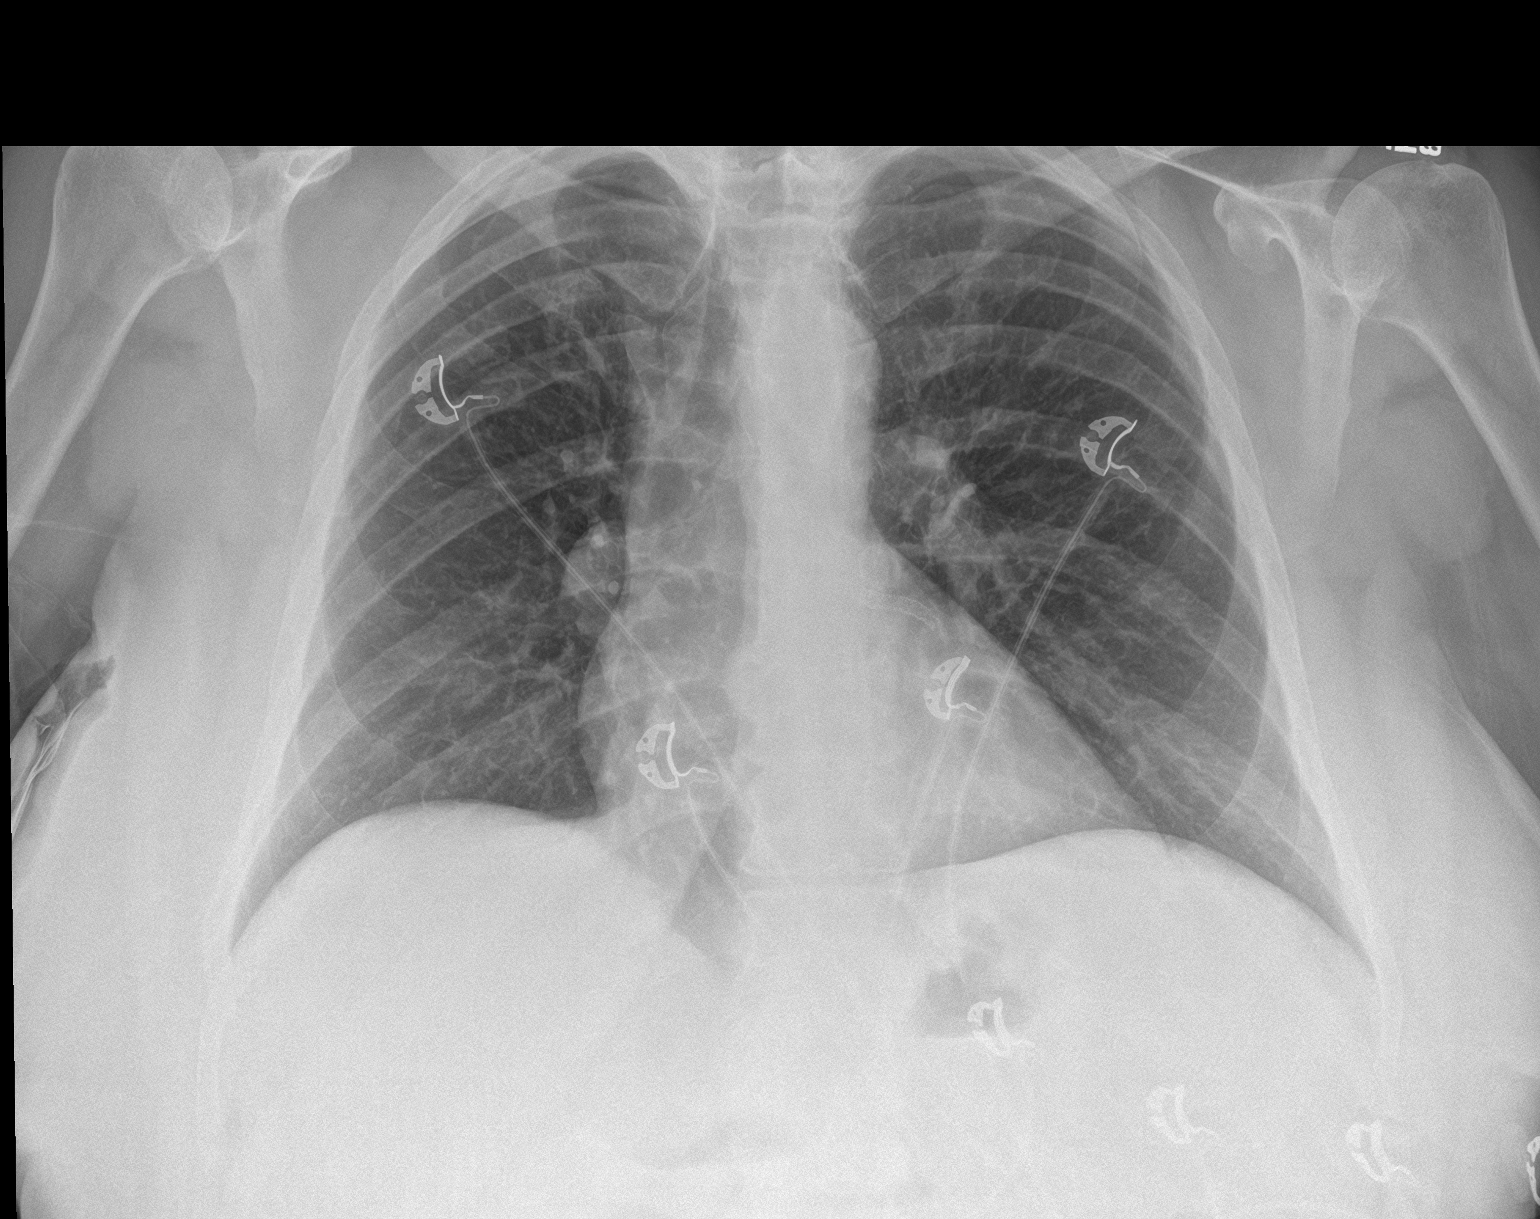

[chest lat]
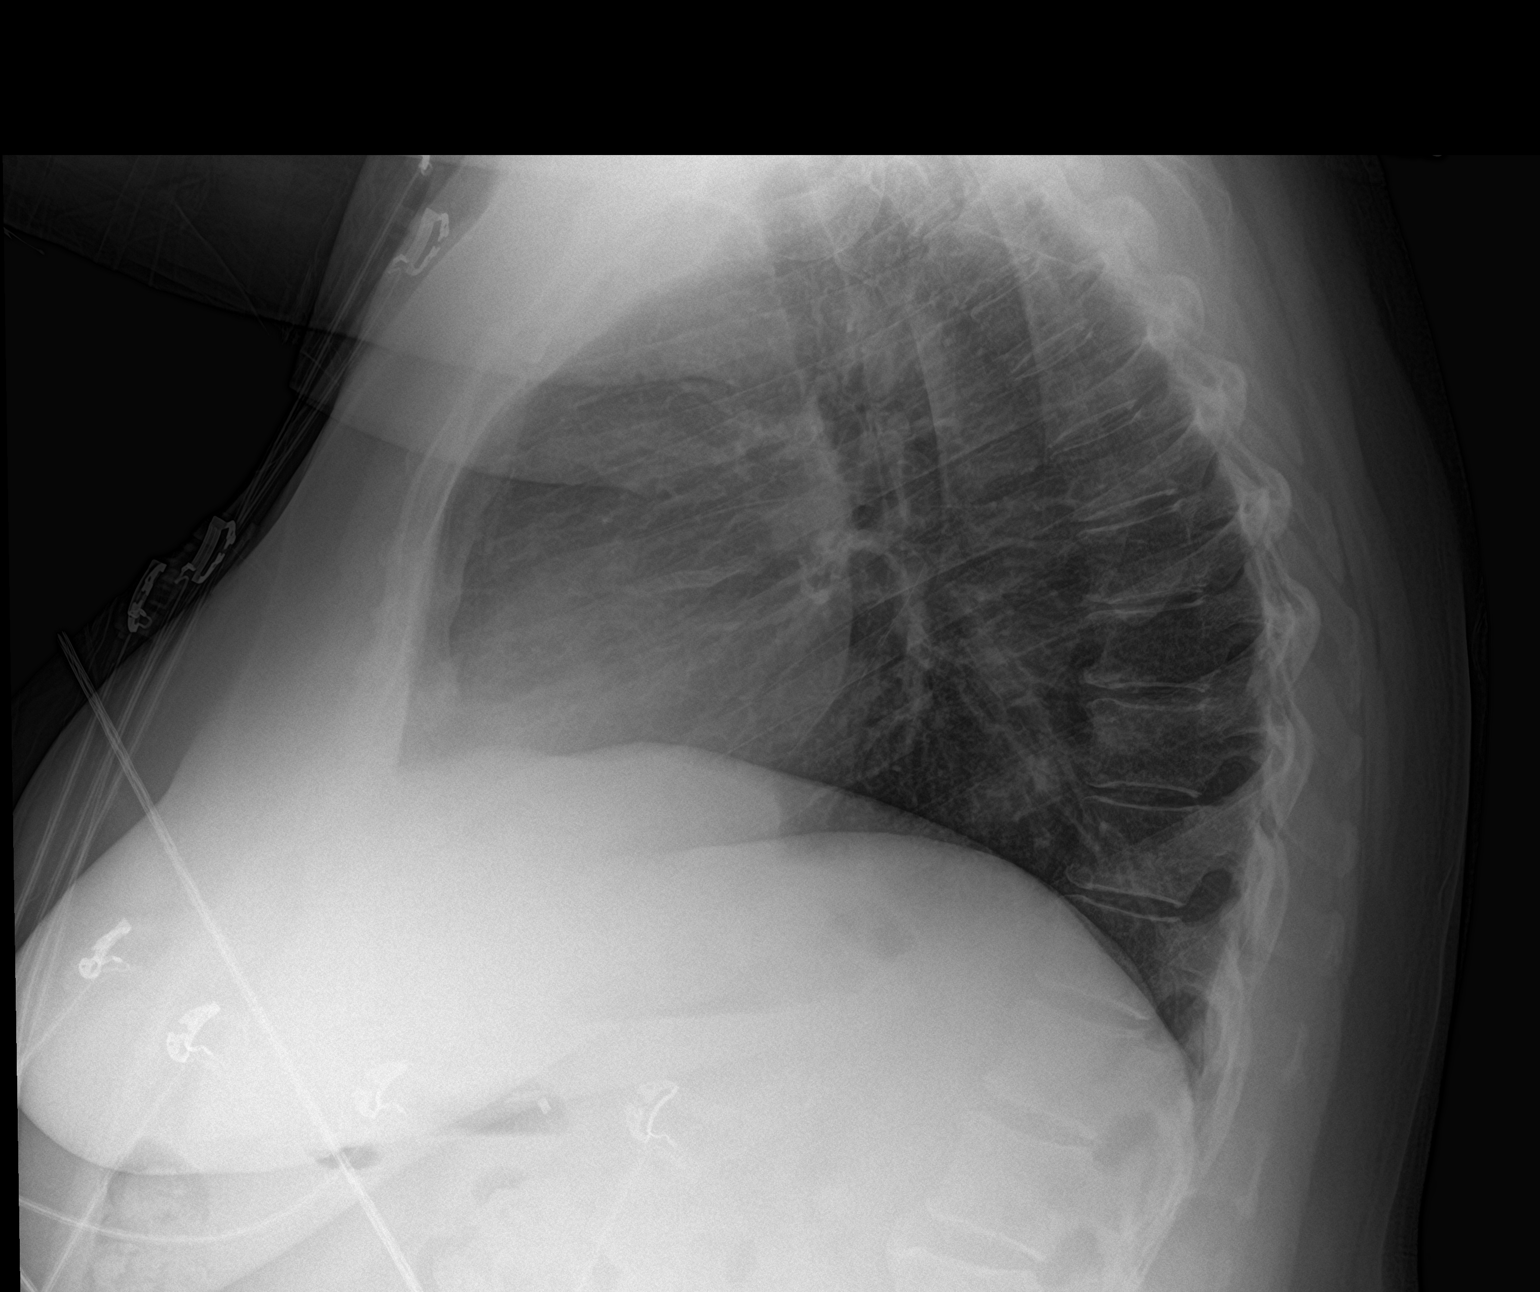

[2 of 2 positions shown; findings below may reference images not displayed]

FINDINGS: The lungs are adequately inflated. There is no focal infiltrate.
There is no pleural effusion. The heart and pulmonary vascularity
are normal. The mediastinum is normal in width. There is
calcification in the wall of the aortic arch. The bony thorax
exhibits no acute abnormality.
IMPRESSION: There is no CHF, pneumonia, nor other acute cardiopulmonary
abnormality.

Thoracic aortic atherosclerosis.

## 2020-07-28 NOTE — Progress Notes (Signed)
Subjective:   Patient ID: [name], female   DOB: 65 y.o.   MRN: [id number]   HPI Patient presents stating that this second nail on the right foot has become irritated and painful and has a growth that makes it hard for her to wear shoe gear comfortably   ROS      Objective:  Physical Exam  Neurovascular status intact with medial border right second toe incurvated painful when pressed with no active drainage noted     Assessment:  Ingrown toenail deformity right second toe medial border     Plan:  H&P reviewed condition recommended removal of the border explained procedure risk and today I infiltrated the right second toe 60 mg like Marcaine mixture sterile prep done using sterile instrumentation remove the second nail border exposed matrix applied phenol 3 applications 30 seconds followed by alcohol lavage sterile dressing gave instructions for soaks and reappoint to recheck as needed encouraging her to call with questions

## 2020-07-29 ENCOUNTER — Ambulatory Visit: Payer: Medicare Other | Admitting: Urology

## 2020-08-02 ENCOUNTER — Telehealth: Payer: Self-pay | Admitting: *Deleted

## 2020-08-02 MED ORDER — DOXYCYCLINE HYCLATE 100 MG PO TABS: 100.0000 mg | ORAL_TABLET | Freq: Two times a day (BID) | ORAL | 0 refills | Status: DC

## 2020-08-02 NOTE — Telephone Encounter (Signed)
Called patient, no answer, left vmessage that prescription (antibiotic)has been sent to pharmacy on file.

## 2020-08-02 NOTE — Telephone Encounter (Signed)
Please advise 

## 2020-08-02 NOTE — Telephone Encounter (Signed)
Patient is calling because she thinks her procedural toe may be infected. The toe is shiny red around toe, no pus or red streaking around the foot  but is swollen , painful and hard to get shoe on.  She is requesting an antibiotic. Please advise.

## 2020-08-07 ENCOUNTER — Other Ambulatory Visit: Payer: Self-pay

## 2020-08-07 ENCOUNTER — Ambulatory Visit (INDEPENDENT_AMBULATORY_CARE_PROVIDER_SITE_OTHER): Payer: Medicare Other | Admitting: Podiatry

## 2020-08-07 DIAGNOSIS — R110 Nausea: Secondary | ICD-10-CM

## 2020-08-07 DIAGNOSIS — L03031 Cellulitis of right toe: Secondary | ICD-10-CM

## 2020-08-07 MED ORDER — ONDANSETRON HCL 4 MG PO TABS: 4.0000 mg | ORAL_TABLET | Freq: Three times a day (TID) | ORAL | 0 refills | Status: DC | PRN

## 2020-08-07 MED ORDER — SULFAMETHOXAZOLE-TRIMETHOPRIM 800-160 MG PO TABS: 1.0000 | ORAL_TABLET | Freq: Two times a day (BID) | ORAL | 0 refills | Status: DC

## 2020-08-08 ENCOUNTER — Encounter: Payer: Self-pay | Admitting: Podiatry

## 2020-08-08 NOTE — Progress Notes (Signed)
Subjective:  Patient ID: [name], female    DOB: May 17, 1955,  MRN: [id number]  Chief Complaint  Patient presents with   Nail Problem    Infected toe from ingrown procedure     65 y.o. female presents with the above complaint.  Patient presents with concern for right second digit infection/paronychia status status post removal of ingrown nail procedure done by Dr. Paulla Dolly.  Patient states that it may be infected and may be pus coming out of it.  She called the on-call physician which led to her being placed on antibiotics.  She states that she has pain with that she is a diabetic with last A1c of 8.6%.  She denies any other acute complaints.   Review of Systems: Negative except as noted in the HPI. Denies N/V/F/Ch.  Past Medical History:  Diagnosis Date   Acid reflux    Angina pectoris, unspecified ([place])    Anxiety    Arthritis    Arthritis    Asthma    Cancer, colon ([place])    colon   Chest pain    Coronary artery disease    a. LAD PCI in 2005 b. cath in 2014 showed patent pLAD stent c. Cath 06/11/17 severe ISR of overlapping Cypher DES in pLAD s/p cutting ballon PTCA only; mild dLAD dz   Diabetes mellitus    Diabetic neuropathy (HCC)    GERD (gastroesophageal reflux disease)    Heart disease    History of skin cancer    Hypercholesteremia    Hypertension    Hypothyroidism    Myocardial infarction ([place]) 1998   Panic attack    PONV (postoperative nausea and vomiting)    Sleep apnea    Sleep apnea    Thyroid disease     Current Outpatient Medications:    ondansetron (ZOFRAN) 4 MG tablet, Take 1 tablet (4 mg total) by mouth every 8 (eight) hours as needed for nausea or vomiting., Disp: 20 tablet, Rfl: 0   sulfamethoxazole-trimethoprim (BACTRIM DS) 800-160 MG tablet, Take 1 tablet by mouth 2 (two) times daily., Disp: 20 tablet, Rfl: 0   ACCU-CHEK AVIVA PLUS test strip, USE TO CHECK BLOOD SUGAR TWO TIMES DAILY, Disp: , Rfl:    acyclovir (ZOVIRAX) 800 MG tablet, TAKE 1  TABLET BY MOUTH FIVE TIMES A DAY, Disp: , Rfl:    albuterol (PROVENTIL) (2.5 MG/3ML) 0.083% nebulizer solution, , Disp: , Rfl:    amoxicillin (AMOXIL) 500 MG tablet, Take 500 mg by mouth 3 (three) times daily., Disp: , Rfl:    amoxicillin-clavulanate (AUGMENTIN) 875-125 MG tablet, Take 1 tablet by mouth 2 (two) times daily., Disp: , Rfl:    aspirin EC 81 MG tablet, Take 81 mg by mouth daily., Disp: , Rfl:    B-D ULTRAFINE III SHORT PEN 31G X 8 MM MISC, See admin instructions., Disp: , Rfl:    budesonide-formoterol (SYMBICORT) 160-4.5 MCG/ACT inhaler, Inhale 2 puffs into the lungs 2 (two) times daily., Disp: , Rfl:    calcipotriene (DOVONOX) 0.005 % cream, Apply 1 application topically 2 (two) times daily., Disp: , Rfl:    Cholecalciferol (VITAMIN D3) 1.25 MG (50000 UT) CAPS, Take 1 capsule by mouth once a week., Disp: , Rfl:    cyclobenzaprine (FLEXERIL) 10 MG tablet, Take 10 mg by mouth 3 (three) times daily as needed for muscle spasms., Disp: , Rfl:    diazepam (VALIUM) 5 MG tablet, Take 5 mg by mouth 2 (two) times daily., Disp: , Rfl:  diclofenac sodium (VOLTAREN) 1 % GEL, Apply 2-4 g topically 4 (four) times daily as needed (for pain.). , Disp: , Rfl: 0   doxycycline (VIBRA-TABS) 100 MG tablet, Take 1 tablet (100 mg total) by mouth 2 (two) times daily., Disp: 20 tablet, Rfl: 0   DULoxetine (CYMBALTA) 20 MG capsule, Take 2 capsules (40 mg total) by mouth daily., Disp: 60 capsule, Rfl: 0   Empagliflozin-metFORMIN HCl ER (SYNJARDY XR) 12.06-998 MG TB24, Take by mouth., Disp: , Rfl:    estradiol (ESTRACE) 0.1 MG/GM vaginal cream, , Disp: , Rfl:    fluconazole (DIFLUCAN) 150 MG tablet, Take by mouth., Disp: , Rfl:    fluticasone (FLONASE) 50 MCG/ACT nasal spray, Place 1 spray into both nostrils 2 (two) times daily., Disp: , Rfl:    furosemide (LASIX) 40 MG tablet, Take 40 mg by mouth daily., Disp: , Rfl:    gabapentin (NEURONTIN) 300 MG capsule, Take 300 mg by mouth 3 (three) times daily., Disp:  , Rfl:    HUMALOG KWIKPEN 100 UNIT/ML KwikPen, INJECT 10 20 UNITS 3 TIMES A DAY BEFORE MEALS SUBCUTANEOUSLY, Disp: , Rfl:    hydrOXYzine (ATARAX/VISTARIL) 25 MG tablet, Take 25 mg by mouth 2 (two) times daily as needed for itching. , Disp: , Rfl: 5   hydrOXYzine (VISTARIL) 25 MG capsule, TAKE 1 TO 2 CAPSULE BY MOUTH THREE TIMES DAILY, AS NEEDED, FOR ITHCING, Disp: , Rfl:    hydrOXYzine (VISTARIL) 25 MG capsule, Take 25-50 mg by mouth 3 (three) times daily as needed., Disp: , Rfl:    insulin degludec (TRESIBA) 100 UNIT/ML FlexTouch Pen, Inject 50 Units into the skin daily., Disp: , Rfl:    Insulin Pen Needle (B-D ULTRAFINE III SHORT PEN) 31G X 8 MM MISC, Use as directed., Disp: , Rfl:    isosorbide mononitrate (IMDUR) 60 MG 24 hr tablet, Take 60 mg by mouth daily., Disp: , Rfl:    ketoconazole (NIZORAL) 2 % shampoo, Apply topically., Disp: , Rfl:    ketorolac (ACULAR) 0.5 % ophthalmic solution, as directed., Disp: , Rfl:    levothyroxine (SYNTHROID, LEVOTHROID) 125 MCG tablet, Take 150 mcg by mouth daily before breakfast. , Disp: , Rfl:    lidocaine (XYLOCAINE) 5 % ointment, , Disp: , Rfl:    losartan (COZAAR) 25 MG tablet, Take 1 tablet (25 mg total) by mouth daily. Please keep upcoming appt in May 2022 with Dr. Tamala Julian before anymore refills. Thank you Final Attempt, Disp: 30 tablet, Rfl: 2   metoprolol succinate (TOPROL-XL) 100 MG 24 hr tablet, Take 100 mg by mouth daily., Disp: , Rfl:    mirtazapine (REMERON) 30 MG tablet, Take 1 tablet (30 mg total) by mouth at bedtime., Disp: 30 tablet, Rfl: 0   montelukast (SINGULAIR) 10 MG tablet, Take 10 mg by mouth daily., Disp: , Rfl:    moxifloxacin (VIGAMOX) 0.5 % ophthalmic solution, Apply to eye as directed., Disp: , Rfl:    mupirocin ointment (BACTROBAN) 2 %, TOPICAL APPLY TO AFFECTED AREA EXTERNALLY TWICE A DAY AS DIRECTED, Disp: , Rfl:    NEOMYCIN-POLYMYXIN-HYDROCORTISONE (CORTISPORIN) 1 % SOLN OTIC solution, APPLY 1-2 DROPS TO TOE AFTER SOAKING  TWICE A DAY, Disp: 10 mL, Rfl: 0   nitrofurantoin (MACRODANTIN) 50 MG capsule, Take 1 capsule (50 mg total) by mouth at bedtime., Disp: 90 capsule, Rfl: 3   nitrofurantoin, macrocrystal-monohydrate, (MACROBID) 100 MG capsule, Take 1 capsule (100 mg total) by mouth every 12 (twelve) hours., Disp: 14 capsule, Rfl: 0   nitroGLYCERIN (NITROSTAT)  0.4 MG SL tablet, PLACE 1 TABLET (0.4 MG TOTAL) UNDER THE TONGUE EVERY 5 (FIVE) MINUTES AS NEEDED FOR CHEST PAIN., Disp: 25 tablet, Rfl: 3   Omega-3 Fatty Acids (FISH OIL) 1000 MG CAPS, Take by mouth., Disp: , Rfl:    Oxycodone HCl 10 MG TABS, Take 10 mg by mouth 3 (three) times daily., Disp: , Rfl:    pantoprazole (PROTONIX) 40 MG tablet, Take 1 tablet (40 mg total) by mouth daily. Please make yearly appt with Dr. Tamala Julian for November before anymore refills. 1st attempt, Disp: 90 tablet, Rfl: 0   potassium chloride SA (K-DUR,KLOR-CON) 20 MEQ tablet, Take 20 mEq by mouth daily., Disp: , Rfl:    prasugrel (EFFIENT) 10 MG TABS tablet, TAKE 1 TABLET BY MOUTH EVERY DAY, Disp: 90 tablet, Rfl: 3   prednisoLONE acetate (PRED FORTE) 1 % ophthalmic suspension, as directed., Disp: , Rfl:    promethazine (PHENERGAN) 25 MG tablet, Take 25 mg by mouth every 6 (six) hours as needed., Disp: , Rfl:    rosuvastatin (CRESTOR) 40 MG tablet, Take by mouth., Disp: , Rfl:    sertraline (ZOLOFT) 50 MG tablet, Take 50 mg by mouth daily., Disp: , Rfl:    SYNJARDY XR 12.06-998 MG TB24, Take 1 tablet by mouth 2 (two) times daily., Disp: , Rfl:    tiotropium (SPIRIVA) 18 MCG inhalation capsule, Place 18 mcg into inhaler and inhale daily., Disp: , Rfl:   Social History   Tobacco Use  Smoking Status Never  Smokeless Tobacco Never    Allergies  Allergen Reactions   Tape Other (See Comments)    Adhesive breaks me out (rash)   Exenatide     Other reaction(s): Unknown   Iodinated Diagnostic Agents     Other reaction(s): Unknown   Ticagrelor     Other reaction(s): Unknown   Morphine  Itching   Objective:  There were no vitals filed for this visit. There is no height or weight on file to calculate BMI. Constitutional Well developed. Well nourished.  Vascular Dorsalis pedis pulses palpable bilaterally. Posterior tibial pulses palpable bilaterally. Capillary refill normal to all digits.  No cyanosis or clubbing noted. Pedal hair growth normal.  Neurologic Normal speech. Oriented to person, place, and time. Epicritic sensation to light touch grossly present bilaterally.  Dermatologic Nails right second digit paronychia without any purulent drainage.  Redness noted up to the DIPJ joint.  No malodor present.  Ingrown site is clean. Skin normal limits slightly macerated  Orthopedic: Normal joint ROM without pain or crepitus bilaterally. No visible deformities. No bony tenderness.   Radiographs: None Assessment:   1. Paronychia of second toe of right foot   2. Nausea    Plan:  Patient was evaluated and treated and all questions answered.  Right second digit paronychia -I explained the patient the etiology of paronychia versus treatment options were discussed.  Given that she is a diabetic with last A1c of 8.6% she is at high risk of developing wound complication.  I discussed with the patient that we will treat this like a wound where she would do Betadine wet-to-dry dressing changes daily.  She will wear surgical shoe which surgical shoe was dispensed.  I have also discontinued doxycycline and placed her on slightly more broad-spectrum with Bactrim.  I encouraged her to take Bactrim twice a day she states understanding will do so.  She also gets nausea when taking antibiotics therefore Zofran was given for antinausea.   No follow-ups on file.

## 2020-08-21 ENCOUNTER — Encounter: Payer: Self-pay | Admitting: Podiatry

## 2020-08-21 ENCOUNTER — Ambulatory Visit (INDEPENDENT_AMBULATORY_CARE_PROVIDER_SITE_OTHER): Payer: Medicare Other | Admitting: Podiatry

## 2020-08-21 ENCOUNTER — Other Ambulatory Visit: Payer: Self-pay

## 2020-08-21 DIAGNOSIS — L600 Ingrowing nail: Secondary | ICD-10-CM

## 2020-08-21 NOTE — Patient Instructions (Signed)

## 2020-08-22 NOTE — Progress Notes (Signed)
Subjective:   Patient ID: [name], female   DOB: 65 y.o.   MRN: [id number]   HPI Patient states the right second nail needed to be checked and now she is developing an ingrown on the third nail which is becoming painful   ROS      Objective:  Physical Exam  Neurovascular status intact with second nail that is healing well with previous antibiotic which is salt any type of low-grade local infection which may have occurred.  Incurvated third nail medial border painful     Assessment:  Doing well from right second nail with ingrown toenail third nail right medial border     Plan:  Reviewed condition and second nail I think is well and discussed correction of third allowing her to sign consent form permanent procedure.  I infiltrated 60 mg like Marcaine mixture sterile prep done using sterile instrumentation removed the third nail border and exposed matrix applied phenol 3 applications 30 seconds followed by alcohol lavage sterile dressing and gave instructions on soaks.  Patient will be seen back to recheck

## 2020-09-09 ENCOUNTER — Ambulatory Visit (INDEPENDENT_AMBULATORY_CARE_PROVIDER_SITE_OTHER): Payer: Medicare Other | Admitting: Urology

## 2020-09-09 ENCOUNTER — Encounter: Payer: Self-pay | Admitting: Urology

## 2020-09-09 ENCOUNTER — Other Ambulatory Visit: Payer: Self-pay

## 2020-09-09 VITALS — BP 160/76 | HR 99

## 2020-09-09 DIAGNOSIS — R350 Frequency of micturition: Secondary | ICD-10-CM | POA: Diagnosis not present

## 2020-09-09 DIAGNOSIS — N3021 Other chronic cystitis with hematuria: Secondary | ICD-10-CM

## 2020-09-09 DIAGNOSIS — N3281 Overactive bladder: Secondary | ICD-10-CM | POA: Diagnosis not present

## 2020-09-09 LAB — URINALYSIS, ROUTINE W REFLEX MICROSCOPIC
Bilirubin, UA: NEGATIVE
Ketones, UA: NEGATIVE
Leukocytes,UA: NEGATIVE
Nitrite, UA: NEGATIVE
Protein,UA: NEGATIVE
Specific Gravity, UA: 1.025 (ref 1.005–1.030)
Urobilinogen, Ur: 0.2 mg/dL (ref 0.2–1.0)
pH, UA: 5.5 (ref 5.0–7.5)

## 2020-09-09 LAB — MICROSCOPIC EXAMINATION: Renal Epithel, UA: NONE SEEN /hpf

## 2020-09-09 MED ORDER — NITROFURANTOIN MACROCRYSTAL 50 MG PO CAPS: 50.0000 mg | ORAL_CAPSULE | Freq: Every day | ORAL | 3 refills | Status: DC

## 2020-09-09 MED ORDER — MIRABEGRON ER 25 MG PO TB24: 25.0000 mg | ORAL_TABLET | Freq: Every day | ORAL | 0 refills | Status: DC

## 2020-09-09 NOTE — Patient Instructions (Signed)
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
## 2020-09-09 NOTE — Progress Notes (Signed)
Urological Symptom Review  Patient is experiencing the following symptoms: Frequent urination Hard to postpone urination Get up at night to urinate Leakage of urine Stream starts and stops Blood in urine Urinary tract infection Vaginal bleeding (female only)   Review of Systems  Gastrointestinal (upper)  : Negative for upper GI symptoms  Gastrointestinal (lower) : Negative for lower GI symptoms  Constitutional : Night Sweats Fatigue  Skin: Negative for skin symptoms  Eyes: Negative for eye symptoms  Ear/Nose/Throat : Sore throat Sinus problems  Hematologic/Lymphatic: Negative for Hematologic/Lymphatic symptoms  Cardiovascular : Negative for cardiovascular symptoms  Respiratory : Negative for respiratory symptoms  Endocrine: Excessive thirst  Musculoskeletal: Back pain  Neurological: Negative for neurological symptoms  Psychologic: Negative for psychiatric symptoms

## 2020-09-11 LAB — URINE CULTURE
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
## 2020-09-27 ENCOUNTER — Telehealth: Payer: Self-pay

## 2020-09-27 NOTE — Telephone Encounter (Signed)
Patient needing the 50 mg called into her pharmacy.   mirabegron ER (MYRBETRIQ) TB24 tablet She says the 50 mg works best for her.  [facility]  [id number] [place] [place]  [phone] Fax:  [phone]    Thanks, THelene Kelp

## 2020-09-30 NOTE — Telephone Encounter (Signed)
Working remotely from home today, attempted to call patient back to discuss '50mg'$  as Dr. Alyson Ingles gave '25mg'$  samples in office.  Patient home phone will not receive calls from laptop phone.

## 2020-10-01 ENCOUNTER — Ambulatory Visit: Payer: Medicare Other | Admitting: Physician Assistant

## 2020-10-01 NOTE — Telephone Encounter (Signed)
Called patient today. No answer. Left message to return call to office.

## 2020-10-09 ENCOUNTER — Other Ambulatory Visit: Payer: Self-pay

## 2020-10-09 MED ORDER — MIRABEGRON ER 50 MG PO TB24: 50.0000 mg | ORAL_TABLET | Freq: Every day | ORAL | Status: DC

## 2020-10-31 ENCOUNTER — Encounter (INDEPENDENT_AMBULATORY_CARE_PROVIDER_SITE_OTHER): Payer: Self-pay | Admitting: *Deleted

## 2020-12-06 ENCOUNTER — Telehealth: Payer: Self-pay | Admitting: Interventional Cardiology

## 2020-12-06 NOTE — Telephone Encounter (Signed)
Pt has been having intermittent CP x 2 weeks.  Happens everyday, multiple times a day.  Each episode lasts about 10 mins and then resolves.  Has taken 10-15 nitro over the last two weeks with no relief.  States the pain feels like it did when her stent was clotted off in 2019 and had to be reopened.  Pain radiates to her left arm, left breast area, left armpit and into her back.  BP is always normal.  Last check was 132/75.  Last episode was this morning.  Spoke with Dr. Marlou Porch, DOD and he said ok to schedule pt to be seen on Monday with DOD since there are no other slots.  Scheduled pt to see Dr. Angelena Form on Monday and advised her if pain worsens or does not resolve quickly, head to the ER for eval.  Pt verbalized understanding and was in agreement with plan.

## 2020-12-06 NOTE — Telephone Encounter (Signed)
Pt c/o of Chest Pain: STAT if CP now or developed within 24 hours  1. Are you having CP right now? no  2. Are you experiencing any other symptoms (ex. SOB, nausea, vomiting, sweating)? SOB, sweating, nauseated  3. How long have you been experiencing CP? About two weeks  4. Is your CP continuous or coming and going? Comes and goes   5. Have you taken Nitroglycerin? Yes and it didn't help  Patient said her symptoms feel similar to when her stent got clogged and they had to be done again.
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
## 2020-12-09 ENCOUNTER — Inpatient Hospital Stay (HOSPITAL_COMMUNITY)
Admission: EM | Admit: 2020-12-09 | Discharge: 2020-12-11 | DRG: 247 | Disposition: A | Discharge status: Home / Self Care | Payer: Medicare Other | Attending: Cardiovascular Disease | Admitting: Cardiovascular Disease

## 2020-12-09 ENCOUNTER — Other Ambulatory Visit: Payer: Self-pay

## 2020-12-09 ENCOUNTER — Encounter: Payer: Self-pay | Admitting: Cardiovascular Disease

## 2020-12-09 ENCOUNTER — Ambulatory Visit (INDEPENDENT_AMBULATORY_CARE_PROVIDER_SITE_OTHER): Payer: Medicare Other | Admitting: Cardiovascular Disease

## 2020-12-09 VITALS — BP 136/68 | HR 65 | Ht 64.0 in | Wt 228.4 lb

## 2020-12-09 DIAGNOSIS — Z90710 Acquired absence of both cervix and uterus: Secondary | ICD-10-CM

## 2020-12-09 DIAGNOSIS — Z79899 Other long term (current) drug therapy: Secondary | ICD-10-CM

## 2020-12-09 DIAGNOSIS — E1140 Type 2 diabetes mellitus with diabetic neuropathy, unspecified: Secondary | ICD-10-CM | POA: Diagnosis present

## 2020-12-09 DIAGNOSIS — E039 Hypothyroidism, unspecified: Secondary | ICD-10-CM | POA: Diagnosis present

## 2020-12-09 DIAGNOSIS — E785 Hyperlipidemia, unspecified: Secondary | ICD-10-CM | POA: Diagnosis present

## 2020-12-09 DIAGNOSIS — I200 Unstable angina: Secondary | ICD-10-CM | POA: Diagnosis not present

## 2020-12-09 DIAGNOSIS — I25110 Atherosclerotic heart disease of native coronary artery with unstable angina pectoris: Principal | ICD-10-CM | POA: Diagnosis present

## 2020-12-09 DIAGNOSIS — Z7982 Long term (current) use of aspirin: Secondary | ICD-10-CM

## 2020-12-09 DIAGNOSIS — K219 Gastro-esophageal reflux disease without esophagitis: Secondary | ICD-10-CM | POA: Diagnosis present

## 2020-12-09 DIAGNOSIS — Z79890 Hormone replacement therapy (postmenopausal): Secondary | ICD-10-CM

## 2020-12-09 DIAGNOSIS — I10 Essential (primary) hypertension: Secondary | ICD-10-CM | POA: Diagnosis present

## 2020-12-09 DIAGNOSIS — Z85038 Personal history of other malignant neoplasm of large intestine: Secondary | ICD-10-CM

## 2020-12-09 DIAGNOSIS — Z794 Long term (current) use of insulin: Secondary | ICD-10-CM

## 2020-12-09 DIAGNOSIS — F410 Panic disorder [episodic paroxysmal anxiety] without agoraphobia: Secondary | ICD-10-CM | POA: Diagnosis present

## 2020-12-09 DIAGNOSIS — Z20822 Contact with and (suspected) exposure to covid-19: Secondary | ICD-10-CM | POA: Diagnosis present

## 2020-12-09 DIAGNOSIS — Z7984 Long term (current) use of oral hypoglycemic drugs: Secondary | ICD-10-CM

## 2020-12-09 DIAGNOSIS — Z7951 Long term (current) use of inhaled steroids: Secondary | ICD-10-CM

## 2020-12-09 DIAGNOSIS — E782 Mixed hyperlipidemia: Secondary | ICD-10-CM | POA: Diagnosis present

## 2020-12-09 DIAGNOSIS — Z85828 Personal history of other malignant neoplasm of skin: Secondary | ICD-10-CM

## 2020-12-09 DIAGNOSIS — I252 Old myocardial infarction: Secondary | ICD-10-CM

## 2020-12-09 DIAGNOSIS — Z8249 Family history of ischemic heart disease and other diseases of the circulatory system: Secondary | ICD-10-CM

## 2020-12-09 DIAGNOSIS — Z955 Presence of coronary angioplasty implant and graft: Secondary | ICD-10-CM

## 2020-12-09 LAB — CBC
HCT: 36.3 % (ref 36.0–46.0)
HCT: 36.8 % (ref 36.0–46.0)
Hemoglobin: 12 g/dL (ref 12.0–15.0)
Hemoglobin: 12 g/dL (ref 12.0–15.0)
MCH: 30.7 pg (ref 26.0–34.0)
MCH: 30.6 pg (ref 26.0–34.0)
MCHC: 33.1 g/dL (ref 30.0–36.0)
MCHC: 32.6 g/dL (ref 30.0–36.0)
MCV: 92.8 fL (ref 80.0–100.0)
MCV: 93.9 fL (ref 80.0–100.0)
Platelets: 133 10*3/uL — ABNORMAL LOW (ref 150–400)
Platelets: 143 10*3/uL — ABNORMAL LOW (ref 150–400)
RBC: 3.91 MIL/uL (ref 3.87–5.11)
RBC: 3.92 MIL/uL (ref 3.87–5.11)
RDW: 13.2 % (ref 11.5–15.5)
RDW: 13.2 % (ref 11.5–15.5)
WBC: 6.7 10*3/uL (ref 4.0–10.5)
WBC: 6.3 10*3/uL (ref 4.0–10.5)
nRBC: 0 % (ref 0.0–0.2)
nRBC: 0 % (ref 0.0–0.2)

## 2020-12-09 LAB — BASIC METABOLIC PANEL
Anion gap: 9 (ref 5–15)
BUN: 25 mg/dL — ABNORMAL HIGH (ref 8–23)
CO2: 25 mmol/L (ref 22–32)
Calcium: 8.5 mg/dL — ABNORMAL LOW (ref 8.9–10.3)
Chloride: 104 mmol/L (ref 98–111)
Creatinine, Ser: 1.19 mg/dL — ABNORMAL HIGH (ref 0.44–1.00)
GFR, Estimated: 51 mL/min — ABNORMAL LOW (ref >60)
Glucose, Bld: 104 mg/dL — ABNORMAL HIGH (ref 70–99)
Potassium: 3.9 mmol/L (ref 3.5–5.1)
Sodium: 138 mmol/L (ref 135–145)

## 2020-12-09 LAB — CREATININE, SERUM
Creatinine, Ser: 1.19 mg/dL — ABNORMAL HIGH (ref 0.44–1.00)
GFR, Estimated: 51 mL/min — ABNORMAL LOW (ref >60)

## 2020-12-09 LAB — HEMOGLOBIN A1C
Hgb A1c MFr Bld: 7.1 % — ABNORMAL HIGH (ref 4.8–5.6)
Mean Plasma Glucose: 157.07 mg/dL

## 2020-12-09 LAB — TROPONIN I (HIGH SENSITIVITY)
Troponin I (High Sensitivity): 6 ng/L (ref <18)
Troponin I (High Sensitivity): 5 ng/L (ref <18)
Troponin I (High Sensitivity): 5 ng/L (ref <18)

## 2020-12-09 LAB — GLUCOSE, CAPILLARY
Glucose-Capillary: 109 mg/dL — ABNORMAL HIGH (ref 70–99)
Glucose-Capillary: 74 mg/dL (ref 70–99)
Glucose-Capillary: 105 mg/dL — ABNORMAL HIGH (ref 70–99)
Glucose-Capillary: 153 mg/dL — ABNORMAL HIGH (ref 70–99)

## 2020-12-09 LAB — MRSA NEXT GEN BY PCR, NASAL: MRSA by PCR Next Gen: NOT DETECTED

## 2020-12-09 LAB — HIV ANTIBODY (ROUTINE TESTING W REFLEX): HIV Screen 4th Generation wRfx: NONREACTIVE

## 2020-12-09 LAB — SARS CORONAVIRUS 2 BY RT PCR (HOSPITAL ORDER, PERFORMED IN ~~LOC~~ HOSPITAL LAB): SARS Coronavirus 2: NEGATIVE

## 2020-12-09 MED ORDER — METOPROLOL SUCCINATE ER 100 MG PO TB24
100.0000 mg | ORAL_TABLET | Freq: Every day | ORAL | Status: DC
  Administered 2020-12-09: 100 mg via ORAL
  Filled 2020-12-09: qty 1

## 2020-12-09 MED ORDER — INSULIN ASPART 100 UNIT/ML IJ SOLN
0.0000 [IU] | Freq: Three times a day (TID) | INTRAMUSCULAR | Status: DC
  Administered 2020-12-10: 3 [IU] via SUBCUTANEOUS
  Administered 2020-12-10 – 2020-12-11 (×3): 5 [IU] via SUBCUTANEOUS

## 2020-12-09 MED ORDER — ACETAMINOPHEN 325 MG PO TABS
650.0000 mg | ORAL_TABLET | ORAL | Status: DC | PRN
  Administered 2020-12-09 – 2020-12-10 (×2): 650 mg via ORAL
  Filled 2020-12-09 (×2): qty 2

## 2020-12-09 MED ORDER — HEPARIN SODIUM (PORCINE) 5000 UNIT/ML IJ SOLN
5000.0000 [IU] | Freq: Three times a day (TID) | INTRAMUSCULAR | Status: DC
  Administered 2020-12-09 – 2020-12-11 (×3): 5000 [IU] via SUBCUTANEOUS
  Filled 2020-12-09 (×3): qty 1

## 2020-12-09 MED ORDER — SODIUM CHLORIDE 0.9 % IV SOLN: 250.0000 mL | INTRAVENOUS | Status: DC | PRN

## 2020-12-09 MED ORDER — ASPIRIN EC 81 MG PO TBEC: 81.0000 mg | DELAYED_RELEASE_TABLET | Freq: Every day | ORAL | Status: DC

## 2020-12-09 MED ORDER — SODIUM CHLORIDE 0.9 % WEIGHT BASED INFUSION
1.0000 mL/kg/h | INTRAVENOUS | Status: DC
  Administered 2020-12-10: 1 mL/kg/h via INTRAVENOUS

## 2020-12-09 MED ORDER — ONDANSETRON HCL 4 MG/2ML IJ SOLN
4.0000 mg | Freq: Four times a day (QID) | INTRAMUSCULAR | Status: DC | PRN
  Administered 2020-12-10 (×2): 4 mg via INTRAVENOUS
  Filled 2020-12-09 (×2): qty 2

## 2020-12-09 MED ORDER — PREDNISONE 50 MG PO TABS
50.0000 mg | ORAL_TABLET | Freq: Four times a day (QID) | ORAL | Status: AC
  Administered 2020-12-09 – 2020-12-10 (×3): 50 mg via ORAL
  Filled 2020-12-09 (×3): qty 1

## 2020-12-09 MED ORDER — ASPIRIN 81 MG PO CHEW
81.0000 mg | CHEWABLE_TABLET | ORAL | Status: AC
  Administered 2020-12-10: 81 mg via ORAL
  Filled 2020-12-09: qty 1

## 2020-12-09 MED ORDER — NITROGLYCERIN 0.4 MG SL SUBL: 0.4000 mg | SUBLINGUAL_TABLET | SUBLINGUAL | Status: DC | PRN

## 2020-12-09 MED ORDER — NITROFURANTOIN MACROCRYSTAL 50 MG PO CAPS: 50.0000 mg | ORAL_CAPSULE | Freq: Every day | ORAL | Status: DC

## 2020-12-09 MED ORDER — SODIUM CHLORIDE 0.9 % WEIGHT BASED INFUSION: 3.0000 mL/kg/h | INTRAVENOUS | Status: DC

## 2020-12-09 MED ORDER — SODIUM CHLORIDE 0.9% FLUSH
3.0000 mL | Freq: Two times a day (BID) | INTRAVENOUS | Status: DC
  Administered 2020-12-09 – 2020-12-11 (×4): 3 mL via INTRAVENOUS

## 2020-12-09 MED ORDER — DIPHENHYDRAMINE HCL 50 MG/ML IJ SOLN: 50.0000 mg | Freq: Once | INTRAMUSCULAR | Status: AC

## 2020-12-09 MED ORDER — DIPHENHYDRAMINE HCL 25 MG PO CAPS
50.0000 mg | ORAL_CAPSULE | Freq: Once | ORAL | Status: AC
  Administered 2020-12-10: 50 mg via ORAL
  Filled 2020-12-09: qty 2

## 2020-12-09 MED ORDER — SODIUM CHLORIDE 0.9% FLUSH: 3.0000 mL | INTRAVENOUS | Status: DC | PRN

## 2020-12-09 MED ORDER — ASPIRIN EC 81 MG PO TBEC
81.0000 mg | DELAYED_RELEASE_TABLET | Freq: Every day | ORAL | Status: DC
  Administered 2020-12-11: 81 mg via ORAL
  Filled 2020-12-09: qty 1

## 2020-12-09 MED ORDER — ROSUVASTATIN CALCIUM 20 MG PO TABS
40.0000 mg | ORAL_TABLET | Freq: Every day | ORAL | Status: DC
  Administered 2020-12-09 – 2020-12-11 (×2): 40 mg via ORAL
  Filled 2020-12-09 (×3): qty 2

## 2020-12-09 NOTE — Plan of Care (Signed)
  Problem: Education: Goal: Knowledge of General Education information will improve Description: Including pain rating scale, medication(s)/side effects and non-pharmacologic comfort measures Outcome: Progressing   Problem: Health Behavior/Discharge Planning: Goal: Ability to manage health-related needs will improve Outcome: Progressing   Problem: Clinical Measurements: Goal: Ability to maintain clinical measurements within normal limits will improve Outcome: Progressing Goal: Diagnostic test results will improve Outcome: Progressing   Problem: Activity: Goal: Risk for activity intolerance will decrease Outcome: Progressing   Problem: Pain Managment: Goal: General experience of comfort will improve Outcome: Progressing   
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
## 2020-12-09 NOTE — Interval H&P Note (Signed)
History and Physical Interval Note:  12/09/2020 6:18 PM  [name]  has presented today for surgery, with the diagnosis of unstable angina.  The various methods of treatment have been discussed with the patient and family. After consideration of risks, benefits and other options for treatment, the patient has consented to  Procedure(s): LEFT HEART CATH AND CORONARY ANGIOGRAPHY (N/A) as a surgical intervention.  The patient's history has been reviewed, patient examined, no change in status, stable for surgery.  I have reviewed the patient's chart and labs.  Questions were answered to the patient's satisfaction.    Cath Lab Visit (complete for each Cath Lab visit)  Clinical Evaluation Leading to the Procedure:   ACS: No.  Non-ACS:    Anginal Classification: CCS III  Anti-ischemic medical therapy: Maximal Therapy (2 or more classes of medications)  Non-Invasive Test Results: No non-invasive testing performed  Prior CABG: No previous CABG        Early [place]

## 2020-12-09 NOTE — Patient Instructions (Signed)
Dr. Angelena Form has recommended you go to the hospital by EMS for further evaluation of your chest pain.

## 2020-12-10 ENCOUNTER — Encounter (HOSPITAL_COMMUNITY): Payer: Self-pay | Admitting: Internal Medicine

## 2020-12-10 ENCOUNTER — Encounter (HOSPITAL_COMMUNITY)
Admission: EM | Disposition: A | Discharge status: Home / Self Care | Payer: Self-pay | Source: Home / Self Care | Attending: Cardiovascular Disease

## 2020-12-10 DIAGNOSIS — I200 Unstable angina: Secondary | ICD-10-CM | POA: Diagnosis not present

## 2020-12-10 DIAGNOSIS — I10 Essential (primary) hypertension: Secondary | ICD-10-CM | POA: Diagnosis present

## 2020-12-10 DIAGNOSIS — E1140 Type 2 diabetes mellitus with diabetic neuropathy, unspecified: Secondary | ICD-10-CM | POA: Diagnosis present

## 2020-12-10 DIAGNOSIS — Z8249 Family history of ischemic heart disease and other diseases of the circulatory system: Secondary | ICD-10-CM | POA: Diagnosis not present

## 2020-12-10 DIAGNOSIS — Z85828 Personal history of other malignant neoplasm of skin: Secondary | ICD-10-CM | POA: Diagnosis not present

## 2020-12-10 DIAGNOSIS — Z794 Long term (current) use of insulin: Secondary | ICD-10-CM | POA: Diagnosis not present

## 2020-12-10 DIAGNOSIS — Z7984 Long term (current) use of oral hypoglycemic drugs: Secondary | ICD-10-CM | POA: Diagnosis not present

## 2020-12-10 DIAGNOSIS — Z7982 Long term (current) use of aspirin: Secondary | ICD-10-CM | POA: Diagnosis not present

## 2020-12-10 DIAGNOSIS — I25110 Atherosclerotic heart disease of native coronary artery with unstable angina pectoris: Principal | ICD-10-CM

## 2020-12-10 DIAGNOSIS — Z955 Presence of coronary angioplasty implant and graft: Secondary | ICD-10-CM

## 2020-12-10 DIAGNOSIS — K219 Gastro-esophageal reflux disease without esophagitis: Secondary | ICD-10-CM | POA: Diagnosis present

## 2020-12-10 DIAGNOSIS — Z85038 Personal history of other malignant neoplasm of large intestine: Secondary | ICD-10-CM | POA: Diagnosis not present

## 2020-12-10 DIAGNOSIS — E039 Hypothyroidism, unspecified: Secondary | ICD-10-CM | POA: Diagnosis present

## 2020-12-10 DIAGNOSIS — Z79890 Hormone replacement therapy (postmenopausal): Secondary | ICD-10-CM | POA: Diagnosis not present

## 2020-12-10 DIAGNOSIS — Z79899 Other long term (current) drug therapy: Secondary | ICD-10-CM | POA: Diagnosis not present

## 2020-12-10 DIAGNOSIS — Z7951 Long term (current) use of inhaled steroids: Secondary | ICD-10-CM | POA: Diagnosis not present

## 2020-12-10 DIAGNOSIS — E782 Mixed hyperlipidemia: Secondary | ICD-10-CM | POA: Diagnosis present

## 2020-12-10 DIAGNOSIS — Z90710 Acquired absence of both cervix and uterus: Secondary | ICD-10-CM | POA: Diagnosis not present

## 2020-12-10 DIAGNOSIS — F410 Panic disorder [episodic paroxysmal anxiety] without agoraphobia: Secondary | ICD-10-CM | POA: Diagnosis present

## 2020-12-10 DIAGNOSIS — Z20822 Contact with and (suspected) exposure to covid-19: Secondary | ICD-10-CM | POA: Diagnosis present

## 2020-12-10 DIAGNOSIS — I252 Old myocardial infarction: Secondary | ICD-10-CM | POA: Diagnosis not present

## 2020-12-10 HISTORY — PX: LEFT HEART CATH AND CORONARY ANGIOGRAPHY: CATH118249

## 2020-12-10 HISTORY — PX: CORONARY STENT INTERVENTION: CATH118234

## 2020-12-10 LAB — LIPID PANEL
Cholesterol: 207 mg/dL — ABNORMAL HIGH (ref 0–200)
HDL: 52 mg/dL (ref >40)
LDL Cholesterol: 108 mg/dL — ABNORMAL HIGH (ref 0–99)
Total CHOL/HDL Ratio: 4 RATIO
Triglycerides: 233 mg/dL — ABNORMAL HIGH (ref <150)
VLDL: 47 mg/dL — ABNORMAL HIGH (ref 0–40)

## 2020-12-10 LAB — BASIC METABOLIC PANEL
Anion gap: 8 (ref 5–15)
BUN: 23 mg/dL (ref 8–23)
CO2: 26 mmol/L (ref 22–32)
Calcium: 8.8 mg/dL — ABNORMAL LOW (ref 8.9–10.3)
Chloride: 103 mmol/L (ref 98–111)
Creatinine, Ser: 1.19 mg/dL — ABNORMAL HIGH (ref 0.44–1.00)
GFR, Estimated: 51 mL/min — ABNORMAL LOW (ref >60)
Glucose, Bld: 181 mg/dL — ABNORMAL HIGH (ref 70–99)
Potassium: 4.9 mmol/L (ref 3.5–5.1)
Sodium: 137 mmol/L (ref 135–145)

## 2020-12-10 LAB — CBC
HCT: 37.3 % (ref 36.0–46.0)
Hemoglobin: 12.3 g/dL (ref 12.0–15.0)
MCH: 30.5 pg (ref 26.0–34.0)
MCHC: 33 g/dL (ref 30.0–36.0)
MCV: 92.6 fL (ref 80.0–100.0)
Platelets: 112 10*3/uL — ABNORMAL LOW (ref 150–400)
RBC: 4.03 MIL/uL (ref 3.87–5.11)
RDW: 12.9 % (ref 11.5–15.5)
WBC: 6.1 10*3/uL (ref 4.0–10.5)
nRBC: 0 % (ref 0.0–0.2)

## 2020-12-10 LAB — GLUCOSE, CAPILLARY
Glucose-Capillary: 187 mg/dL — ABNORMAL HIGH (ref 70–99)
Glucose-Capillary: 244 mg/dL — ABNORMAL HIGH (ref 70–99)
Glucose-Capillary: 227 mg/dL — ABNORMAL HIGH (ref 70–99)
Glucose-Capillary: 263 mg/dL — ABNORMAL HIGH (ref 70–99)

## 2020-12-10 LAB — POCT ACTIVATED CLOTTING TIME
Activated Clotting Time: 265 seconds
Activated Clotting Time: 306 seconds

## 2020-12-10 SURGERY — LEFT HEART CATH AND CORONARY ANGIOGRAPHY
Anesthesia: LOCAL

## 2020-12-10 MED ORDER — SODIUM CHLORIDE 0.9 % IV SOLN: INTRAVENOUS | Status: AC

## 2020-12-10 MED ORDER — HEPARIN SODIUM (PORCINE) 1000 UNIT/ML IJ SOLN
INTRAMUSCULAR | Status: AC
  Filled 2020-12-10: qty 1

## 2020-12-10 MED ORDER — SODIUM CHLORIDE 0.9% FLUSH: 3.0000 mL | INTRAVENOUS | Status: DC | PRN

## 2020-12-10 MED ORDER — MIDAZOLAM HCL 2 MG/2ML IJ SOLN
INTRAMUSCULAR | Status: DC | PRN
  Administered 2020-12-10 (×3): 1 mg via INTRAVENOUS

## 2020-12-10 MED ORDER — HEPARIN (PORCINE) IN NACL 1000-0.9 UT/500ML-% IV SOLN
INTRAVENOUS | Status: AC
  Filled 2020-12-10: qty 1000

## 2020-12-10 MED ORDER — TICAGRELOR 90 MG PO TABS: ORAL_TABLET | ORAL | Status: DC | PRN

## 2020-12-10 MED ORDER — SODIUM CHLORIDE 0.9 % IV SOLN: 250.0000 mL | INTRAVENOUS | Status: DC | PRN

## 2020-12-10 MED ORDER — ACETAMINOPHEN 325 MG PO TABS: 650.0000 mg | ORAL_TABLET | ORAL | Status: DC | PRN

## 2020-12-10 MED ORDER — LABETALOL HCL 5 MG/ML IV SOLN: 10.0000 mg | INTRAVENOUS | Status: AC | PRN

## 2020-12-10 MED ORDER — PRASUGREL HCL 10 MG PO TABS
ORAL_TABLET | ORAL | Status: AC
  Filled 2020-12-10: qty 1

## 2020-12-10 MED ORDER — MIDAZOLAM HCL 2 MG/2ML IJ SOLN
INTRAMUSCULAR | Status: AC
  Filled 2020-12-10: qty 2

## 2020-12-10 MED ORDER — VERAPAMIL HCL 2.5 MG/ML IV SOLN
INTRAVENOUS | Status: AC
  Filled 2020-12-10: qty 2

## 2020-12-10 MED ORDER — PRASUGREL HCL 10 MG PO TABS
ORAL_TABLET | ORAL | Status: DC | PRN
  Administered 2020-12-10: 10 mg via ORAL

## 2020-12-10 MED ORDER — HEPARIN (PORCINE) IN NACL 1000-0.9 UT/500ML-% IV SOLN
INTRAVENOUS | Status: DC | PRN
  Administered 2020-12-10 (×2): 500 mL

## 2020-12-10 MED ORDER — VERAPAMIL HCL 2.5 MG/ML IV SOLN
INTRAVENOUS | Status: DC | PRN
  Administered 2020-12-10: 5 mL via INTRA_ARTERIAL

## 2020-12-10 MED ORDER — LIDOCAINE HCL (PF) 1 % IJ SOLN
INTRAMUSCULAR | Status: DC | PRN
  Administered 2020-12-10: 2 mL via INTRADERMAL

## 2020-12-10 MED ORDER — HEPARIN SODIUM (PORCINE) 1000 UNIT/ML IJ SOLN
INTRAMUSCULAR | Status: DC | PRN
  Administered 2020-12-10: 5000 [IU] via INTRAVENOUS
  Administered 2020-12-10: 3000 [IU] via INTRAVENOUS
  Administered 2020-12-10: 6000 [IU] via INTRAVENOUS

## 2020-12-10 MED ORDER — LIDOCAINE HCL (PF) 1 % IJ SOLN
INTRAMUSCULAR | Status: AC
  Filled 2020-12-10: qty 30

## 2020-12-10 MED ORDER — HYDRALAZINE HCL 20 MG/ML IJ SOLN: 10.0000 mg | INTRAMUSCULAR | Status: AC | PRN

## 2020-12-10 MED ORDER — FENTANYL CITRATE (PF) 100 MCG/2ML IJ SOLN
INTRAMUSCULAR | Status: DC | PRN
  Administered 2020-12-10 (×3): 25 ug via INTRAVENOUS

## 2020-12-10 MED ORDER — IOHEXOL 350 MG/ML SOLN
INTRAVENOUS | Status: DC | PRN
  Administered 2020-12-10: 150 mL

## 2020-12-10 MED ORDER — FENTANYL CITRATE (PF) 100 MCG/2ML IJ SOLN
INTRAMUSCULAR | Status: AC
  Filled 2020-12-10: qty 2

## 2020-12-10 MED ORDER — PRASUGREL HCL 10 MG PO TABS
10.0000 mg | ORAL_TABLET | Freq: Every day | ORAL | Status: DC
  Administered 2020-12-11: 10 mg via ORAL
  Filled 2020-12-10: qty 1

## 2020-12-10 MED ORDER — SODIUM CHLORIDE 0.9% FLUSH
3.0000 mL | Freq: Two times a day (BID) | INTRAVENOUS | Status: DC
  Administered 2020-12-10 – 2020-12-11 (×3): 3 mL via INTRAVENOUS

## 2020-12-10 MED ORDER — ONDANSETRON HCL 4 MG/2ML IJ SOLN: 4.0000 mg | Freq: Four times a day (QID) | INTRAMUSCULAR | Status: DC | PRN

## 2020-12-10 SURGICAL SUPPLY — 18 items
BALLN SAPPHIRE 2.5X12 (BALLOONS) ×3
BALLN SAPPHIRE ~~LOC~~ 2.5X12 (BALLOONS) ×2 IMPLANT
BALLN SAPPHIRE ~~LOC~~ 2.75X12 (BALLOONS) ×2 IMPLANT
BALLOON SAPPHIRE 2.5X12 (BALLOONS) IMPLANT
CATH DIAG 6FR JL4 (CATHETERS) ×2 IMPLANT
CATH DIAG 6FR JR4 (CATHETERS) ×2 IMPLANT
CATH DIAG 6FR PIGTAIL ANGLED (CATHETERS) ×2 IMPLANT
CATH LAUNCHER 6FR EBU3.5 (CATHETERS) ×2 IMPLANT
DEVICE RAD COMP TR BAND LRG (VASCULAR PRODUCTS) ×3 IMPLANT
GLIDESHEATH SLEND SS 6F .021 (SHEATH) ×2 IMPLANT
GUIDEWIRE VAS SION BLUE 190 (WIRE) ×2 IMPLANT
KIT ENCORE 26 ADVANTAGE (KITS) ×2 IMPLANT
KIT HEART LEFT (KITS) ×2 IMPLANT
PACK CARDIAC CATHETERIZATION (CUSTOM PROCEDURE TRAY) ×2 IMPLANT
STENT ONYX FRONTIER 2.5X12 (Permanent Stent) ×2 IMPLANT
TRANSDUCER W/MONITORING KIT (MISCELLANEOUS) ×2 IMPLANT
TUBING CIL FLEX 10 FLL-RA (TUBING) ×2 IMPLANT
WIRE EMERALD 3MM-J .035X260CM (WIRE) ×2 IMPLANT

## 2020-12-10 NOTE — Progress Notes (Signed)
Discussed stent, Effient, restrictions, diet, exercise as tolerated (due to back), NTG, and CRPII. Pt receptive. Will place referral for [facility] CRPII however pt prefers to exercise on her own. [place], ACSM 2:53 PM 12/10/2020

## 2020-12-10 NOTE — Progress Notes (Signed)
Mobility Specialist Progress Note    12/10/20 1305  Mobility  Activity Ambulated in hall  Level of Assistance Independent after set-up  Assistive Device None  Distance Ambulated (ft) 350 ft  Mobility Ambulated independently in hallway  Mobility Response Tolerated well  Mobility performed by Mobility specialist  $Mobility charge 1 Mobility   Post-Mobility: 75 HR  Pt received in bed and agreeable. No complaints on walk. Returned to sitting EOB with lunch and call bell in reach.   Hildred Alamin Mobility Specialist  Mobility Specialist Phone: [phone]

## 2020-12-10 NOTE — Discharge Summary (Signed)
Discharge Summary    Patient ID: [name] MRN: [id number]; DOB: 1955-07-13  Admit date: 12/09/2020 Discharge date: 12/10/2020  PCP:  Emelia Loron, NP   [facility] Providers Cardiologist:  Sinclair Grooms, MD     Discharge Diagnoses    Principal Problem:   Unstable angina [facility]) Active Problems:   Hypertension   Hyperlipidemia   Diagnostic Studies/Procedures    Cath: 12/10/20    Mid LAD to Dist LAD lesion is 25% stenosed.   Mid LAD lesion is 80% stenosed.   Previously placed Ost LAD to Prox LAD stent (unknown type) is  widely patent.   A stent was successfully placed.   Post intervention, there is a 0% residual stenosis.   LV end diastolic pressure is normal.   1.  Progression of moderate lesion in mid LAD treated with 1 drug-eluting stent overlapping the previously placed proximal LAD stents; the patient continues to have 2 layers of stents in the overlapping regions in the proximal and mid LAD.  The previously placed stents were widely patent. 2.  LVEDP of 17 mmHg with preserved ejection fraction.   Recommendation: Continue aspirin and Effient as she is a Plavix nonresponder and intolerant of Brilinta.  Aggressive treatment of cardiovascular risk factors.  Diagnostic Dominance: Right Intervention   _____________   History of Present Illness     [name] is a 65 y.o. female with history of GERD, colon cancer, CAD, DM, HTN, HLD, hypothyroidism and sleep apnea who was seen in the office on 11/7 with Dr. Angelena Form. She is followed in our office by Dr. Tamala Julian. She had PCI of her LAD in 2005 with placement of overlapping Cypher drug eluting stents in the proximal LAD. She had restenosis of these stents treated with PTCA/scoring balloon angioplasty in May 2019 and repeat PCI in August 2019 with placement of a drug eluting stent in the ostial/proximal LAD. Repeat cath in October 2019 with patent LAD stents. She is a Plavix non-responder. She did not tolerate  Brilinta. She has been on ASA/Effient.    In the office she reported having chest pain over the past several weeks. The pain was in the center of her chest and radiated into her neck. She was having chest pain the morning of admission that worsened when she walked into our office. She had a syncopal episode on Friday and another episode on Saturday. She described "spells" where she shakes and trembles. She has had weakness for two months. This pain felt like the pain she had before her stents were placed.   She was sent for admission with plans for cardiac cath.   Hospital Course     Underwent cardiac cath noted above with progression of moderate lesion in mid LAD treated with PCI/DES x1 in overlapping fashion of previously placed LAD stents.  Continues to have 2 layers of stent and overlapping regions of the proximal and mid LAD.  LVEDP 17 mmHg.  Plan to continue on DAPT with aspirin/Effient.  She is noted to be a Plavix nonresponder and intolerant of Brilinta. No issues noted post cath. Will continue on prior home medications without significant changes.    Did the patient have an acute coronary syndrome (MI, NSTEMI, STEMI, etc) this admission?:  No                               Did the patient have a percutaneous coronary intervention (stent /  angioplasty)?:  Yes.     Cath/PCI Registry Performance & Quality Measures: Aspirin prescribed? - Yes ADP Receptor Inhibitor (Plavix/Clopidogrel, Brilinta/Ticagrelor or Effient/Prasugrel) prescribed (includes medically managed patients)? - Yes High Intensity Statin (Lipitor 40-80mg  or Crestor 20-40mg ) prescribed? - Yes For EF <40%, was ACEI/ARB prescribed? - Not Applicable (EF >/= 62%) For EF <40%, Aldosterone Antagonist (Spironolactone or Eplerenone) prescribed? - Not Applicable (EF >/= 70%) Cardiac Rehab Phase II ordered? - Yes       The patient will be scheduled for a TOC follow up appointment in 10-14 days.  A message has been sent to the [facility] and Scheduling Pool at the office where the patient should be seen for follow up.  _____________  Discharge Vitals Blood pressure 138/73, pulse 70, temperature 97.7 F (36.5 C), temperature source Oral, resp. rate 16, height 5\' 4"  (1.626 m), weight 102.8 kg, SpO2 98 %.  Filed Weights   12/09/20 1314  Weight: 102.8 kg    Labs & Radiologic Studies    CBC Recent Labs    12/09/20 1545 12/10/20 0059  WBC 6.3 6.1  HGB 12.0 12.3  HCT 36.8 37.3  MCV 93.9 92.6  PLT 143* 350*   Basic Metabolic Panel Recent Labs    12/09/20 1328 12/09/20 1545 12/10/20 0059  NA 138  --  137  K 3.9  --  4.9  CL 104  --  103  CO2 25  --  26  GLUCOSE 104*  --  181*  BUN 25*  --  23  CREATININE 1.19* 1.19* 1.19*  CALCIUM 8.5*  --  8.8*   Liver Function Tests No results for input(s): AST, ALT, ALKPHOS, BILITOT, PROT, ALBUMIN in the last 72 hours. No results for input(s): LIPASE, AMYLASE in the last 72 hours. High Sensitivity Troponin:   Recent Labs  Lab 12/09/20 1328 12/09/20 1545 12/09/20 1650  TROPONINIHS 6 5 5     BNP Invalid input(s): POCBNP D-Dimer No results for input(s): DDIMER in the last 72 hours. Hemoglobin A1C Recent Labs    12/09/20 1545  HGBA1C 7.1*   Fasting Lipid Panel Recent Labs    12/10/20 0059  CHOL 207*  HDL 52  LDLCALC 108*  TRIG 233*  CHOLHDL 4.0   Thyroid Function Tests No results for input(s): TSH, T4TOTAL, T3FREE, THYROIDAB in the last 72 hours.  Invalid input(s): FREET3 _____________  CARDIAC CATHETERIZATION  Result Date: 12/10/2020   Mid LAD to Dist LAD lesion is 25% stenosed.   Mid LAD lesion is 80% stenosed.   Previously placed Ost LAD to Prox LAD stent (unknown type) is  widely patent.   A stent was successfully placed.   Post intervention, there is a 0% residual stenosis.   LV end diastolic pressure is normal. 1.  Progression of moderate lesion in mid LAD treated with 1 drug-eluting stent overlapping the previously placed proximal LAD  stents; the patient continues to have 2 layers of stents in the overlapping regions in the proximal and mid LAD.  The previously placed stents were widely patent. 2.  LVEDP of 17 mmHg with preserved ejection fraction. Recommendation: Continue aspirin and Effient as she is a Plavix nonresponder and intolerant of Brilinta.  Aggressive treatment of cardiovascular risk factors.   Disposition   Pt is being discharged home today in good condition.  Follow-up Plans & Appointments     Follow-up Information     Imogene Burn, PA-C Follow up on 01/01/2021.   Specialty: Cardiology Why: at 9:45am for your  follow up appt Contact information: [address] [place] [address] [phone]                Discharge Instructions     AMB Referral to Cardiac Rehabilitation - Phase II   Complete by: As directed    Diagnosis: Coronary Stents   After initial evaluation and assessments completed: Virtual Based Care may be provided alone or in conjunction with Phase 2 Cardiac Rehab based on patient barriers.: Yes       Discharge Medications   Allergies as of 12/10/2020       Reactions   Tape Other (See Comments)   Adhesive breaks me out (rash)   Band-aid Infection Defense [bacitracin-polymyxin B] Other (See Comments)   unknown   Exenatide    Other reaction(s): Unknown   Iodinated Diagnostic Agents Swelling   Ticagrelor    Other reaction(s): Unknown   Morphine Itching        Medication List     STOP taking these medications    mirabegron ER 50 MG Tb24 tablet Commonly known as: MYRBETRIQ   nitrofurantoin 50 MG capsule Commonly known as: MACRODANTIN       TAKE these medications    acyclovir 800 MG tablet Commonly known as: ZOVIRAX Take 800 mg by mouth See admin instructions. Times as needed for fever blisters   aspirin EC 81 MG tablet Take 81 mg by mouth daily.   B-D ULTRAFINE III SHORT PEN 31G X 8 MM Misc Generic drug: Insulin Pen Needle Use  as directed.   B-D ULTRAFINE III SHORT PEN 31G X 8 MM Misc Generic drug: Insulin Pen Needle See admin instructions.   budesonide-formoterol 160-4.5 MCG/ACT inhaler Commonly known as: SYMBICORT Inhale 2 puffs into the lungs daily.   diazepam 5 MG tablet Commonly known as: VALIUM Take 5 mg by mouth 2 (two) times daily.   estradiol 0.1 MG/GM vaginal cream Commonly known as: ESTRACE Place 1 Applicatorful vaginally daily as needed (dryness).   fluticasone 50 MCG/ACT nasal spray Commonly known as: FLONASE Place 1 spray into both nostrils 2 (two) times daily.   furosemide 40 MG tablet Commonly known as: LASIX Take 40 mg by mouth daily.   gabapentin 300 MG capsule Commonly known as: NEURONTIN Take 300 mg by mouth 3 (three) times daily as needed (pain).   HumaLOG KwikPen 100 UNIT/ML KwikPen Generic drug: insulin lispro Inject 5-20 Units into the skin 3 (three) times daily. Sliding scale   hydrOXYzine 25 MG capsule Commonly known as: VISTARIL Take 25 mg by mouth 3 (three) times daily as needed for anxiety or itching.   insulin degludec 100 UNIT/ML FlexTouch Pen Commonly known as: TRESIBA Inject 15-30 Units into the skin See admin instructions. Takes 30 units in the morning and 15 units in the evening   ketoconazole 2 % shampoo Commonly known as: NIZORAL Apply 1 application topically 2 (two) times a week.   levothyroxine 125 MCG tablet Commonly known as: SYNTHROID Take 125 mcg by mouth daily before breakfast.   losartan 25 MG tablet Commonly known as: COZAAR Take 1 tablet (25 mg total) by mouth daily. Please keep upcoming appt in May 2022 with Dr. Tamala Julian before anymore refills. Thank you Final Attempt What changed: additional instructions   methocarbamol 500 MG tablet Commonly known as: ROBAXIN Take 500 mg by mouth 2 (two) times daily.   metoprolol succinate 100 MG 24 hr tablet Commonly known as: TOPROL-XL Take 100 mg by mouth daily.   mirtazapine  30 MG  tablet Commonly known as: REMERON Take 1 tablet (30 mg total) by mouth at bedtime.   montelukast 10 MG tablet Commonly known as: SINGULAIR Take 10 mg by mouth daily as needed (allergy).   mupirocin ointment 2 % Commonly known as: BACTROBAN Apply 1 application topically daily as needed (skin irritation).   nitroGLYCERIN 0.4 MG SL tablet Commonly known as: Nitrostat PLACE 1 TABLET (0.4 MG TOTAL) UNDER THE TONGUE EVERY 5 (FIVE) MINUTES AS NEEDED FOR CHEST PAIN. What changed:  how much to take when to take this reasons to take this additional instructions   Oxycodone HCl 10 MG Tabs Take 10 mg by mouth in the morning, at noon, in the evening, and at bedtime.   pantoprazole 40 MG tablet Commonly known as: PROTONIX Take 1 tablet (40 mg total) by mouth daily. Please make yearly appt with Dr. Tamala Julian for November before anymore refills. 1st attempt What changed: additional instructions   potassium chloride SA 20 MEQ tablet Commonly known as: KLOR-CON Take 20 mEq by mouth daily.   prasugrel 10 MG Tabs tablet Commonly known as: EFFIENT TAKE 1 TABLET BY MOUTH EVERY DAY   rosuvastatin 40 MG tablet Commonly known as: CRESTOR Take 40 mg by mouth daily.   sertraline 50 MG tablet Commonly known as: ZOLOFT Take 50 mg by mouth daily.   Synjardy XR 12.06-998 MG Tb24 Generic drug: Empagliflozin-metFORMIN HCl ER Take 1 tablet by mouth daily. Notes to patient: Please do not resume until 11/11   tiotropium 18 MCG inhalation capsule Commonly known as: SPIRIVA Place 18 mcg into inhaler and inhale daily.         Outstanding Labs/Studies   N/a   Duration of Discharge Encounter   Greater than 30 minutes including physician time.  Signed, Reino Bellis, NP 12/10/2020, 11:19 AM  Patient seen, examined. Available data reviewed. Agree with findings, assessment, and plan as outlined by Reino Bellis, NP. Patient is doing well post-PCI. Cath films are reviewed and she underwent  uncomplicated LAD PCI today with stent placement just distal to the previously implanted LAD stents. On my exam, she is alert, oriented, in NAD. Lungs CTA, CV: RRR no murmur, right wrist TR band in place, abd: soft, NT, ext: no edema. Stable for same day DC protocol. She will continue on ASA and prasugrel as a plavix non-responder intolerant to ticagrelor. Follow-up as above. Post-PCI instructions reviewed with patient and her son who is present.   Sherren Mocha, M.D. 12/10/2020 11:51 AM

## 2020-12-10 NOTE — Progress Notes (Signed)
TR band was deflated for first time after 2 hours from inflation at 9:31---from 12 cc to 9cc, experienced slight bleeding, continued to watch and waited until after 2 more hours to deflate down to 6--patient is bleeding more significantly under band---put 3cc back in band and continued to watch---bleeding slowed but continued, I have called cath lab and talked with Gaspar Bidding, he will come to room to look--I have replaced TR band with full 12cc air and will restart pulling air out at 1800

## 2020-12-11 LAB — BASIC METABOLIC PANEL
Anion gap: 8 (ref 5–15)
BUN: 24 mg/dL — ABNORMAL HIGH (ref 8–23)
CO2: 25 mmol/L (ref 22–32)
Calcium: 8.7 mg/dL — ABNORMAL LOW (ref 8.9–10.3)
Chloride: 104 mmol/L (ref 98–111)
Creatinine, Ser: 1.08 mg/dL — ABNORMAL HIGH (ref 0.44–1.00)
GFR, Estimated: 57 mL/min — ABNORMAL LOW (ref >60)
Glucose, Bld: 292 mg/dL — ABNORMAL HIGH (ref 70–99)
Potassium: 4.2 mmol/L (ref 3.5–5.1)
Sodium: 137 mmol/L (ref 135–145)

## 2020-12-11 LAB — GLUCOSE, CAPILLARY: Glucose-Capillary: 225 mg/dL — ABNORMAL HIGH (ref 70–99)

## 2020-12-11 LAB — CBC
HCT: 33.9 % — ABNORMAL LOW (ref 36.0–46.0)
Hemoglobin: 11.3 g/dL — ABNORMAL LOW (ref 12.0–15.0)
MCH: 30.6 pg (ref 26.0–34.0)
MCHC: 33.3 g/dL (ref 30.0–36.0)
MCV: 91.9 fL (ref 80.0–100.0)
Platelets: 106 10*3/uL — ABNORMAL LOW (ref 150–400)
RBC: 3.69 MIL/uL — ABNORMAL LOW (ref 3.87–5.11)
RDW: 13.1 % (ref 11.5–15.5)
WBC: 5.9 10*3/uL (ref 4.0–10.5)
nRBC: 0 % (ref 0.0–0.2)

## 2020-12-11 MED ORDER — METOPROLOL SUCCINATE ER 100 MG PO TB24
100.0000 mg | ORAL_TABLET | Freq: Every day | ORAL | Status: DC
  Administered 2020-12-11: 100 mg via ORAL
  Filled 2020-12-11 (×2): qty 1

## 2020-12-11 NOTE — Progress Notes (Signed)
Pt discharging home with son.  All instructions given and reviewed, all questions answered

## 2020-12-11 NOTE — Discharge Summary (Signed)
Discharge Summary    Patient ID: [name] MRN: [id number]; DOB: 28-Jan-1956  Admit date: 12/09/2020 Discharge date: 12/11/2020  PCP:  Emelia Loron, NP   [facility] Providers Cardiologist:  Sinclair Grooms, MD     Discharge Diagnoses    Principal Problem:   Unstable angina [facility]) Active Problems:   Hypertension   Hyperlipidemia   Diagnostic Studies/Procedures    Cath: 12/10/20    Mid LAD to Dist LAD lesion is 25% stenosed.   Mid LAD lesion is 80% stenosed.   Previously placed Ost LAD to Prox LAD stent (unknown type) is  widely patent.   A stent was successfully placed.   Post intervention, there is a 0% residual stenosis.   LV end diastolic pressure is normal.   1.  Progression of moderate lesion in mid LAD treated with 1 drug-eluting stent overlapping the previously placed proximal LAD stents; the patient continues to have 2 layers of stents in the overlapping regions in the proximal and mid LAD.  The previously placed stents were widely patent. 2.  LVEDP of 17 mmHg with preserved ejection fraction.   Recommendation: Continue aspirin and Effient as she is a Plavix nonresponder and intolerant of Brilinta.  Aggressive treatment of cardiovascular risk factors.  Diagnostic Dominance: Right Intervention   _____________   History of Present Illness     [name] is a 65 y.o. female with history of GERD, colon cancer, CAD, DM, HTN, HLD, hypothyroidism and sleep apnea who was seen in the office on 11/7 with Dr. Angelena Fields. She is followed in our office by Dr. Tamala Fields. She had PCI of her LAD in 2005 with placement of overlapping Cypher drug eluting stents in the proximal LAD. She had restenosis of these stents treated with PTCA/scoring balloon angioplasty in May 2019 and repeat PCI in August 2019 with placement of a drug eluting stent in the ostial/proximal LAD. Repeat cath in October 2019 with patent LAD stents. She is a Plavix non-responder. She did not tolerate  Brilinta. She has been on ASA/Effient.    In the office she reported having chest pain over the past several weeks. The pain was in the center of her chest and radiated into her neck. She was having chest pain the morning of admission that worsened when she walked into our office. She had a syncopal episode on Friday and another episode on Saturday. She described "spells" where she shakes and trembles. She has had weakness for two months. This pain felt like the pain she had before her stents were placed.   She was sent for admission with plans for cardiac cath.   Hospital Course     Underwent cardiac cath noted above with progression of moderate lesion in mid LAD treated with PCI/DES x1 in overlapping fashion of previously placed LAD stents.  Continues to have 2 layers of stent and overlapping regions of the proximal and mid LAD.  LVEDP 17 mmHg.  Plan to continue on DAPT with aspirin/Effient.  She is noted to be a Plavix nonresponder and intolerant of Brilinta. No issues noted post cath. Will continue on prior home medications without significant changes. Initially planned for discharge but developed bleeding with TR band deflation. This resolved as she was observed overnight.    Did the patient have an acute coronary syndrome (MI, NSTEMI, STEMI, etc) this admission?:  No  Did the patient have a percutaneous coronary intervention (stent / angioplasty)?:  Yes.     Cath/PCI Registry Performance & Quality Measures: Aspirin prescribed? - Yes ADP Receptor Inhibitor (Plavix/Clopidogrel, Brilinta/Ticagrelor or Effient/Prasugrel) prescribed (includes medically managed patients)? - Yes High Intensity Statin (Lipitor 40-80mg  or Crestor 20-40mg ) prescribed? - Yes For EF <40%, was ACEI/ARB prescribed? - Not Applicable (EF >/= 75%) For EF <40%, Aldosterone Antagonist (Spironolactone or Eplerenone) prescribed? - Not Applicable (EF >/= 64%) Cardiac Rehab Phase II ordered? - Yes        The patient will be scheduled for a TOC follow up appointment in 10-14 days.  A message has been sent to the [name] and Scheduling Pool at the office where the patient should be seen for follow up.  _____________  Discharge Vitals Blood pressure (!) 145/69, pulse 65, temperature 98 F (36.7 C), temperature source Oral, resp. rate 19, height 5\' 4"  (1.626 m), weight 102.8 kg, SpO2 97 %.  Filed Weights   12/09/20 1314  Weight: 102.8 kg    Labs & Radiologic Studies    CBC Recent Labs    12/10/20 0059 12/11/20 0137  WBC 6.1 5.9  HGB 12.3 11.3*  HCT 37.3 33.9*  MCV 92.6 91.9  PLT 112* 332*   Basic Metabolic Panel Recent Labs    12/10/20 0059 12/11/20 0137  NA 137 137  K 4.9 4.2  CL 103 104  CO2 26 25  GLUCOSE 181* 292*  BUN 23 24*  CREATININE 1.19* 1.08*  CALCIUM 8.8* 8.7*   Liver Function Tests No results for input(s): AST, ALT, ALKPHOS, BILITOT, PROT, ALBUMIN in the last 72 hours. No results for input(s): LIPASE, AMYLASE in the last 72 hours. High Sensitivity Troponin:   Recent Labs  Lab 12/09/20 1328 12/09/20 1545 12/09/20 1650  TROPONINIHS 6 5 5     BNP Invalid input(s): POCBNP D-Dimer No results for input(s): DDIMER in the last 72 hours. Hemoglobin A1C Recent Labs    12/09/20 1545  HGBA1C 7.1*   Fasting Lipid Panel Recent Labs    12/10/20 0059  CHOL 207*  HDL 52  LDLCALC 108*  TRIG 233*  CHOLHDL 4.0   Thyroid Function Tests No results for input(s): TSH, T4TOTAL, T3FREE, THYROIDAB in the last 72 hours.  Invalid input(s): FREET3 _____________  CARDIAC CATHETERIZATION  Result Date: 12/10/2020   Mid LAD to Dist LAD lesion is 25% stenosed.   Mid LAD lesion is 80% stenosed.   Previously placed Ost LAD to Prox LAD stent (unknown type) is  widely patent.   A stent was successfully placed.   Post intervention, there is a 0% residual stenosis.   LV end diastolic pressure is normal. 1.  Progression of moderate lesion in mid LAD treated with 1  drug-eluting stent overlapping the previously placed proximal LAD stents; the patient continues to have 2 layers of stents in the overlapping regions in the proximal and mid LAD.  The previously placed stents were widely patent. 2.  LVEDP of 17 mmHg with preserved ejection fraction. Recommendation: Continue aspirin and Effient as she is a Plavix nonresponder and intolerant of Brilinta.  Aggressive treatment of cardiovascular risk factors.   Disposition   Pt is being discharged home today in good condition.  Follow-up Plans & Appointments     Follow-up Information     Imogene Burn, PA-C Follow up on 01/01/2021.   Specialty: Cardiology Why: at 9:45am for your follow up appt Contact information: [address] [place]  [id number] [phone]                Discharge Instructions     Amb Referral to Cardiac Rehabilitation   Complete by: As directed    Diagnosis:  Coronary Stents PTCA     After initial evaluation and assessments completed: Virtual Based Care may be provided alone or in conjunction with Phase 2 Cardiac Rehab based on patient barriers.: Yes   Call MD for:  difficulty breathing, headache or visual disturbances   Complete by: As directed    Call MD for:  difficulty breathing, headache or visual disturbances   Complete by: As directed    Call MD for:  persistant dizziness or light-headedness   Complete by: As directed    Call MD for:  persistant dizziness or light-headedness   Complete by: As directed    Call MD for:  redness, tenderness, or signs of infection (pain, swelling, redness, odor or green/yellow discharge around incision site)   Complete by: As directed    Call MD for:  redness, tenderness, or signs of infection (pain, swelling, redness, odor or green/yellow discharge around incision site)   Complete by: As directed    Diet - low sodium heart healthy   Complete by: As directed    Discharge instructions   Complete by: As  directed    Radial Site Care Refer to this sheet in the next few weeks. These instructions provide you with information on caring for yourself after your procedure. Your caregiver may also give you more specific instructions. Your treatment has been planned according to current medical practices, but problems sometimes occur. Call your caregiver if you have any problems or questions after your procedure. HOME CARE INSTRUCTIONS You may shower the day after the procedure. Remove the bandage (dressing) and gently wash the site with plain soap and water. Gently pat the site dry.  Do not apply powder or lotion to the site.  Do not submerge the affected site in water for 3 to 5 days.  Inspect the site at least twice daily.  Do not flex or bend the affected arm for 24 hours.  No lifting over 5 pounds (2.3 kg) for 5 days after your procedure.  Do not drive home if you are discharged the same day of the procedure. Have someone else drive you.  You may drive 24 hours after the procedure unless otherwise instructed by your caregiver.  What to expect: Any bruising will usually fade within 1 to 2 weeks.  Blood that collects in the tissue (hematoma) may be painful to the touch. It should usually decrease in size and tenderness within 1 to 2 weeks.  SEEK IMMEDIATE MEDICAL CARE IF: You have unusual pain at the radial site.  You have redness, warmth, swelling, or pain at the radial site.  You have drainage (other than a small amount of blood on the dressing).  You have chills.  You have a fever or persistent symptoms for more than 72 hours.  You have a fever and your symptoms suddenly get worse.  Your arm becomes pale, cool, tingly, or numb.  You have heavy bleeding from the site. Hold pressure on the site.   PLEASE DO NOT MISS ANY DOSES OF YOUR EFFIENT!!!!! Also keep a log of you blood pressures and bring back to your follow up appt. Please call the office with any questions.   Patients taking blood  thinners should generally stay away from medicines like ibuprofen, Advil, Motrin, naproxen, and Aleve  due to risk of stomach bleeding. You may take Tylenol as directed or talk to your primary doctor about alternatives.   PLEASE ENSURE THAT YOU DO NOT RUN OUT OF YOUR EFFIENT. This medication is very important to remain on for at least one year. IF you have issues obtaining this medication due to cost please CALL the office 3-5 business days prior to running out in order to prevent missing doses of this medication.   Discharge instructions   Complete by: As directed    Radial Site Care Refer to this sheet in the next few weeks. These instructions provide you with information on caring for yourself after your procedure. Your caregiver may also give you more specific instructions. Your treatment has been planned according to current medical practices, but problems sometimes occur. Call your caregiver if you have any problems or questions after your procedure. HOME CARE INSTRUCTIONS You may shower the day after the procedure. Remove the bandage (dressing) and gently wash the site with plain soap and water. Gently pat the site dry.  Do not apply powder or lotion to the site.  Do not submerge the affected site in water for 3 to 5 days.  Inspect the site at least twice daily.  Do not flex or bend the affected arm for 24 hours.  No lifting over 5 pounds (2.3 kg) for 5 days after your procedure.  Do not drive home if you are discharged the same day of the procedure. Have someone else drive you.  You may drive 24 hours after the procedure unless otherwise instructed by your caregiver.  What to expect: Any bruising will usually fade within 1 to 2 weeks.  Blood that collects in the tissue (hematoma) may be painful to the touch. It should usually decrease in size and tenderness within 1 to 2 weeks.  SEEK IMMEDIATE MEDICAL CARE IF: You have unusual pain at the radial site.  You have redness, warmth,  swelling, or pain at the radial site.  You have drainage (other than a small amount of blood on the dressing).  You have chills.  You have a fever or persistent symptoms for more than 72 hours.  You have a fever and your symptoms suddenly get worse.  Your arm becomes pale, cool, tingly, or numb.  You have heavy bleeding from the site. Hold pressure on the site.   Increase activity slowly   Complete by: As directed    Increase activity slowly   Complete by: As directed    No wound care   Complete by: As directed        Discharge Medications   Allergies as of 12/11/2020       Reactions   Tape Other (See Comments)   Adhesive breaks me out (rash)   Band-aid Infection Defense [bacitracin-polymyxin B] Other (See Comments)   unknown   Exenatide    Other reaction(s): Unknown   Iodinated Diagnostic Agents Swelling   Ticagrelor    Other reaction(s): Unknown   Morphine Itching        Medication List     STOP taking these medications    mirabegron ER 50 MG Tb24 tablet Commonly known as: MYRBETRIQ   nitrofurantoin 50 MG capsule Commonly known as: MACRODANTIN       TAKE these medications    acyclovir 800 MG tablet Commonly known as: ZOVIRAX Take 800 mg by mouth See admin instructions. Times as needed for fever blisters   aspirin EC 81 MG tablet Take 81 mg  by mouth daily.   B-D ULTRAFINE III SHORT PEN 31G X 8 MM Misc Generic drug: Insulin Pen Needle Use as directed.   B-D ULTRAFINE III SHORT PEN 31G X 8 MM Misc Generic drug: Insulin Pen Needle See admin instructions.   budesonide-formoterol 160-4.5 MCG/ACT inhaler Commonly known as: SYMBICORT Inhale 2 puffs into the lungs daily.   diazepam 5 MG tablet Commonly known as: VALIUM Take 5 mg by mouth 2 (two) times daily.   estradiol 0.1 MG/GM vaginal cream Commonly known as: ESTRACE Place 1 Applicatorful vaginally daily as needed (dryness).   fluticasone 50 MCG/ACT nasal spray Commonly known as:  FLONASE Place 1 spray into both nostrils 2 (two) times daily.   furosemide 40 MG tablet Commonly known as: LASIX Take 40 mg by mouth daily.   gabapentin 300 MG capsule Commonly known as: NEURONTIN Take 300 mg by mouth 3 (three) times daily as needed (pain).   HumaLOG KwikPen 100 UNIT/ML KwikPen Generic drug: insulin lispro Inject 5-20 Units into the skin 3 (three) times daily. Sliding scale   hydrOXYzine 25 MG capsule Commonly known as: VISTARIL Take 25 mg by mouth 3 (three) times daily as needed for anxiety or itching.   insulin degludec 100 UNIT/ML FlexTouch Pen Commonly known as: TRESIBA Inject 15-30 Units into the skin See admin instructions. Takes 30 units in the morning and 15 units in the evening   ketoconazole 2 % shampoo Commonly known as: NIZORAL Apply 1 application topically 2 (two) times a week.   levothyroxine 125 MCG tablet Commonly known as: SYNTHROID Take 125 mcg by mouth daily before breakfast.   losartan 25 MG tablet Commonly known as: COZAAR Take 1 tablet (25 mg total) by mouth daily. Please keep upcoming appt in May 2022 with Dr. Tamala Fields before anymore refills. Thank you Final Attempt What changed: additional instructions   methocarbamol 500 MG tablet Commonly known as: ROBAXIN Take 500 mg by mouth 2 (two) times daily.   metoprolol succinate 100 MG 24 hr tablet Commonly known as: TOPROL-XL Take 100 mg by mouth daily.   mirtazapine 30 MG tablet Commonly known as: REMERON Take 1 tablet (30 mg total) by mouth at bedtime.   montelukast 10 MG tablet Commonly known as: SINGULAIR Take 10 mg by mouth daily as needed (allergy).   mupirocin ointment 2 % Commonly known as: BACTROBAN Apply 1 application topically daily as needed (skin irritation).   nitroGLYCERIN 0.4 MG SL tablet Commonly known as: Nitrostat PLACE 1 TABLET (0.4 MG TOTAL) UNDER THE TONGUE EVERY 5 (FIVE) MINUTES AS NEEDED FOR CHEST PAIN. What changed:  how much to take when to take  this reasons to take this additional instructions   Oxycodone HCl 10 MG Tabs Take 10 mg by mouth in the morning, at noon, in the evening, and at bedtime.   pantoprazole 40 MG tablet Commonly known as: PROTONIX Take 1 tablet (40 mg total) by mouth daily. Please make yearly appt with Dr. Tamala Fields for November before anymore refills. 1st attempt What changed: additional instructions   potassium chloride SA 20 MEQ tablet Commonly known as: KLOR-CON Take 20 mEq by mouth daily.   prasugrel 10 MG Tabs tablet Commonly known as: EFFIENT TAKE 1 TABLET BY MOUTH EVERY DAY   rosuvastatin 40 MG tablet Commonly known as: CRESTOR Take 40 mg by mouth daily.   sertraline 50 MG tablet Commonly known as: ZOLOFT Take 50 mg by mouth daily.   Synjardy XR 12.06-998 MG Tb24 Generic drug: Empagliflozin-metFORMIN HCl ER Take  1 tablet by mouth daily. Notes to patient: Please do not resume until 11/11   tiotropium 18 MCG inhalation capsule Commonly known as: SPIRIVA Place 18 mcg into inhaler and inhale daily.         Outstanding Labs/Studies   N/a   Duration of Discharge Encounter   Greater than 30 minutes including physician time.  Signed, Reino Bellis, NP 12/11/2020, 8:44 AM

## 2020-12-11 NOTE — Progress Notes (Signed)
Progress Note  Patient Name: [name] Date of Encounter: 12/11/2020  [facility] Cardiologist: Sinclair Grooms, MD   Subjective   Feels well.  No chest pain or shortness of breath.  Notes that TR band could not fully come off until about midnight and that is why she was kept overnight.  No other problems reported.  Inpatient Medications    Scheduled Meds:  aspirin EC  81 mg Oral Daily   heparin  5,000 Units Subcutaneous Q8H   insulin aspart  0-15 Units Subcutaneous TID WC   metoprolol succinate  100 mg Oral Daily   prasugrel  10 mg Oral Daily   rosuvastatin  40 mg Oral Daily   sodium chloride flush  3 mL Intravenous Q12H   sodium chloride flush  3 mL Intravenous Q12H   Continuous Infusions:  sodium chloride     PRN Meds: sodium chloride, acetaminophen, nitroGLYCERIN, ondansetron (ZOFRAN) IV, sodium chloride flush   Vital Signs    Vitals:   12/11/20 0050 12/11/20 0055 12/11/20 0444 12/11/20 0756  BP: (!) 178/72  (!) 155/81 (!) 145/69  Pulse:   75 65  Resp: 16 17 16 19   Temp:   97.8 F (36.6 C) 98 F (36.7 C)  TempSrc:   Oral Oral  SpO2:   98% 97%  Weight:      Height:        Intake/Output Summary (Last 24 hours) at 12/11/2020 0836 Last data filed at 12/11/2020 0446 Gross per 24 hour  Intake 1056.67 ml  Output 0 ml  Net 1056.67 ml   Last 3 Weights 12/09/2020 12/09/2020 04/29/2020  Weight (lbs) 226 lb 10.1 oz 228 lb 6.4 oz 222 lb  Weight (kg) 102.8 kg 103.602 kg 100.699 kg  Some encounter information is confidential and restricted. Go to Review Flowsheets activity to see all data.       Physical Exam  Alert, oriented, no distress GEN: No acute distress.   Neck: No JVD Cardiac: RRR, no murmurs, rubs, or gallops.  Respiratory: Clear to auscultation bilaterally. GI: Soft, nontender, non-distended  MS: No edema; No deformity.  Left radial cath site clear with no hematoma or ecchymosis Neuro:  Nonfocal  Psych: Normal affect   Labs    High  Sensitivity Troponin:   Recent Labs  Lab 12/09/20 1328 12/09/20 1545 12/09/20 1650  TROPONINIHS 6 5 5      Chemistry Recent Labs  Lab 12/09/20 1328 12/09/20 1545 12/10/20 0059 12/11/20 0137  NA 138  --  137 137  K 3.9  --  4.9 4.2  CL 104  --  103 104  CO2 25  --  26 25  GLUCOSE 104*  --  181* 292*  BUN 25*  --  23 24*  CREATININE 1.19* 1.19* 1.19* 1.08*  CALCIUM 8.5*  --  8.8* 8.7*  GFRNONAA 51* 51* 51* 57*  ANIONGAP 9  --  8 8    Lipids  Recent Labs  Lab 12/10/20 0059  CHOL 207*  TRIG 233*  HDL 52  LDLCALC 108*  CHOLHDL 4.0    Hematology Recent Labs  Lab 12/09/20 1545 12/10/20 0059 12/11/20 0137  WBC 6.3 6.1 5.9  RBC 3.92 4.03 3.69*  HGB 12.0 12.3 11.3*  HCT 36.8 37.3 33.9*  MCV 93.9 92.6 91.9  MCH 30.6 30.5 30.6  MCHC 32.6 33.0 33.3  RDW 13.2 12.9 13.1  PLT 143* 112* 106*   Thyroid No results for input(s): TSH, FREET4 in the last  168 hours.  BNPNo results for input(s): BNP, PROBNP in the last 168 hours.  DDimer No results for input(s): DDIMER in the last 168 hours.   Radiology    CARDIAC CATHETERIZATION  Result Date: 12/10/2020   Mid LAD to Dist LAD lesion is 25% stenosed.   Mid LAD lesion is 80% stenosed.   Previously placed Ost LAD to Prox LAD stent (unknown type) is  widely patent.   A stent was successfully placed.   Post intervention, there is a 0% residual stenosis.   LV end diastolic pressure is normal. 1.  Progression of moderate lesion in mid LAD treated with 1 drug-eluting stent overlapping the previously placed proximal LAD stents; the patient continues to have 2 layers of stents in the overlapping regions in the proximal and mid LAD.  The previously placed stents were widely patent. 2.  LVEDP of 17 mmHg with preserved ejection fraction. Recommendation: Continue aspirin and Effient as she is a Plavix nonresponder and intolerant of Brilinta.  Aggressive treatment of cardiovascular risk factors.     Patient Profile     65 y.o. female with  known CAD presenting with unstable angina, undergoes PCI of the LAD 26/09/3417 without complication.  Assessment & Plan    1.  Unstable angina: As per yesterday's note, the patient did well with PCI and implantation of a drug-eluting stent in the LAD.  She will continue aspirin and prasugrel.  Continue medical program without further change. 2.  Hypertension: Systolic blood pressure with mild to moderate elevation.  Advised her to monitor at home and bring readings into her follow-up visit.  Will not make any changes today. 3.  Mixed hyperlipidemia: Continue rosuvastatin 40 mg daily.  Disposition: Home this morning  For questions or updates, please contact [place] Please consult [email or web address] for contact info under        Signed, Sherren Mocha, MD  12/11/2020, 8:36 AM

## 2020-12-12 ENCOUNTER — Telehealth: Payer: Self-pay | Admitting: Interventional Cardiology

## 2020-12-12 ENCOUNTER — Other Ambulatory Visit: Payer: Self-pay

## 2020-12-12 MED ORDER — ROSUVASTATIN CALCIUM 40 MG PO TABS: 40.0000 mg | ORAL_TABLET | Freq: Every day | ORAL | 3 refills | Status: DC

## 2020-12-12 MED ORDER — NITROGLYCERIN 0.4 MG SL SUBL: SUBLINGUAL_TABLET | SUBLINGUAL | 8 refills | Status: AC

## 2020-12-12 NOTE — Telephone Encounter (Signed)
Called pt and left message informing pt that her medications were sent to her pharmacy as requested. Confirmation received.

## 2020-12-12 NOTE — Telephone Encounter (Signed)
Ok to fill 

## 2020-12-12 NOTE — Telephone Encounter (Signed)
Pt calling requesting a refill on rosuvastatin. Would Dr. Tamala Julian like to refill this medication? Please address

## 2020-12-12 NOTE — Telephone Encounter (Signed)
The patient had a recent PCI on 12/10/20. She is now experiencing high BP 189/98, right leg numbness and face numbness. Advised patient to call 911 for a stroke evaluation.  Verbalized understanding.

## 2020-12-12 NOTE — Telephone Encounter (Signed)
Pt c/o BP issue: STAT if pt c/o blurred vision, one-sided weakness or slurred speech  1. What are your last 5 BP readings? 189/98  2. Are you having any other symptoms (ex. Dizziness, headache, blurred vision, passed out)? Face is numb and right leg is numb  3. What is your BP issue? high

## 2020-12-13 ENCOUNTER — Ambulatory Visit: Payer: Medicare Other | Admitting: Urology
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
## 2021-01-01 ENCOUNTER — Telehealth: Payer: Self-pay | Admitting: Physician Assistant

## 2021-01-01 ENCOUNTER — Ambulatory Visit: Payer: Medicare Other | Admitting: Physician Assistant

## 2021-01-01 NOTE — Telephone Encounter (Signed)
Patient had appt today, but on her way in her car broke down.  Patient already has appt scheduled for 02/05/21 with Selinda Eon, however she states she recently had a stent put in, and had a stroke shortly after that.  She would like to be seen sooner that 02/05/21

## 2021-01-01 NOTE — Telephone Encounter (Signed)
Pt scheduled with CW at [facility] on 12/15 @ 10:55.
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
## 2021-01-16 ENCOUNTER — Ambulatory Visit (HOSPITAL_BASED_OUTPATIENT_CLINIC_OR_DEPARTMENT_OTHER): Payer: Medicare Other | Admitting: Nurse Practitioner

## 2021-01-16 ENCOUNTER — Other Ambulatory Visit: Payer: Self-pay
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
## 2021-02-05 ENCOUNTER — Ambulatory Visit (INDEPENDENT_AMBULATORY_CARE_PROVIDER_SITE_OTHER): Payer: 59 | Admitting: Physician Assistant

## 2021-02-05 ENCOUNTER — Other Ambulatory Visit: Payer: Self-pay

## 2021-02-05 ENCOUNTER — Encounter: Payer: Self-pay | Admitting: Physician Assistant

## 2021-02-05 VITALS — BP 150/62 | HR 73 | Ht 64.0 in | Wt 228.6 lb

## 2021-02-05 DIAGNOSIS — I10 Essential (primary) hypertension: Secondary | ICD-10-CM | POA: Diagnosis not present

## 2021-02-05 DIAGNOSIS — Z8673 Personal history of transient ischemic attack (TIA), and cerebral infarction without residual deficits: Secondary | ICD-10-CM | POA: Diagnosis not present

## 2021-02-05 DIAGNOSIS — E785 Hyperlipidemia, unspecified: Secondary | ICD-10-CM

## 2021-02-05 DIAGNOSIS — I25110 Atherosclerotic heart disease of native coronary artery with unstable angina pectoris: Secondary | ICD-10-CM

## 2021-02-05 DIAGNOSIS — R079 Chest pain, unspecified: Secondary | ICD-10-CM

## 2021-02-05 MED ORDER — AMLODIPINE BESYLATE 5 MG PO TABS
5.0000 mg | ORAL_TABLET | Freq: Every day | ORAL | 3 refills | Status: DC
Start: 2021-02-05 — End: 2022-02-12

## 2021-02-05 NOTE — Patient Instructions (Addendum)
Medication Instructions:  Your physician has recommended you make the following change in your medication:   START Amlodipine 5 mg taking 1 daily    *If you need a refill on your cardiac medications before your next appointment, please call your pharmacy*   Lab Work: None ordered  If you have labs (blood work) drawn today and your tests are completely normal, you will receive your results only by: [place] (if you have MyChart) OR A paper copy in the mail If you have any lab test that is abnormal or we need to change your treatment, we will call you to review the results.   Testing/Procedures: Your physician has requested that you have a lexiscan myoview. For further information please visit [email or web address]. Please follow instruction sheet, BELOW:    You are scheduled for a Myocardial Perfusion Imaging Study Please arrive 15 minutes prior to your appointment time for registration and insurance purposes.  The test will take approximately 3 to 4 hours to complete; you may bring reading material.  If someone comes with you to your appointment, they will need to remain in the main lobby due to limited space in the testing area. **If you are pregnant or breastfeeding, please notify the nuclear lab prior to your appointment**  How to prepare for your Myocardial Perfusion Test: Do not eat or drink 3 hours prior to your test, except you may have water. Do not consume products containing caffeine (regular or decaffeinated) 12 hours prior to your test. (ex: coffee, chocolate, sodas, tea). Do bring a list of your current medications with you.  If not listed below, you may take your medications as normal. Do wear comfortable clothes (no dresses or overalls) and walking shoes, tennis shoes preferred (No heels or open toe shoes are allowed). Do NOT wear cologne, perfume, aftershave, or lotions (deodorant is allowed). If these instructions are not followed, your test will have to be  rescheduled.      Follow-Up: At [facility], you and your health needs are our priority.  As part of our continuing mission to provide you with exceptional heart care, we have created designated Provider Care Teams.  These Care Teams include your primary Cardiologist (physician) and Advanced Practice Providers (APPs -  Physician Assistants and Nurse Practitioners) who all work together to provide you with the care you need, when you need it.  We recommend signing up for the patient portal called "MyChart".  Sign up information is provided on this After Visit Summary.  MyChart is used to connect with patients for Virtual Visits (Telemedicine).  Patients are able to view lab/test results, encounter notes, upcoming appointments, etc.  Non-urgent messages can be sent to your provider as well.   To learn more about what you can do with MyChart, go to [email or web address].    Your next appointment:   1 month(s)  The format for your next appointment:   In Person  Provider:   Sinclair Grooms, MD  or Ermalinda Barrios, PA-C         Other Instructions

## 2021-02-06 ENCOUNTER — Other Ambulatory Visit: Payer: Self-pay

## 2021-02-06 DIAGNOSIS — R079 Chest pain, unspecified: Secondary | ICD-10-CM

## 2021-02-13 ENCOUNTER — Encounter (HOSPITAL_COMMUNITY): Payer: 59
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
## 2021-02-19 ENCOUNTER — Other Ambulatory Visit: Payer: Self-pay

## 2021-02-19 ENCOUNTER — Ambulatory Visit (INDEPENDENT_AMBULATORY_CARE_PROVIDER_SITE_OTHER): Payer: 59 | Admitting: Interventional Cardiology

## 2021-02-19 ENCOUNTER — Encounter: Payer: Self-pay | Admitting: Interventional Cardiology

## 2021-02-19 VITALS — BP 142/74 | HR 66 | Ht 64.0 in | Wt 227.0 lb

## 2021-02-19 DIAGNOSIS — I10 Essential (primary) hypertension: Secondary | ICD-10-CM | POA: Diagnosis not present

## 2021-02-19 DIAGNOSIS — I25110 Atherosclerotic heart disease of native coronary artery with unstable angina pectoris: Secondary | ICD-10-CM

## 2021-02-19 DIAGNOSIS — Z8673 Personal history of transient ischemic attack (TIA), and cerebral infarction without residual deficits: Secondary | ICD-10-CM | POA: Diagnosis not present

## 2021-02-19 DIAGNOSIS — R0602 Shortness of breath: Secondary | ICD-10-CM

## 2021-02-19 DIAGNOSIS — F331 Major depressive disorder, recurrent, moderate: Secondary | ICD-10-CM

## 2021-02-19 DIAGNOSIS — Z79899 Other long term (current) drug therapy: Secondary | ICD-10-CM

## 2021-02-19 DIAGNOSIS — E785 Hyperlipidemia, unspecified: Secondary | ICD-10-CM

## 2021-02-19 DIAGNOSIS — I6381 Other cerebral infarction due to occlusion or stenosis of small artery: Secondary | ICD-10-CM

## 2021-02-19 DIAGNOSIS — R079 Chest pain, unspecified: Secondary | ICD-10-CM

## 2021-02-19 MED ORDER — FUROSEMIDE 40 MG PO TABS: 60.0000 mg | ORAL_TABLET | Freq: Every day | ORAL | 3 refills | Status: DC

## 2021-02-19 NOTE — Patient Instructions (Addendum)
Increase lasix to 60 mg daily  Your physician recommends that you continue on your current medications as directed. Please refer to the Current Medication list given to you today.  *If you need a refill on your cardiac medications before your next appointment, please call your pharmacy*   Lab Work: BMET and PRO BNP today  If you have labs (blood work) drawn today and your tests are completely normal, you will receive your results only by: [place] (if you have MyChart) OR A paper copy in the mail If you have any lab test that is abnormal or we need to change your treatment, we will call you to review the results.   Testing/Procedures: none   Follow-Up: At [facility], you and your health needs are our priority.  As part of our continuing mission to provide you with exceptional heart care, we have created designated Provider Care Teams.  These Care Teams include your primary Cardiologist (physician) and Advanced Practice Providers (APPs -  Physician Assistants and Nurse Practitioners) who all work together to provide you with the care you need, when you need it.  We recommend signing up for the patient portal called "MyChart".  Sign up information is provided on this After Visit Summary.  MyChart is used to connect with patients for Virtual Visits (Telemedicine).  Patients are able to view lab/test results, encounter notes, upcoming appointments, etc.  Non-urgent messages can be sent to your provider as well.   To learn more about what you can do with MyChart, go to [email or web address].    Your next appointment:  Keep 04/2021 appt with Dr. Tamala Julian    Other Instructions  Referral to Pulmonary

## 2021-02-20 ENCOUNTER — Encounter (HOSPITAL_COMMUNITY): Payer: 59

## 2021-02-20 ENCOUNTER — Other Ambulatory Visit: Payer: Self-pay

## 2021-02-20 LAB — PRO B NATRIURETIC PEPTIDE: NT-Pro BNP: <36 pg/mL (ref 0–301)

## 2021-02-20 LAB — BASIC METABOLIC PANEL
BUN/Creatinine Ratio: 25 (ref 12–28)
BUN: 30 mg/dL — ABNORMAL HIGH (ref 8–27)
CO2: 28 mmol/L (ref 20–29)
Calcium: 9.4 mg/dL (ref 8.7–10.3)
Chloride: 100 mmol/L (ref 96–106)
Creatinine, Ser: 1.18 mg/dL — ABNORMAL HIGH (ref 0.57–1.00)
Glucose: 310 mg/dL — ABNORMAL HIGH (ref 70–99)
Potassium: 4.8 mmol/L (ref 3.5–5.2)
Sodium: 139 mmol/L (ref 134–144)
eGFR: 51 mL/min/{1.73_m2} — ABNORMAL LOW (ref >59)

## 2021-02-20 MED ORDER — PRASUGREL HCL 10 MG PO TABS: 10.0000 mg | ORAL_TABLET | Freq: Every day | ORAL | 3 refills | Status: DC

## 2021-03-12 ENCOUNTER — Other Ambulatory Visit (HOSPITAL_COMMUNITY): Payer: Self-pay | Admitting: Nurse Practitioner

## 2021-03-12 DIAGNOSIS — Z1231 Encounter for screening mammogram for malignant neoplasm of breast: Secondary | ICD-10-CM

## 2021-03-13 ENCOUNTER — Ambulatory Visit (INDEPENDENT_AMBULATORY_CARE_PROVIDER_SITE_OTHER): Payer: Medicare Other | Admitting: Gastroenterology

## 2021-03-20 ENCOUNTER — Ambulatory Visit (HOSPITAL_COMMUNITY): Payer: 59

## 2021-03-21 ENCOUNTER — Institutional Professional Consult (permissible substitution): Payer: 59 | Admitting: Internal Medicine

## 2021-03-21 NOTE — Progress Notes (Unsigned)
[name], female    DOB: 11-13-1955    MRN: [id number]   Brief patient profile:  ***  yo*** *** referred to pulmonary clinic in [place]  03/21/2021 by *** for ***      History of Present Illness  03/21/2021  Pulmonary/ 1st office eval/ Melvyn Novas /  Office  No chief complaint on file.    Dyspnea:  *** Cough: *** Sleep: *** SABA use:   Past Medical History:  Diagnosis Date   Acid reflux    Angina pectoris, unspecified ([place])    Anxiety    Arthritis    Arthritis    Asthma    Cancer, colon (HCC)    colon   Chest pain    Coronary artery disease    a. LAD PCI in 2005 b. cath in 2014 showed patent pLAD stent c. Cath 06/11/17 severe ISR of overlapping Cypher DES in pLAD s/p cutting ballon PTCA only; mild dLAD dz   Diabetes mellitus    Diabetic neuropathy (HCC)    GERD (gastroesophageal reflux disease)    Heart disease    History of skin cancer    Hypercholesteremia    Hypertension    Hypothyroidism    Myocardial infarction ([place]) 1998   Panic attack    PONV (postoperative nausea and vomiting)    Sleep apnea    Sleep apnea    Thyroid disease     Outpatient Medications Prior to Visit  Medication Sig Dispense Refill   acyclovir (ZOVIRAX) 800 MG tablet Take 800 mg by mouth See admin instructions. Times as needed for fever blisters     amLODipine (NORVASC) 5 MG tablet Take 1 tablet (5 mg total) by mouth daily. 90 tablet 3   aspirin EC 81 MG tablet Take 81 mg by mouth daily.     B-D ULTRAFINE III SHORT PEN 31G X 8 MM MISC See admin instructions.     budesonide-formoterol (SYMBICORT) 160-4.5 MCG/ACT inhaler Inhale 2 puffs into the lungs daily.     clindamycin-benzoyl peroxide (BENZACLIN) gel Apply topically.     diazepam (VALIUM) 5 MG tablet Take 5 mg by mouth 2 (two) times daily.     Empagliflozin-metFORMIN HCl ER (SYNJARDY XR) 12.06-998 MG TB24 Take 1 tablet by mouth daily.     estradiol (ESTRACE) 0.1 MG/GM vaginal cream Place 1 Applicatorful vaginally daily  as needed (dryness).     fluticasone (FLONASE) 50 MCG/ACT nasal spray Place 1 spray into both nostrils 2 (two) times daily.     furosemide (LASIX) 40 MG tablet Take 1.5 tablets (60 mg total) by mouth daily. 90 tablet 3   gabapentin (NEURONTIN) 300 MG capsule Take 300 mg by mouth 3 (three) times daily as needed (pain).     HUMALOG KWIKPEN 100 UNIT/ML KwikPen Inject 5-20 Units into the skin 3 (three) times daily. Sliding scale     hydrOXYzine (VISTARIL) 25 MG capsule Take 25 mg by mouth 3 (three) times daily as needed for anxiety or itching.     insulin degludec (TRESIBA) 100 UNIT/ML FlexTouch Pen Inject 15-30 Units into the skin See admin instructions. Takes 30 units in the morning and 15 units in the evening     Insulin Pen Needle (B-D ULTRAFINE III SHORT PEN) 31G X 8 MM MISC Use as directed.     ketoconazole (NIZORAL) 2 % shampoo Apply 1 application topically 2 (two) times a week.     levothyroxine (SYNTHROID, LEVOTHROID) 125 MCG tablet Take 125 mcg by mouth daily before breakfast.  losartan (COZAAR) 25 MG tablet Take 1 tablet (25 mg total) by mouth daily. Please keep upcoming appt in May 2022 with Dr. Tamala Julian before anymore refills. Thank you Final Attempt (Patient taking differently: Take 25 mg by mouth daily.) 30 tablet 2   methocarbamol (ROBAXIN) 500 MG tablet Take 500 mg by mouth 2 (two) times daily.     metoprolol succinate (TOPROL-XL) 100 MG 24 hr tablet Take 100 mg by mouth daily.     mirtazapine (REMERON) 30 MG tablet Take 1 tablet (30 mg total) by mouth at bedtime. 30 tablet 0   montelukast (SINGULAIR) 10 MG tablet Take 10 mg by mouth daily as needed (allergy).     mupirocin ointment (BACTROBAN) 2 % Apply 1 application topically daily as needed (skin irritation).     nitrofurantoin (MACRODANTIN) 50 MG capsule Take 50 mg by mouth at bedtime.     nitroGLYCERIN (NITROSTAT) 0.4 MG SL tablet PLACE 1 TABLET (0.4 MG TOTAL) UNDER THE TONGUE EVERY 5 (FIVE) MINUTES AS NEEDED FOR CHEST PAIN. 25  tablet 8   oxybutynin (DITROPAN-XL) 5 MG 24 hr tablet Take 5 mg by mouth daily.     Oxycodone HCl 10 MG TABS Take 10 mg by mouth in the morning, at noon, in the evening, and at bedtime.     pantoprazole (PROTONIX) 40 MG tablet Take 1 tablet (40 mg total) by mouth daily. Please make yearly appt with Dr. Tamala Julian for November before anymore refills. 1st attempt (Patient taking differently: Take 40 mg by mouth daily.) 90 tablet 0   potassium chloride SA (K-DUR,KLOR-CON) 20 MEQ tablet Take 20 mEq by mouth daily.     prasugrel (EFFIENT) 10 MG TABS tablet Take 1 tablet (10 mg total) by mouth daily. 90 tablet 3   rosuvastatin (CRESTOR) 40 MG tablet Take 1 tablet (40 mg total) by mouth daily. 90 tablet 3   sertraline (ZOLOFT) 50 MG tablet Take 50 mg by mouth daily.     tiotropium (SPIRIVA) 18 MCG inhalation capsule Place 18 mcg into inhaler and inhale daily.     No facility-administered medications prior to visit.     Objective:     There were no vitals taken for this visit.         Assessment   No problem-specific Assessment & Plan notes found for this encounter.     Christinia Gully, MD 03/21/2021
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
## 2021-04-08 ENCOUNTER — Ambulatory Visit (INDEPENDENT_AMBULATORY_CARE_PROVIDER_SITE_OTHER): Payer: 59 | Admitting: Interventional Cardiology

## 2021-04-08 ENCOUNTER — Encounter: Payer: Self-pay | Admitting: Interventional Cardiology

## 2021-04-08 ENCOUNTER — Other Ambulatory Visit: Payer: Self-pay

## 2021-04-08 VITALS — BP 134/62 | HR 63 | Ht 64.0 in | Wt 224.6 lb

## 2021-04-08 DIAGNOSIS — I6381 Other cerebral infarction due to occlusion or stenosis of small artery: Secondary | ICD-10-CM

## 2021-04-08 DIAGNOSIS — E785 Hyperlipidemia, unspecified: Secondary | ICD-10-CM | POA: Diagnosis not present

## 2021-04-08 DIAGNOSIS — I10 Essential (primary) hypertension: Secondary | ICD-10-CM | POA: Diagnosis not present

## 2021-04-08 DIAGNOSIS — I25110 Atherosclerotic heart disease of native coronary artery with unstable angina pectoris: Secondary | ICD-10-CM

## 2021-04-08 DIAGNOSIS — R079 Chest pain, unspecified: Secondary | ICD-10-CM

## 2021-04-08 NOTE — Patient Instructions (Signed)
Medication Instructions:  ?Your physician recommends that you continue on your current medications as directed. Please refer to the Current Medication list given to you today. ? ?*If you need a refill on your cardiac medications before your next appointment, please call your pharmacy* ? ? ?Lab Work: ?NONE TODAY ?If you have labs (blood work) drawn today and your tests are completely normal, you will receive your results only by: ?MyChart Message (if you have MyChart) OR ?A paper copy in the mail ?If you have any lab test that is abnormal or we need to change your treatment, we will call you to review the results. ? ? ?Testing/Procedures: ?NONE TODAY  ? ? ?Follow-Up: ?At [facility], you and your health needs are our priority.  As part of our continuing mission to provide you with exceptional heart care, we have created designated Provider Care Teams.  These Care Teams include your primary Cardiologist (physician) and Advanced Practice Providers (APPs -  Physician Assistants and Nurse Practitioners) who all work together to provide you with the care you need, when you need it. ? ?We recommend signing up for the patient portal called "MyChart".  Sign up information is provided on this After Visit Summary.  MyChart is used to connect with patients for Virtual Visits (Telemedicine).  Patients are able to view lab/test results, encounter notes, upcoming appointments, etc.  Non-urgent messages can be sent to your provider as well.   ?To learn more about what you can do with MyChart, go to [email or web address].   ? ?Your next appointment:   ?6 TO 9 month(s) ? ?The format for your next appointment:   ?In Person ? ?Provider:   ?Sinclair Grooms, MD   ? ? ? ?

## 2021-04-24 ENCOUNTER — Ambulatory Visit (INDEPENDENT_AMBULATORY_CARE_PROVIDER_SITE_OTHER): Payer: Medicaid Other | Admitting: Gastroenterology

## 2021-05-04 NOTE — Progress Notes (Deleted)
? ?[name], female    DOB: 1955/09/05   MRN: [id number] ? ? ?Brief patient profile:  ?***  yo*** *** referred to pulmonary clinic in [place]  05/05/2021 by *** for ***  ? ? ? ? ?History of Present Illness  ?05/05/2021  Pulmonary/ 1st office eval/ Melvyn Novas / [facility]  ?No chief complaint on file. ?  ? ?Dyspnea:  *** ?Cough: *** ?Sleep: *** ?SABA use:  ? ?Past Medical History:  ?Diagnosis Date  ? Acid reflux   ? Angina pectoris, unspecified ([place])   ? Anxiety   ? Arthritis   ? Arthritis   ? Asthma   ? Cancer, colon ([place])   ? colon  ? Chest pain   ? Coronary artery disease   ? a. LAD PCI in 2005 b. cath in 2014 showed patent pLAD stent c. Cath 06/11/17 severe ISR of overlapping Cypher DES in pLAD s/p cutting ballon PTCA only; mild dLAD dz  ? Diabetes mellitus   ? Diabetic neuropathy ([place])   ? GERD (gastroesophageal reflux disease)   ? Heart disease   ? History of skin cancer   ? Hypercholesteremia   ? Hypertension   ? Hypothyroidism   ? Myocardial infarction [facility]) 1998  ? Panic attack   ? PONV (postoperative nausea and vomiting)   ? Sleep apnea   ? Sleep apnea   ? Thyroid disease   ? ? ?Outpatient Medications Prior to Visit  ?Medication Sig Dispense Refill  ? acyclovir (ZOVIRAX) 800 MG tablet Take 800 mg by mouth See admin instructions. Times as needed for fever blisters    ? amLODipine (NORVASC) 5 MG tablet Take 1 tablet (5 mg total) by mouth daily. 90 tablet 3  ? aspirin EC 81 MG tablet Take 81 mg by mouth daily.    ? B-D ULTRAFINE III SHORT PEN 31G X 8 MM MISC See admin instructions.    ? budesonide-formoterol (SYMBICORT) 160-4.5 MCG/ACT inhaler Inhale 2 puffs into the lungs daily.    ? clindamycin-benzoyl peroxide (BENZACLIN) gel Apply topically.    ? diazepam (VALIUM) 5 MG tablet Take 5 mg by mouth 2 (two) times daily.    ? Empagliflozin-metFORMIN HCl ER (SYNJARDY XR) 12.06-998 MG TB24 Take 1 tablet by mouth daily.    ? estradiol (ESTRACE) 0.1 MG/GM vaginal cream Place 1 Applicatorful vaginally daily as  needed (dryness).    ? fluticasone (FLONASE) 50 MCG/ACT nasal spray Place 1 spray into both nostrils 2 (two) times daily.    ? furosemide (LASIX) 40 MG tablet Take 1.5 tablets (60 mg total) by mouth daily. 90 tablet 3  ? gabapentin (NEURONTIN) 300 MG capsule Take 300 mg by mouth 3 (three) times daily as needed (pain).    ? HUMALOG KWIKPEN 100 UNIT/ML KwikPen Inject 5-20 Units into the skin 3 (three) times daily. Sliding scale    ? hydrOXYzine (VISTARIL) 25 MG capsule Take 25 mg by mouth 3 (three) times daily as needed for anxiety or itching.    ? insulin degludec (TRESIBA) 100 UNIT/ML FlexTouch Pen Inject 15-30 Units into the skin See admin instructions. Takes 30 units in the morning and 15 units in the evening    ? Insulin Pen Needle (B-D ULTRAFINE III SHORT PEN) 31G X 8 MM MISC Use as directed.    ? ketoconazole (NIZORAL) 2 % shampoo Apply 1 application topically 2 (two) times a week.    ? levothyroxine (SYNTHROID, LEVOTHROID) 125 MCG tablet Take 125 mcg by mouth daily before breakfast.    ?  loratadine (CLARITIN) 10 MG tablet Take 10 mg by mouth daily.    ? losartan (COZAAR) 25 MG tablet Take 1 tablet (25 mg total) by mouth daily. Please keep upcoming appt in May 2022 with Dr. Tamala Julian before anymore refills. Thank you Final Attempt (Patient taking differently: Take 25 mg by mouth daily.) 30 tablet 2  ? methocarbamol (ROBAXIN) 500 MG tablet Take 500 mg by mouth 2 (two) times daily.    ? metoprolol succinate (TOPROL-XL) 100 MG 24 hr tablet Take 100 mg by mouth daily.    ? mirtazapine (REMERON) 30 MG tablet Take 1 tablet (30 mg total) by mouth at bedtime. 30 tablet 0  ? montelukast (SINGULAIR) 10 MG tablet Take 10 mg by mouth daily as needed (allergy).    ? mupirocin ointment (BACTROBAN) 2 % Apply 1 application topically daily as needed (skin irritation).    ? nitrofurantoin (MACRODANTIN) 50 MG capsule Take 50 mg by mouth at bedtime.    ? nitroGLYCERIN (NITROSTAT) 0.4 MG SL tablet PLACE 1 TABLET (0.4 MG TOTAL) UNDER  THE TONGUE EVERY 5 (FIVE) MINUTES AS NEEDED FOR CHEST PAIN. 25 tablet 8  ? oxybutynin (DITROPAN-XL) 5 MG 24 hr tablet Take 5 mg by mouth daily.    ? Oxycodone HCl 10 MG TABS Take 10 mg by mouth in the morning, at noon, in the evening, and at bedtime.    ? pantoprazole (PROTONIX) 40 MG tablet Take 1 tablet (40 mg total) by mouth daily. Please make yearly appt with Dr. Tamala Julian for November before anymore refills. 1st attempt (Patient taking differently: Take 40 mg by mouth daily.) 90 tablet 0  ? potassium chloride SA (K-DUR,KLOR-CON) 20 MEQ tablet Take 20 mEq by mouth daily.    ? prasugrel (EFFIENT) 10 MG TABS tablet Take 1 tablet (10 mg total) by mouth daily. 90 tablet 3  ? rosuvastatin (CRESTOR) 40 MG tablet Take 1 tablet (40 mg total) by mouth daily. 90 tablet 3  ? sertraline (ZOLOFT) 50 MG tablet Take 50 mg by mouth daily.    ? tiotropium (SPIRIVA) 18 MCG inhalation capsule Place 18 mcg into inhaler and inhale daily.    ? ?No facility-administered medications prior to visit.  ? ? ? ?Objective:  ?  ? ?There were no vitals taken for this visit. ? ?  ? ? ?   ?Assessment  ? ?No problem-specific Assessment & Plan notes found for this encounter. ? ? ? ? ?Christinia Gully, MD ?05/04/2021 ?   ?

## 2021-05-05 ENCOUNTER — Institutional Professional Consult (permissible substitution): Payer: 59 | Admitting: Internal Medicine

## 2021-05-07 ENCOUNTER — Encounter: Payer: Self-pay | Admitting: Neurology

## 2021-06-05 ENCOUNTER — Encounter (INDEPENDENT_AMBULATORY_CARE_PROVIDER_SITE_OTHER): Payer: Self-pay | Admitting: Gastroenterology

## 2021-06-05 ENCOUNTER — Ambulatory Visit (INDEPENDENT_AMBULATORY_CARE_PROVIDER_SITE_OTHER): Payer: 59 | Admitting: Gastroenterology

## 2021-06-05 DIAGNOSIS — K7460 Unspecified cirrhosis of liver: Secondary | ICD-10-CM | POA: Diagnosis not present

## 2021-06-05 DIAGNOSIS — K7469 Other cirrhosis of liver: Secondary | ICD-10-CM | POA: Insufficient documentation

## 2021-06-05 DIAGNOSIS — K439 Ventral hernia without obstruction or gangrene: Secondary | ICD-10-CM | POA: Diagnosis not present

## 2021-06-05 DIAGNOSIS — K7581 Nonalcoholic steatohepatitis (NASH): Secondary | ICD-10-CM
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
## 2021-06-05 NOTE — Patient Instructions (Addendum)
Schedule EGD ?Perform blood workup ?Schedule liver US ?Continue furosemide 60 mg qday ?Nutrition referral ?- Reduce salt intake to <2 g per day ?- Can take Tylenol max of 2 g per day (650 mg q8h) for pain ?- Avoid NSAIDs for pain ?- Avoid eating raw oysters/shellfish ?- Protein shake (Ensure or Boost) every night before going to sleep ?

## 2021-06-09 ENCOUNTER — Telehealth (INDEPENDENT_AMBULATORY_CARE_PROVIDER_SITE_OTHER): Payer: Self-pay

## 2021-06-09 NOTE — Telephone Encounter (Signed)
Please notify the patient about her elevated glucose, she needs to call her PCP to discuss changes in her diabetes regimen ?I will give her a call once all labs are back as there are some that are in process ?

## 2021-06-09 NOTE — Telephone Encounter (Signed)
[facility] called to report a critical glucose of 417 on 06/05/2021 lab draw. ? ?Patient also called and wants to know what her labs showed.  ? ?Please advise.  ?

## 2021-06-09 NOTE — Telephone Encounter (Signed)
I called and left a message asked that the patient please return call.  

## 2021-06-10 LAB — COMPREHENSIVE METABOLIC PANEL
AG Ratio: 1.9 (calc) (ref 1.0–2.5)
ALT: 33 U/L — ABNORMAL HIGH (ref 6–29)
AST: 36 U/L — ABNORMAL HIGH (ref 10–35)
Albumin: 4 g/dL (ref 3.6–5.1)
Alkaline phosphatase (APISO): 108 U/L (ref 37–153)
BUN/Creatinine Ratio: 15 (calc) (ref 6–22)
BUN: 18 mg/dL (ref 7–25)
CO2: 30 mmol/L (ref 20–32)
Calcium: 8.7 mg/dL (ref 8.6–10.4)
Chloride: 101 mmol/L (ref 98–110)
Creat: 1.2 mg/dL — ABNORMAL HIGH (ref 0.50–1.05)
Globulin: 2.1 g/dL (calc) (ref 1.9–3.7)
Glucose, Bld: 417 mg/dL — ABNORMAL HIGH (ref 65–139)
Potassium: 4.4 mmol/L (ref 3.5–5.3)
Sodium: 140 mmol/L (ref 135–146)
Total Bilirubin: 0.5 mg/dL (ref 0.2–1.2)
Total Protein: 6.1 g/dL (ref 6.1–8.1)

## 2021-06-10 LAB — CBC WITH DIFFERENTIAL/PLATELET
Absolute Monocytes: 256 cells/uL (ref 200–950)
Basophils Absolute: 21 cells/uL (ref 0–200)
Basophils Relative: 0.6 %
Eosinophils Absolute: 158 cells/uL (ref 15–500)
Eosinophils Relative: 4.5 %
HCT: 34.9 % — ABNORMAL LOW (ref 35.0–45.0)
Hemoglobin: 10.9 g/dL — ABNORMAL LOW (ref 11.7–15.5)
Lymphs Abs: 872 cells/uL (ref 850–3900)
MCH: 28.4 pg (ref 27.0–33.0)
MCHC: 31.2 g/dL — ABNORMAL LOW (ref 32.0–36.0)
MCV: 90.9 fL (ref 80.0–100.0)
MPV: 11.2 fL (ref 7.5–12.5)
Monocytes Relative: 7.3 %
Neutro Abs: 2195 cells/uL (ref 1500–7800)
Neutrophils Relative %: 62.7 %
Platelets: 131 10*3/uL — ABNORMAL LOW (ref 140–400)
RBC: 3.84 10*6/uL (ref 3.80–5.10)
RDW: 13.8 % (ref 11.0–15.0)
Total Lymphocyte: 24.9 %
WBC: 3.5 10*3/uL — ABNORMAL LOW (ref 3.8–10.8)

## 2021-06-10 LAB — HEPATITIS PANEL, ACUTE
Hep A IgM: NONREACTIVE
Hep B C IgM: NONREACTIVE
Hepatitis B Surface Ag: NONREACTIVE
Hepatitis C Ab: NONREACTIVE
SIGNAL TO CUT-OFF: 0.08 (ref <1.00)

## 2021-06-10 LAB — IRON, TOTAL/TOTAL IRON BINDING CAP
%SAT: 12 % (calc) — ABNORMAL LOW (ref 16–45)
Iron: 45 ug/dL (ref 45–160)
TIBC: 375 mcg/dL (calc) (ref 250–450)

## 2021-06-10 LAB — HEPATITIS A ANTIBODY, TOTAL: Hepatitis A AB,Total: NONREACTIVE

## 2021-06-10 LAB — MITOCHONDRIAL ANTIBODIES: Mitochondrial M2 Ab, IgG: <=20 U (ref <=20.0)

## 2021-06-10 LAB — AFP TUMOR MARKER: AFP-Tumor Marker: 3.6 ng/mL

## 2021-06-10 LAB — ANA: Anti Nuclear Antibody (ANA): NEGATIVE

## 2021-06-10 LAB — PROTIME-INR
INR: 1
Prothrombin Time: 10.4 s (ref 9.0–11.5)

## 2021-06-10 LAB — FERRITIN: Ferritin: 10 ng/mL — ABNORMAL LOW (ref 16–288)

## 2021-06-10 LAB — HEPATITIS B SURFACE ANTIBODY,QUALITATIVE: Hep B S Ab: NONREACTIVE

## 2021-06-10 LAB — IGG: IgG (Immunoglobin G), Serum: 571 mg/dL — ABNORMAL LOW (ref 600–1540)

## 2021-06-10 LAB — ANTI-SMOOTH MUSCLE ANTIBODY, IGG: Actin (Smooth Muscle) Antibody (IGG): <20 U (ref <20)

## 2021-06-10 NOTE — Telephone Encounter (Signed)
I called and left a message asked that the patient please return call.  

## 2021-06-11 ENCOUNTER — Telehealth: Payer: Self-pay | Admitting: *Deleted

## 2021-06-11 ENCOUNTER — Encounter (INDEPENDENT_AMBULATORY_CARE_PROVIDER_SITE_OTHER): Payer: Self-pay

## 2021-06-11 ENCOUNTER — Other Ambulatory Visit (INDEPENDENT_AMBULATORY_CARE_PROVIDER_SITE_OTHER): Payer: Self-pay | Admitting: Gastroenterology

## 2021-06-11 DIAGNOSIS — D509 Iron deficiency anemia, unspecified: Secondary | ICD-10-CM

## 2021-06-11 MED ORDER — FERROUS SULFATE 325 (65 FE) MG PO TBEC: 325.0000 mg | DELAYED_RELEASE_TABLET | Freq: Every day | ORAL | 3 refills | Status: DC

## 2021-06-11 NOTE — Telephone Encounter (Signed)
I called and left a message asked that the patient please return call.  

## 2021-06-11 NOTE — Telephone Encounter (Signed)
? ?  Pre-operative Risk Assessment  ?  ?Patient Name: [name]  ?DOB: January 22, 1956 ?MRN: [id number]  ? ?  ? ?Request for Surgical Clearance   ? ?Procedure:   UPPER ENDOSCOPY ? ?Date of Surgery:  Clearance 07/15/21                              ?   ?Surgeon:  DR. DANIEL CASTANEDA ?Surgeon's Group or Practice Name:  [facility] ?Phone number:  [phone] ?Fax number:  [phone] ?  ?Type of Clearance Requested:   ?- Medical  ?- Pharmacy:  Hold Prasugrel (Effient) x 7 DAYS PRIOR  ?  ?Type of Anesthesia:  MAC ?  ?Additional requests/questions:   ? ?Signed, ?Julaine Hua   ?06/11/2021, 5:46 PM  ? ?

## 2021-06-12 ENCOUNTER — Institutional Professional Consult (permissible substitution): Payer: 59 | Admitting: Internal Medicine

## 2021-06-12 ENCOUNTER — Encounter (INDEPENDENT_AMBULATORY_CARE_PROVIDER_SITE_OTHER): Payer: Self-pay

## 2021-06-12 NOTE — Telephone Encounter (Signed)
[name]. [name] is requesting preoperative cardiac evaluation for upper endoscopy.  She was last seen in the clinic on 04/08/2021.  During that time she complained of parasternal discomfort.  She denied other complaints. ? ? ?Her PMH includes acid reflux, anxiety, arthritis, GERD, coronary artery disease with PCI and DES in 2005 and again in 2019.  Her PMH also includes hypothyroidism, postoperative nausea and vomiting, and OSA. ? ?May her Effient be held prior to her endoscopy procedure?   ? ?Thank you for your help.  Please direct your response to CV DIV preop pool. ? ?Jossie Ng. Colie Fugitt NP-C ? ?  ?06/12/2021, 1:31 PM ?[place] ?[address] ?Office [phone] Fax [phone] ? ?

## 2021-06-12 NOTE — Telephone Encounter (Signed)
Letter mailed

## 2021-06-12 NOTE — Telephone Encounter (Signed)
Patient not on anticoagulation 

## 2021-06-12 NOTE — Telephone Encounter (Signed)
? ?  Primary Cardiologist: Sinclair Grooms, MD ? ?Chart reviewed as part of pre-operative protocol coverage. Given past medical history and time since last visit, based on ACC/AHA guidelines, [name] would be at acceptable risk for the planned procedure without further cardiovascular testing.  ? ?Her Effient may be held for 7 days prior to her procedure.  Please resume as soon as hemostasis is achieved. ? ?I will route this recommendation to the requesting party via Epic fax function and remove from pre-op pool. ? ?Please call with questions. ? ?Jossie Ng. Kennedy Bohanon NP-C ? ?  ?06/12/2021, 2:33 PM ?[place] ?[address] ?Office [phone] Fax [phone] ? ? ? ? ?

## 2021-06-17 ENCOUNTER — Ambulatory Visit (INDEPENDENT_AMBULATORY_CARE_PROVIDER_SITE_OTHER): Payer: 59 | Admitting: Internal Medicine

## 2021-06-17 ENCOUNTER — Encounter: Payer: Self-pay | Admitting: Internal Medicine

## 2021-06-17 DIAGNOSIS — J4530 Mild persistent asthma, uncomplicated: Secondary | ICD-10-CM | POA: Diagnosis not present

## 2021-06-17 DIAGNOSIS — Z9989 Dependence on other enabling machines and devices: Secondary | ICD-10-CM | POA: Diagnosis not present

## 2021-06-17 DIAGNOSIS — G4733 Obstructive sleep apnea (adult) (pediatric): Secondary | ICD-10-CM

## 2021-06-17 MED ORDER — STIOLTO RESPIMAT 2.5-2.5 MCG/ACT IN AERS: 2.0000 | INHALATION_SPRAY | Freq: Every day | RESPIRATORY_TRACT | 11 refills | Status: DC

## 2021-06-17 MED ORDER — STIOLTO RESPIMAT 2.5-2.5 MCG/ACT IN AERS: 2.0000 | INHALATION_SPRAY | Freq: Every day | RESPIRATORY_TRACT | 0 refills | Status: DC

## 2021-06-17 NOTE — Assessment & Plan Note (Addendum)
Passive smoker exp only ?- PFT's  08/15/14  FEV1 2.09 (84 % ) ratio 0.81  p 11 % improvement from saba p spiriva prior to study with DLCO  17.57 (76%) corrects to 115% or alv volume and FV curve min concae  and ERV 42% at wt 220  ?- 06/17/2021  After extensive coaching inhaler device,  effectiveness =    75% with smi > start stiolto 2 puffs each am @ wt 222   ?- 06/17/2021   Walked on 2.5  x  2.5  lap(s) =  approx 400  ft  @ mod pace, stopped due to sob/ cp/dizzy and legs hurt  with lowest 02 sats 97%  ? 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  Also, some of her symptoms may be from using DPI spiriva chronically so to test the theory try stiolto sample but if having any worsening / flares rec change back to symbicort in lower doses to avoid irritating the upper airway while still covering the possiblity of asthma adequately  ? ?

## 2021-06-17 NOTE — Assessment & Plan Note (Addendum)
Complicated by OSA/ hbp/ hyperlipidemia, and ERV 42% at at 220  08/15/14  ? ?Body mass index is 38.17 kg/m?.  -  ?Lab Results  ?Component Value Date  ? TSH 12.392 (H) 12/02/2017  ?  ? ? ?Contributing to doe and risk of GERD ?>>>   reviewed the need and the process to achieve and maintain neg calorie balance > defer f/u primary care including intermittently monitoring thyroid status    ? ? ?Each maintenance medication was reviewed in detail including emphasizing most importantly the difference between maintenance and prns and under what circumstances the prns are to be triggered using an action plan format where appropriate. ? ?Total time for H and P, chart review, counseling, reviewing hfa/smi device(s) , directly observing portions of ambulatory 02 saturation study/ and generating customized AVS unique to this office visit / same day charting = 50 min with pt new to me  ?     ?  ?      ? ?

## 2021-06-17 NOTE — Assessment & Plan Note (Signed)
Referred to sleep medicine  06/17/2021 >>>  ?

## 2021-06-17 NOTE — Progress Notes (Signed)
? ?[name], female    DOB: January 16, 1956    MRN: [id number] ? ? ?Brief patient profile:  ?54   yowf  never smoker but lots of sinus infections, ear infections, asthma  requiring admit to [facility]  ? Age 66 ? then better as teenager just on prn saba/ pregnancy tol ok with baseline wt 175 @ 1982 did ok until needing daily symbicort/spriva  referred to pulmonary clinic in [place]  06/17/2021  by Dr Daneen Schick ? ? ? ? ?History of Present Illness  ?06/17/2021  Pulmonary/ 1st office eval/ Melvyn Novas / [facility]  ?Chief Complaint  ?Patient presents with  ? Consult  ?  Prev Dr. Luan Pulling patient. Was Dx with COPD and has been SOB lately  ?Dyspnea:  across the parking sob, [employer] ok / uses [facility] [address]  ?Can't carry trash to dumpster / gets chest pain also some better p stent with lowest sats 89%/ never did cardiac rehab  ?Cough: p stirs around in am/ min mucoid  ?Sleep: cpap per [place]/ sleeps flat with 2 pillows ?SABA use:  ?R lower post chest discomfort with deep breath x months and L ant cp mostly with ex, never pleuritic also x months no change in patterns  ? ?No obvious day to day or daytime variability or assoc excess/ purulent sputum or mucus plugs or hemoptysis or cp or chest tightness, subjective wheeze or overt sinus or hb symptoms.  ? ?Sleeping as above without nocturnal  or early am exacerbation  of respiratory  c/o's or need for noct saba. Also denies any obvious fluctuation of symptoms with weather or environmental changes or other aggravating or alleviating factors except as outlined above ? ?No unusual exposure hx or h/o childhood pna  or knowledge of premature birth. ? ?Current Allergies, Complete Past Medical History, Past Surgical History, Family History, and Social History were reviewed in [employer] record. ? ?ROS  The following are not active complaints unless bolded ?Hoarseness, sore throat, dysphagia, dental problems, itching, sneezing,  nasal congestion or  discharge of excess mucus or purulent secretions, ear ache,   fever, chills, sweats, unintended wt loss or wt gain,  orthopnea pnd or arm/hand swelling  or leg swelling, presyncope, palpitations, abdominal pain, anorexia, nausea, vomiting, diarrhea  or change in bowel habits or change in bladder habits, change in stools or change in urine, dysuria, hematuria,  rash, arthralgias, visual complaints, headache, numbness, weakness or ataxia or problems with walking or coordination,  change in mood or  memory. ?      ?   ? ? ? ?Past Medical History:  ?Diagnosis Date  ? Acid reflux   ? Angina pectoris, unspecified ([place])   ? Anxiety   ? Arthritis   ? Arthritis   ? Asthma   ? Cancer, colon ([place])   ? colon  ? Chest pain   ? Coronary artery disease   ? a. LAD PCI in 2005 b. cath in 2014 showed patent pLAD stent c. Cath 06/11/17 severe ISR of overlapping Cypher DES in pLAD s/p cutting ballon PTCA only; mild dLAD dz  ? Diabetes mellitus   ? Diabetic neuropathy ([place])   ? GERD (gastroesophageal reflux disease)   ? Heart disease   ? History of skin cancer   ? Hypercholesteremia   ? Hypertension   ? Hypothyroidism   ? Myocardial infarction [facility]) 1998  ? Panic attack   ? PONV (postoperative nausea and vomiting)   ? Sleep apnea   ?  Sleep apnea   ? Thyroid disease   ? ? ?Outpatient Medications Prior to Visit  ?Medication Sig Dispense Refill  ? acyclovir (ZOVIRAX) 800 MG tablet Take 800 mg by mouth See admin instructions. Times as needed for fever blisters    ? aspirin EC 81 MG tablet Take 81 mg by mouth daily.    ? B-D ULTRAFINE III SHORT PEN 31G X 8 MM MISC See admin instructions.    ? budesonide-formoterol (SYMBICORT) 160-4.5 MCG/ACT inhaler Inhale 2 puffs into the lungs daily.    ? clindamycin-benzoyl peroxide (BENZACLIN) gel Apply topically.    ? diazepam (VALIUM) 5 MG tablet Take 5 mg by mouth 2 (two) times daily.    ? Empagliflozin-metFORMIN HCl ER (SYNJARDY XR) 12.06-998 MG TB24 Take 1 tablet by mouth daily.    ? estradiol  (ESTRACE) 0.1 MG/GM vaginal cream Place 1 Applicatorful vaginally daily as needed (dryness).    ? ferrous sulfate 325 (65 FE) MG EC tablet Take 1 tablet (325 mg total) by mouth daily. 90 tablet 3  ? fluticasone (FLONASE) 50 MCG/ACT nasal spray Place 1 spray into both nostrils 2 (two) times daily.    ? furosemide (LASIX) 40 MG tablet Take 1.5 tablets (60 mg total) by mouth daily. 90 tablet 3  ? gabapentin (NEURONTIN) 300 MG capsule Take 300 mg by mouth 3 (three) times daily as needed (pain).    ? HUMALOG KWIKPEN 100 UNIT/ML KwikPen Inject 5-20 Units into the skin 3 (three) times daily. Sliding scale    ? hydrOXYzine (VISTARIL) 25 MG capsule Take 25 mg by mouth 3 (three) times daily as needed for anxiety or itching.    ? insulin degludec (TRESIBA) 100 UNIT/ML FlexTouch Pen Inject 15-30 Units into the skin See admin instructions. Takes 30 units in the morning and 15 units in the evening    ? Insulin Pen Needle (B-D ULTRAFINE III SHORT PEN) 31G X 8 MM MISC Use as directed.    ? ketoconazole (NIZORAL) 2 % shampoo Apply 1 application topically 2 (two) times a week.    ? levothyroxine (SYNTHROID, LEVOTHROID) 125 MCG tablet Take 125 mcg by mouth daily before breakfast.    ? loratadine (CLARITIN) 10 MG tablet Take 10 mg by mouth daily.    ? losartan (COZAAR) 25 MG tablet Take 1 tablet (25 mg total) by mouth daily. Please keep upcoming appt in May 2022 with Dr. Tamala Julian before anymore refills. Thank you Final Attempt (Patient taking differently: Take 25 mg by mouth daily.) 30 tablet 2  ? methocarbamol (ROBAXIN) 500 MG tablet Take 500 mg by mouth 2 (two) times daily.    ? metoprolol succinate (TOPROL-XL) 100 MG 24 hr tablet Take 100 mg by mouth daily.    ? mirtazapine (REMERON) 30 MG tablet Take 1 tablet (30 mg total) by mouth at bedtime. 30 tablet 0  ? montelukast (SINGULAIR) 10 MG tablet Take 10 mg by mouth daily as needed (allergy).    ? mupirocin ointment (BACTROBAN) 2 % Apply 1 application topically daily as needed (skin  irritation).    ? nitrofurantoin (MACRODANTIN) 50 MG capsule Take 50 mg by mouth at bedtime. '25mg'$  daily    ? nitroGLYCERIN (NITROSTAT) 0.4 MG SL tablet PLACE 1 TABLET (0.4 MG TOTAL) UNDER THE TONGUE EVERY 5 (FIVE) MINUTES AS NEEDED FOR CHEST PAIN. 25 tablet 8  ? Oxycodone HCl 10 MG TABS Take 10 mg by mouth in the morning, at noon, in the evening, and at bedtime.    ? pantoprazole (  PROTONIX) 40 MG tablet Take 1 tablet (40 mg total) by mouth daily. Please make yearly appt with Dr. Tamala Julian for November before anymore refills. 1st attempt (Patient taking differently: Take 40 mg by mouth daily.) 90 tablet 0  ? potassium chloride SA (K-DUR,KLOR-CON) 20 MEQ tablet Take 20 mEq by mouth daily.    ? prasugrel (EFFIENT) 10 MG TABS tablet Take 1 tablet (10 mg total) by mouth daily. 90 tablet 3  ? rosuvastatin (CRESTOR) 40 MG tablet Take 1 tablet (40 mg total) by mouth daily. 90 tablet 3  ? sertraline (ZOLOFT) 50 MG tablet Take 50 mg by mouth daily.    ? tiotropium (SPIRIVA) 18 MCG inhalation capsule Place 18 mcg into inhaler and inhale daily.    ? amLODipine (NORVASC) 5 MG tablet Take 1 tablet (5 mg total) by mouth daily. 90 tablet 3  ? ?No facility-administered medications prior to visit.  ? ? ? ?Objective:  ?  ? ?BP 128/68 (BP Location: Left Arm, Patient Position: Sitting)   Pulse (!) 104   Temp 98.2 ?F (36.8 ?C) (Temporal)   Ht '5\' 4"'$  (1.626 m)   Wt 222 lb 6.4 oz (100.9 kg)   SpO2 95% Comment: ra  BMI 38.17 kg/m?  ? ?SpO2: 95 % (ra) ? ?Amb obese wf tremor/ halting speech and insp  ? ? ? HEENT : Oropharynx  clear  Nasal turbintes nl  ? ? ?NECK :  without  appent JVD/ palpable Nodes/TM  ? ? ?LUNGS: no acc muscle use,  Nl contour chest which is clear to A and P bilaterally without cough on insp or exp maneuvers ? ? ?CV:  RRR  no s3 or murmur or increase in P2, and no edema  ? ?ABD:  soft and nontender with nl inspiratory excursion in the supine position. No bruits or organomegaly appreciated  ? ?MS:  Nl gait/ ext warm  without deformities Or obvious joint restrictions  calf tenderness, cyanosis or clubbing  ?  ? ?SKIN: warm and dry without lesions   ? ?NEURO:  alert, approp, nl sensorium with  resting tremor and no motor or cerebellar deficit

## 2021-06-17 NOTE — Patient Instructions (Addendum)
Discuss your tremor with neurologist  ? ? ?Plan A = Automatic = Always=    Stiolto 2 puffs each am  takes place of symbicort and spiriva - ok to resume if not happy with progress ? ?Pace yourself when walking to help you walk farther but no need for 0xygen at this point ? ? ?Refer to sleep medicine  ? ?Please schedule a follow up visit in 3 months but call sooner if needed  ? ? ? ? ? ? ? ? ? ?    ?

## 2021-06-19 ENCOUNTER — Other Ambulatory Visit (INDEPENDENT_AMBULATORY_CARE_PROVIDER_SITE_OTHER): Payer: Self-pay

## 2021-06-19 DIAGNOSIS — K7460 Unspecified cirrhosis of liver: Secondary | ICD-10-CM

## 2021-06-19 NOTE — Telephone Encounter (Signed)
Patient aware of all and will call pcp regarding glucose.

## 2021-06-20 ENCOUNTER — Encounter (INDEPENDENT_AMBULATORY_CARE_PROVIDER_SITE_OTHER): Payer: Self-pay

## 2021-06-20 NOTE — Telephone Encounter (Signed)
RECEIVED DUPLICATE CLEARANCE: ONLY THING CHANGED IS THE PROCEDURE DATE WHICH IS NOW 07/29/21.   I did review with the pre op provider to be sure to just re-fax the clearance that was faxed on 06/11/21. Per Melina Copa, PAC ok to re-fax original clearance.

## 2021-06-23 ENCOUNTER — Encounter (HOSPITAL_COMMUNITY): Payer: Self-pay

## 2021-06-23 ENCOUNTER — Ambulatory Visit (HOSPITAL_COMMUNITY): Payer: 59

## 2021-06-26 ENCOUNTER — Ambulatory Visit (HOSPITAL_COMMUNITY): Admission: RE | Admit: 2021-06-26 | Payer: 59 | Source: Ambulatory Visit

## 2021-06-27 ENCOUNTER — Telehealth: Payer: Self-pay | Admitting: Internal Medicine

## 2021-06-27 DIAGNOSIS — U071 COVID-19: Secondary | ICD-10-CM

## 2021-06-27 MED ORDER — PREDNISONE 10 MG PO TABS: ORAL_TABLET | ORAL | 0 refills | Status: DC

## 2021-06-27 MED ORDER — MOLNUPIRAVIR EUA 200MG CAPSULE: 4.0000 | ORAL_CAPSULE | Freq: Two times a day (BID) | ORAL | 0 refills | Status: AC

## 2021-06-27 NOTE — Telephone Encounter (Signed)
Called pt and there was no answer so left a detailed msg on her machine ok per DPR.

## 2021-06-27 NOTE — Telephone Encounter (Signed)
ATC patient  

## 2021-06-27 NOTE — Telephone Encounter (Signed)
Primary Pulmonologist: Dr. Melvyn Novas  Last office visit and with whom: Dr. Melvyn Novas 06-17-2021 What do we see them for (pulmonary problems): Asthma  Last OV assessment/plan: see below   Was appointment offered to patient (explain)?  No covid +    Reason for call:  Covid + this morning.  Congestion, Cough with yellow sputum, nausea.  No fevers. [facility] [place], [place]. Wants to know if we can call something in to get her through the weekend until her PCP is back in office Per patient can leave a vm if if we cant reach her, she doesn't have good phone reception.   Sending to DOD since Dr. Melvyn Novas is off today. Please advise thanks!    Allergies  Allergen Reactions   Tape Other (See Comments)    Adhesive breaks me out (rash)   Band-Aid Infection Defense [Bacitracin-Polymyxin B] Other (See Comments)    unknown   Exenatide     Other reaction(s): Unknown   Iodinated Contrast Media Swelling   Ticagrelor     Other reaction(s): Unknown   Morphine Itching    Immunization History  Administered Date(s) Administered   Influenza,inj,Quad PF,6+ Mos 11/03/2016   Influenza-Unspecified 11/18/1996, 10/18/2018   Moderna Sars-Covid-2 Vaccination 05/04/2019, 06/08/2019   Pneumococcal Polysaccharide-23 06/04/2011, 11/19/2018   Tdap 06/15/1999   Zoster Recombinat (Shingrix) 11/19/2018    Assessment    Asthma, chronic, mild persistent, uncomplicated Passive smoker exp only - PFT's  08/15/14  FEV1 2.09 (84 % ) ratio 0.81  p 11 % improvement from saba p spiriva prior to study with DLCO  17.57 (76%) corrects to 115% or alv volume and FV curve min concae  and ERV 42% at wt 220  - 06/17/2021  After extensive coaching inhaler device,  effectiveness =    75% with smi > start stiolto 2 puffs each am @ wt 222   - 06/17/2021   Walked on 2.5  x  2.5  lap(s) =  approx 400  ft  @ mod pace, stopped due to sob/ cp/dizzy and legs hurt  with lowest 02 sats 97%    Not clear if she has any significant airflow ostruction at all and may  have more of a mild ACOS than chronic asthma which if favored over true copd.  Also, some of her symptoms may be from using DPI spiriva chronically so to test the theory try stiolto sample but if having any worsening / flares rec change back to symbicort in lower doses to avoid irritating the upper airway while still covering the possiblity of asthma adequately      OSA on CPAP Referred to sleep medicine  06/17/2021 >>>        Morbid obesity due to excess calories ([place]) Complicated by OSA/ hbp/ hyperlipidemia, and ERV 42% at at 220  08/15/14    Body mass index is 38.17 kg/m.  -       Lab Results  Component Value Date    TSH 12.392 (H) 12/02/2017        Contributing to doe and risk of GERD >>>   reviewed the need and the process to achieve and maintain neg calorie balance > defer f/u primary care including intermittently monitoring thyroid status        Each maintenance medication was reviewed in detail including emphasizing most importantly the difference between maintenance and prns and under what circumstances the prns are to be triggered using an action plan format where appropriate.   Total time for H and P,  chart review, counseling, reviewing hfa/smi device(s) , directly observing portions of ambulatory 02 saturation study/ and generating customized AVS unique to this office visit / same day charting = 50 min with pt new to me                       Christinia Gully, MD 06/17/2021         Assessment & Plan Note by Tanda Rockers, MD at 06/17/2021 3:17 PM  Author: Tanda Rockers, MD Author Type: Physician Filed: 06/17/2021  4:21 PM  Note Status: Bernell List: Cosign Not Required Encounter Date: 06/17/2021  Problem: Morbid obesity due to excess calories [facility])  Editor: Tanda Rockers, MD (Physician)      Prior Versions: 1. Tanda Rockers, MD (Physician) at 06/17/2021  4:20 PM - Edited   2. Tanda Rockers, MD (Physician) at 06/17/2021  3:17 PM - Written  Expand All Collapse  All Complicated by OSA/ hbp/ hyperlipidemia, and ERV 42% at at 220  08/15/14    Body mass index is 38.17 kg/m.  -  Recent Labs       Lab Results  Component Value Date    TSH 12.392 (H) 12/02/2017          Contributing to doe and risk of GERD >>>   reviewed the need and the process to achieve and maintain neg calorie balance > defer f/u primary care including intermittently monitoring thyroid status        Each maintenance medication was reviewed in detail including emphasizing most importantly the difference between maintenance and prns and under what circumstances the prns are to be triggered using an action plan format where appropriate.   Total time for H and P, chart review, counseling, reviewing hfa/smi device(s) , directly observing portions of ambulatory 02 saturation study/ and generating customized AVS unique to this office visit / same day charting = 50 min with pt new to me                         Assessment & Plan Note by Tanda Rockers, MD at 06/17/2021 3:11 PM  Author: Tanda Rockers, MD Author Type: Physician Filed: 06/17/2021  4:19 PM  Note Status: [place]ell List: Cosign Not Required Encounter Date: 06/17/2021  Problem: Asthma, chronic, mild persistent, uncomplicated  Editor: Tanda Rockers, MD (Physician)      Prior Versions: 1. Tanda Rockers, MD (Physician) at 06/17/2021  3:14 PM - Written  Passive smoker exp only - PFT's  08/15/14  FEV1 2.09 (84 % ) ratio 0.81  p 11 % improvement from saba p spiriva prior to study with DLCO  17.57 (76%) corrects to 115% or alv volume and FV curve min concae  and ERV 42% at wt 220  - 06/17/2021  After extensive coaching inhaler device,  effectiveness =    75% with smi > start stiolto 2 puffs each am @ wt 222   - 06/17/2021   Walked on 2.5  x  2.5  lap(s) =  approx 400  ft  @ mod pace, stopped due to sob/ cp/dizzy and legs hurt  with lowest 02 sats 97%    Not clear if she has any significant airflow ostruction at all and may  have more of a mild ACOS than chronic asthma which if favored over true copd.  Also, some of her symptoms may be from using DPI spiriva chronically so to  test the theory try stiolto sample but if having any worsening / flares rec change back to symbicort in lower doses to avoid irritating the upper airway while still covering the possiblity of asthma adequately           Patient Instructions by Tanda Rockers, MD at 06/17/2021 2:30 PM  Author: Tanda Rockers, MD Author Type: Physician Filed: 06/17/2021  3:24 PM  Note Status: Addendum Cosign: Cosign Not Required Encounter Date: 06/17/2021  Editor: Tanda Rockers, MD (Physician)      Prior Versions: 1. Tanda Rockers, MD (Physician) at 06/17/2021  3:05 PM - Signed  Discuss your tremor with neurologist      Plan A = Automatic = Always=    Stiolto 2 puffs each am  takes place of symbicort and spiriva - ok to resume if not happy with progress   Pace yourself when walking to help you walk farther but no need for 0xygen at this point     Refer to sleep medicine    Please schedule a follow up visit in 3 months but call sooner if needed                               Assessment & Plan Note by Tanda Rockers, MD at 06/17/2021 3:16 PM  Author: Tanda Rockers, MD Author Type: Physician Filed: 06/17/2021  3:16 PM  Note Status: Written Cosign: Cosign Not Required Encounter Date: 06/17/2021  Problem: OSA on CPAP  Editor: Tanda Rockers, MD (Physician)             Referred to sleep medicine  06/17/2021 >>>        Orthostatic Vitals Recorded in This Encounter   06/17/2021  1433     Patient Position: Sitting  BP Location: Left Arm

## 2021-06-27 NOTE — Telephone Encounter (Signed)
Please notify patient I sent an antiviral in for her. Molnupiravir 800 mg Twice daily for 5 days. Take with food. I also sent in a prednisone taper for her to have on hand over the weekend incase she develops increased SOB or wheezing. Advise her to take mucinex 600 mg Twice daily for congestion and delsym 2 tsp Twice daily for cough. Monitor oxygen for goal >88-90%. Seek emergency care if worsening symptoms develop.

## 2021-06-27 NOTE — Telephone Encounter (Signed)
ATC patient X2 LMTCB 

## 2021-07-04 ENCOUNTER — Ambulatory Visit (HOSPITAL_COMMUNITY)
Admission: RE | Admit: 2021-07-04 | Discharge: 2021-07-04 | Disposition: A | Discharge status: Home / Self Care | Payer: 59 | Source: Ambulatory Visit | Attending: Gastroenterology | Admitting: Gastroenterology

## 2021-07-04 DIAGNOSIS — K7460 Unspecified cirrhosis of liver: Secondary | ICD-10-CM | POA: Diagnosis present

## 2021-07-04 DIAGNOSIS — K7581 Nonalcoholic steatohepatitis (NASH): Secondary | ICD-10-CM | POA: Diagnosis not present

## 2021-07-16 ENCOUNTER — Encounter (INDEPENDENT_AMBULATORY_CARE_PROVIDER_SITE_OTHER): Payer: Self-pay

## 2021-07-23 NOTE — Patient Instructions (Signed)
[name]  07/23/2021     '@PREFPERIOPPHARMACY'$ @   Your procedure is scheduled on  07/29/2021.   Report to [facility] at  1115  A.M.   Call this number if you have problems the morning of surgery:  [phone]   Remember:  Follow the diet instructions given to you by the office.   Take 1/2 of your night time insulin the night before your procedure.     DO NOT take any medications for diabetes the morning of your procedure.       Use your inhaler before you come and bring your rescue inhaler with you.    Take these medicines the morning of surgery with A SIP OF WATER      norvasc, valium(if needed), gabapentin, hydroxyzine, levothyroxine, claritin, robaxin(if needed), metoprolol, oxycodone(If needed), protonix, zoloft.     Do not wear jewelry, make-up or nail polish.  Do not wear lotions, powders, or perfumes, or deodorant.  Do not shave 48 hours prior to surgery.  Men may shave face and neck.  Do not bring valuables to the hospital.  [facility] is not responsible for any belongings or valuables.  Contacts, dentures or bridgework may not be worn into surgery.  Leave your suitcase in the car.  After surgery it may be brought to your room.  For patients admitted to the hospital, discharge time will be determined by your treatment team.  Patients discharged the day of surgery will not be allowed to drive home and must have someone with them for 24 hours.    Special instructions:   DO NOT smoke tobacco or vape for 24 hours before your procedure.  Please read over the following fact sheets that you were given. Anesthesia Post-op Instructions and Care and Recovery After Surgery      Upper Endoscopy, Adult, Care After This sheet gives you information about how to care for yourself after your procedure. Your health care provider may also give you more specific instructions. If you have problems or questions, contact your health care provider. What can I expect  after the procedure? After the procedure, it is common to have: A sore throat. Mild stomach pain or discomfort. Bloating. Nausea. Follow these instructions at home:  Follow instructions from your health care provider about what to eat or drink after your procedure. Return to your normal activities as told by your health care provider. Ask your health care provider what activities are safe for you. Take over-the-counter and prescription medicines only as told by your health care provider. If you were given a sedative during the procedure, it can affect you for several hours. Do not drive or operate machinery until your health care provider says that it is safe. Keep all follow-up visits as told by your health care provider. This is important. Contact a health care provider if you have: A sore throat that lasts longer than one day. Trouble swallowing. Get help right away if: You vomit blood or your vomit looks like coffee grounds. You have: A fever. Bloody, black, or tarry stools. A severe sore throat or you cannot swallow. Difficulty breathing. Severe pain in your chest or abdomen. Summary After the procedure, it is common to have a sore throat, mild stomach discomfort, bloating, and nausea. If you were given a sedative during the procedure, it can affect you for several hours. Do not drive or operate machinery until your health care provider says that it is safe. Follow instructions from  your health care provider about what to eat or drink after your procedure. Return to your normal activities as told by your health care provider. This information is not intended to replace advice given to you by your health care provider. Make sure you discuss any questions you have with your health care provider. Document Revised: 11/25/2018 Document Reviewed: 06/21/2017 Elsevier Patient Education  [place] After This sheet gives you information about how  to care for yourself after your procedure. Your health care provider may also give you more specific instructions. If you have problems or questions, contact your health care provider. What can I expect after the procedure? After the procedure, it is common to have: Tiredness. Forgetfulness about what happened after the procedure. Impaired judgment for important decisions. Nausea or vomiting. Some difficulty with balance. Follow these instructions at home: For the time period you were told by your health care provider:     Rest as needed. Do not participate in activities where you could fall or become injured. Do not drive or use machinery. Do not drink alcohol. Do not take sleeping pills or medicines that cause drowsiness. Do not make important decisions or sign legal documents. Do not take care of children on your own. Eating and drinking Follow the diet that is recommended by your health care provider. Drink enough fluid to keep your urine pale yellow. If you vomit: Drink water, juice, or soup when you can drink without vomiting. Make sure you have little or no nausea before eating solid foods. General instructions Have a responsible adult stay with you for the time you are told. It is important to have someone help care for you until you are awake and alert. Take over-the-counter and prescription medicines only as told by your health care provider. If you have sleep apnea, surgery and certain medicines can increase your risk for breathing problems. Follow instructions from your health care provider about wearing your sleep device: Anytime you are sleeping, including during daytime naps. While taking prescription pain medicines, sleeping medicines, or medicines that make you drowsy. Avoid smoking. Keep all follow-up visits as told by your health care provider. This is important. Contact a health care provider if: You keep feeling nauseous or you keep vomiting. You feel  light-headed. You are still sleepy or having trouble with balance after 24 hours. You develop a rash. You have a fever. You have redness or swelling around the IV site. Get help right away if: You have trouble breathing. You have new-onset confusion at home. Summary For several hours after your procedure, you may feel tired. You may also be forgetful and have poor judgment. Have a responsible adult stay with you for the time you are told. It is important to have someone help care for you until you are awake and alert. Rest as told. Do not drive or operate machinery. Do not drink alcohol or take sleeping pills. Get help right away if you have trouble breathing, or if you suddenly become confused. This information is not intended to replace advice given to you by your health care provider. Make sure you discuss any questions you have with your health care provider. Document Revised: 12/24/2020 Document Reviewed: 12/22/2018 Elsevier Patient Education  [place].

## 2021-07-25 ENCOUNTER — Encounter (HOSPITAL_COMMUNITY)
Admission: RE | Admit: 2021-07-25 | Discharge: 2021-07-25 | Disposition: A | Discharge status: Home / Self Care | Payer: 59 | Source: Ambulatory Visit | Attending: Gastroenterology | Admitting: Gastroenterology

## 2021-07-25 DIAGNOSIS — Z01812 Encounter for preprocedural laboratory examination: Secondary | ICD-10-CM | POA: Diagnosis present

## 2021-07-25 DIAGNOSIS — K7460 Unspecified cirrhosis of liver: Secondary | ICD-10-CM | POA: Diagnosis not present

## 2021-07-25 DIAGNOSIS — K7581 Nonalcoholic steatohepatitis (NASH): Secondary | ICD-10-CM | POA: Diagnosis not present

## 2021-07-25 LAB — BASIC METABOLIC PANEL
Anion gap: 9 (ref 5–15)
BUN: 22 mg/dL (ref 8–23)
CO2: 26 mmol/L (ref 22–32)
Calcium: 8.8 mg/dL — ABNORMAL LOW (ref 8.9–10.3)
Chloride: 103 mmol/L (ref 98–111)
Creatinine, Ser: 1.28 mg/dL — ABNORMAL HIGH (ref 0.44–1.00)
GFR, Estimated: 46 mL/min — ABNORMAL LOW (ref >60)
Glucose, Bld: 346 mg/dL — ABNORMAL HIGH (ref 70–99)
Potassium: 4.3 mmol/L (ref 3.5–5.1)
Sodium: 138 mmol/L (ref 135–145)

## 2021-07-28 ENCOUNTER — Encounter: Payer: 59 | Attending: Gastroenterology | Admitting: Nutrition

## 2021-07-28 VITALS — Ht 63.0 in | Wt 226.0 lb

## 2021-07-28 DIAGNOSIS — K7460 Unspecified cirrhosis of liver: Secondary | ICD-10-CM | POA: Diagnosis not present

## 2021-07-28 DIAGNOSIS — K7581 Nonalcoholic steatohepatitis (NASH): Secondary | ICD-10-CM | POA: Insufficient documentation

## 2021-07-28 DIAGNOSIS — I10 Essential (primary) hypertension: Secondary | ICD-10-CM

## 2021-07-28 DIAGNOSIS — E118 Type 2 diabetes mellitus with unspecified complications: Secondary | ICD-10-CM

## 2021-07-28 DIAGNOSIS — I25110 Atherosclerotic heart disease of native coronary artery with unstable angina pectoris: Secondary | ICD-10-CM

## 2021-07-28 DIAGNOSIS — Z713 Dietary counseling and surveillance: Secondary | ICD-10-CM | POA: Insufficient documentation

## 2021-07-28 DIAGNOSIS — E782 Mixed hyperlipidemia: Secondary | ICD-10-CM

## 2021-07-28 DIAGNOSIS — E119 Type 2 diabetes mellitus without complications: Secondary | ICD-10-CM | POA: Insufficient documentation

## 2021-07-28 NOTE — Progress Notes (Signed)
Medical Nutrition Therapy  Appointment Start time:  47  Appointment End time:  1145  Primary concerns today: NASH, DM, Obesity  Referral diagnosis: E73.03 Preferred learning style: no preferences Learning readiness: Ready    NUTRITION ASSESSMENT  66 yr old wfemale her for diabetes type 2, NASH, Obesity and s/p CVA. PMH: CAD, s/p andioplasty, HNT, CAD, OSA, GERD, LIver cirrhosis, Hyperlipidemia, CRI,  Had a stroke in NOV 2022.  GI DR.Montez Morita For NASH  Able to chew, swallow, walk and talk fine. Weakness on right side. Has numbness from mid thigh and below the knee. Had eye surgery and is recovering from that. Having a EGD tomorrow and maybe taking a biopsy of the liver per her memory. Says sometimes she can't remember things.  FBS: 100-150's, Before lunch 200's Before dinner- 300's and bedtime: 175-200's Medications for DM: SYNjardy, Tresiba  75 units in am and 15 units at night. Humalog 5-20 units with meals. Allergic to the LIbre-broke her skin out and cause rash.  Diet is high in fat, salt and processed foods contributing to her DM, NASH, Obesity, CAD and hyperlipidemia.  She is willing to work on eating healthier and improve her blood sugars and lose weight to help with her NASH.    Anthropometrics  Wt Readings from Last 3 Encounters:  07/28/21 226 lb (102.5 kg)  07/25/21 222 lb (100.7 kg)  06/17/21 222 lb 6.4 oz (100.9 kg)   Ht Readings from Last 3 Encounters:  07/28/21 '5\' 3"'$  (1.6 m)  07/25/21 '5\' 4"'$  (1.626 m)  06/17/21 '5\' 4"'$  (1.626 m)   Body mass index is 40.03 kg/m. '@BMIFA'$ @ Facility age limit for growth %iles is 20 years. Facility age limit for growth %iles is 20 years.    Clinical Medical Hx: See chart Medications: Tresiba 15 units at night and 75 units in am. Humalog with meals on sliding scale.  Labs: Last A1C she thinks last week was 7.6%.     Latest Ref Rng & Units 07/25/2021   10:45 AM 06/05/2021    2:01 PM 02/19/2021   10:09 AM  CMP   Glucose 70 - 99 mg/dL 346  417  310   BUN 8 - 23 mg/dL '22  18  30   '$ Creatinine 0.44 - 1.00 mg/dL 1.28  1.20  1.18   Sodium 135 - 145 mmol/L 138  140  139   Potassium 3.5 - 5.1 mmol/L 4.3  4.4  4.8   Chloride 98 - 111 mmol/L 103  101  100   CO2 22 - 32 mmol/L '26  30  28   '$ Calcium 8.9 - 10.3 mg/dL 8.8  8.7  9.4   Total Protein 6.1 - 8.1 g/dL  6.1    Total Bilirubin 0.2 - 1.2 mg/dL  0.5    AST 10 - 35 U/L  36    ALT 6 - 29 U/L  33    Lipid Panel     Component Value Date/Time   CHOL 207 (H) 12/10/2020 0059   TRIG 233 (H) 12/10/2020 0059   HDL 52 12/10/2020 0059   CHOLHDL 4.0 12/10/2020 0059   VLDL 47 (H) 12/10/2020 0059   LDLCALC 108 (H) 12/10/2020 0059      Notable Signs/Symptoms: Chronic fatigue, no energy,   Lifestyle & Dietary Hx LIves by herself. Unable to carry groceries in her house and shop. Orders her groceries for delievery. Suppose to be getting a CNA. Eats most meals at home.  Estimated daily fluid intake: 100  oz Supplements:  Sleep: 6 hours Stress / self-care: none Current average weekly physical activity: ADL  24-Hr Dietary Recall First Meal:  7am Applesauce and cinnamon toast- 2,water Snack: Boost Glucose control. Second Meal: tomato sandwich on white bread, mayonaise, water Snack: nabs Third Meal: Pasta salad 3/4 c,  Snack: Boost glucose control Beverages: water and Boost Glucose control  Estimated Energy Needs Calories: 1200  Carbohydrate: 135g Protein: 90g Fat: 33g   NUTRITION DIAGNOSIS  NB-1.1 Food and nutrition-related knowledge deficit As related to Diabetes Type 2.  As evidenced by A1C 7.6%.   NUTRITION INTERVENTION  Nutrition education (E-1) on the following topics:  Nutrition and Diabetes education provided on My Plate, CHO counting, meal planning, portion sizes, timing of meals, avoiding snacks between meals unless having a low blood sugar, target ranges for A1C and blood sugars, signs/symptoms and treatment of hyper/hypoglycemia,  monitoring blood sugars, taking medications as prescribed, benefits of exercising 30 minutes per day and prevention of complications of DM.  Lifestyle Medicine  - Whole Food, Plant Predominant Nutrition is highly recommended: Eat Plenty of vegetables, Mushrooms, fruits, Legumes, Whole Grains, Nuts, seeds in lieu of processed meats, processed snacks/pastries red meat, poultry, eggs.    -It is better to avoid simple carbohydrates including: Cakes, Sweet Desserts, Ice Cream, Soda (diet and regular), Sweet Tea, Candies, Chips, Cookies, Store Bought Juices, Alcohol in Excess of  1-2 drinks a day, Lemonade,  Artificial Sweeteners, Doughnuts, Coffee Creamers, "Sugar-free" Products, etc, etc.  This is not a complete list.....  Exercise: If you are able: 30 -60 minutes a day ,4 days a week, or 150 minutes a week.  The longer the better.  Combine stretch, strength, and aerobic activities.  If you were told in the past that you have high risk for cardiovascular diseases, you may seek evaluation by your heart doctor prior to initiating moderate to intense exercise programs.   Handouts Provided Include  Meal Plan Card Lifestyle Medicine  Learning Style & Readiness for Change Teaching method utilized: Visual & Auditory  Demonstrated degree of understanding via: Teach Back  Barriers to learning/adherence to lifestyle change: None  Goals Established by Pt Goals  Choose iron rich foods instead of boost for iron Eat three meals a day at times discussed. Drink only water Cut out the juice. Change to whole wheat bread and whole wheat pasta Measure foods out  Follow the Plate Method-Focus on more plant based whole foods Cut out processed and fast foods Test blood sugars 4 times per day Keep food journal and BS log and bring at next visit with meter.   MONITORING & EVALUATION Dietary intake, weekly physical activity, and weight and Blood sugars in 1 month.  Next Steps  Patient is to work on better  meal planning and meal prepping.Marland Kitchen

## 2021-07-29 ENCOUNTER — Ambulatory Visit (HOSPITAL_COMMUNITY)
Admission: RE | Admit: 2021-07-29 | Discharge: 2021-07-29 | Disposition: A | Discharge status: Home / Self Care | Payer: 59 | Source: Ambulatory Visit | Attending: Gastroenterology | Admitting: Gastroenterology

## 2021-07-29 ENCOUNTER — Ambulatory Visit (HOSPITAL_COMMUNITY): Payer: 59 | Admitting: Anesthesiology

## 2021-07-29 ENCOUNTER — Encounter (HOSPITAL_COMMUNITY)
Admission: RE | Disposition: A | Discharge status: Home / Self Care | Payer: Self-pay | Source: Ambulatory Visit | Attending: Gastroenterology

## 2021-07-29 ENCOUNTER — Ambulatory Visit (HOSPITAL_BASED_OUTPATIENT_CLINIC_OR_DEPARTMENT_OTHER): Payer: 59 | Admitting: Anesthesiology

## 2021-07-29 DIAGNOSIS — M542 Cervicalgia: Secondary | ICD-10-CM | POA: Insufficient documentation

## 2021-07-29 DIAGNOSIS — K3189 Other diseases of stomach and duodenum: Secondary | ICD-10-CM

## 2021-07-29 DIAGNOSIS — K7460 Unspecified cirrhosis of liver: Secondary | ICD-10-CM | POA: Insufficient documentation

## 2021-07-29 DIAGNOSIS — I8510 Secondary esophageal varices without bleeding: Secondary | ICD-10-CM | POA: Diagnosis not present

## 2021-07-29 DIAGNOSIS — I2510 Atherosclerotic heart disease of native coronary artery without angina pectoris: Secondary | ICD-10-CM | POA: Diagnosis not present

## 2021-07-29 DIAGNOSIS — K219 Gastro-esophageal reflux disease without esophagitis: Secondary | ICD-10-CM | POA: Insufficient documentation

## 2021-07-29 DIAGNOSIS — F419 Anxiety disorder, unspecified: Secondary | ICD-10-CM | POA: Insufficient documentation

## 2021-07-29 DIAGNOSIS — Z7902 Long term (current) use of antithrombotics/antiplatelets: Secondary | ICD-10-CM | POA: Diagnosis not present

## 2021-07-29 DIAGNOSIS — Z13810 Encounter for screening for upper gastrointestinal disorder: Secondary | ICD-10-CM

## 2021-07-29 DIAGNOSIS — M549 Dorsalgia, unspecified: Secondary | ICD-10-CM | POA: Diagnosis not present

## 2021-07-29 DIAGNOSIS — G4730 Sleep apnea, unspecified: Secondary | ICD-10-CM | POA: Diagnosis not present

## 2021-07-29 DIAGNOSIS — I5030 Unspecified diastolic (congestive) heart failure: Secondary | ICD-10-CM | POA: Insufficient documentation

## 2021-07-29 DIAGNOSIS — E785 Hyperlipidemia, unspecified: Secondary | ICD-10-CM | POA: Insufficient documentation

## 2021-07-29 DIAGNOSIS — K766 Portal hypertension: Secondary | ICD-10-CM | POA: Diagnosis not present

## 2021-07-29 DIAGNOSIS — D175 Benign lipomatous neoplasm of intra-abdominal organs: Secondary | ICD-10-CM | POA: Diagnosis not present

## 2021-07-29 DIAGNOSIS — E039 Hypothyroidism, unspecified: Secondary | ICD-10-CM | POA: Diagnosis not present

## 2021-07-29 DIAGNOSIS — Z85038 Personal history of other malignant neoplasm of large intestine: Secondary | ICD-10-CM | POA: Diagnosis not present

## 2021-07-29 DIAGNOSIS — I252 Old myocardial infarction: Secondary | ICD-10-CM

## 2021-07-29 DIAGNOSIS — K317 Polyp of stomach and duodenum: Secondary | ICD-10-CM

## 2021-07-29 DIAGNOSIS — E1140 Type 2 diabetes mellitus with diabetic neuropathy, unspecified: Secondary | ICD-10-CM | POA: Insufficient documentation

## 2021-07-29 DIAGNOSIS — Z9049 Acquired absence of other specified parts of digestive tract: Secondary | ICD-10-CM | POA: Insufficient documentation

## 2021-07-29 DIAGNOSIS — I10 Essential (primary) hypertension: Secondary | ICD-10-CM

## 2021-07-29 DIAGNOSIS — I110 Hypertensive heart disease with heart failure: Secondary | ICD-10-CM | POA: Insufficient documentation

## 2021-07-29 DIAGNOSIS — J45909 Unspecified asthma, uncomplicated: Secondary | ICD-10-CM | POA: Insufficient documentation

## 2021-07-29 DIAGNOSIS — Z79891 Long term (current) use of opiate analgesic: Secondary | ICD-10-CM | POA: Diagnosis not present

## 2021-07-29 HISTORY — PX: ESOPHAGOGASTRODUODENOSCOPY (EGD) WITH PROPOFOL: SHX5813

## 2021-07-29 HISTORY — PX: BIOPSY: SHX5522

## 2021-07-29 LAB — GLUCOSE, CAPILLARY: Glucose-Capillary: 190 mg/dL — ABNORMAL HIGH (ref 70–99)

## 2021-07-29 SURGERY — ESOPHAGOGASTRODUODENOSCOPY (EGD) WITH PROPOFOL
Anesthesia: General

## 2021-07-29 MED ORDER — PROPOFOL 500 MG/50ML IV EMUL
INTRAVENOUS | Status: DC | PRN
  Administered 2021-07-29: 80 ug/kg/min via INTRAVENOUS

## 2021-07-29 MED ORDER — LACTATED RINGERS IV SOLN: INTRAVENOUS | Status: DC

## 2021-07-29 MED ORDER — LIDOCAINE HCL (CARDIAC) PF 50 MG/5ML IV SOSY
PREFILLED_SYRINGE | INTRAVENOUS | Status: DC | PRN
  Administered 2021-07-29: 50 mg via INTRAVENOUS

## 2021-07-29 NOTE — Transfer of Care (Signed)
Immediate Anesthesia Transfer of Care Note  Patient: [name]  Procedure(s) Performed: ESOPHAGOGASTRODUODENOSCOPY (EGD) WITH PROPOFOL BIOPSY  Patient Location: Short Stay  Anesthesia Type:General  Level of Consciousness: awake  Airway & Oxygen Therapy: Patient Spontanous Breathing  Post-op Assessment: Report given to RN and Post -op Vital signs reviewed and stable  Post vital signs: Reviewed and stable  Last Vitals:  Vitals Value Taken Time  BP 95/61 1400  Temp 97.3 1400  Pulse 72 1400  Resp 16 1400  SpO2 99 1400   Last Pain:  Vitals:   07/29/21 1339  PainSc: 7          Complications: No notable events documented.

## 2021-07-31 LAB — SURGICAL PATHOLOGY

## 2021-08-01 NOTE — Anesthesia Postprocedure Evaluation (Signed)
Anesthesia Post Note  Patient: BARBIE CROSTON  Procedure(s) Performed: ESOPHAGOGASTRODUODENOSCOPY (EGD) WITH PROPOFOL BIOPSY  Patient location during evaluation: Phase II Anesthesia Type: General Level of consciousness: awake Pain management: pain level controlled Vital Signs Assessment: post-procedure vital signs reviewed and stable Respiratory status: spontaneous breathing and respiratory function stable Cardiovascular status: blood pressure returned to baseline and stable Postop Assessment: no headache and no apparent nausea or vomiting Anesthetic complications: no Comments: Late entry   No notable events documented.   Last Vitals:  Vitals:   07/29/21 1130 07/29/21 1357  BP:  95/61  Pulse: 69 72  Resp: (!) 21 18  Temp:  (!) 36.3 C  SpO2: 96% 98%    Last Pain:  Vitals:   07/29/21 1357  TempSrc: Oral  PainSc: [place]

## 2021-08-04 ENCOUNTER — Encounter (HOSPITAL_COMMUNITY): Payer: Self-pay | Admitting: Gastroenterology

## 2021-08-14 ENCOUNTER — Institutional Professional Consult (permissible substitution): Payer: 59 | Admitting: Pulmonary Disease

## 2021-08-18 ENCOUNTER — Telehealth: Payer: Self-pay | Admitting: Nutrition

## 2021-08-18 NOTE — Telephone Encounter (Signed)
Vm to c/b

## 2021-08-21 ENCOUNTER — Other Ambulatory Visit: Payer: Self-pay | Admitting: Interventional Cardiology

## 2021-08-21 DIAGNOSIS — R079 Chest pain, unspecified: Secondary | ICD-10-CM

## 2021-08-21 DIAGNOSIS — Z79899 Other long term (current) drug therapy: Secondary | ICD-10-CM

## 2021-08-21 DIAGNOSIS — I25110 Atherosclerotic heart disease of native coronary artery with unstable angina pectoris: Secondary | ICD-10-CM

## 2021-08-21 DIAGNOSIS — I6381 Other cerebral infarction due to occlusion or stenosis of small artery: Secondary | ICD-10-CM

## 2021-08-21 DIAGNOSIS — E785 Hyperlipidemia, unspecified: Secondary | ICD-10-CM

## 2021-08-21 DIAGNOSIS — I10 Essential (primary) hypertension: Secondary | ICD-10-CM

## 2021-08-21 DIAGNOSIS — Z8673 Personal history of transient ischemic attack (TIA), and cerebral infarction without residual deficits: Secondary | ICD-10-CM

## 2021-08-28 ENCOUNTER — Ambulatory Visit (INDEPENDENT_AMBULATORY_CARE_PROVIDER_SITE_OTHER): Payer: 59 | Admitting: Gastroenterology

## 2021-08-28 ENCOUNTER — Encounter (INDEPENDENT_AMBULATORY_CARE_PROVIDER_SITE_OTHER): Payer: Self-pay | Admitting: Gastroenterology

## 2021-08-28 VITALS — BP 143/80 | HR 103 | Temp 98.1°F | Ht 63.0 in | Wt 218.2 lb

## 2021-08-28 DIAGNOSIS — K7460 Unspecified cirrhosis of liver: Secondary | ICD-10-CM

## 2021-08-28 DIAGNOSIS — K7581 Nonalcoholic steatohepatitis (NASH): Secondary | ICD-10-CM

## 2021-08-28 DIAGNOSIS — D509 Iron deficiency anemia, unspecified: Secondary | ICD-10-CM | POA: Diagnosis not present

## 2021-08-28 DIAGNOSIS — K439 Ventral hernia without obstruction or gangrene: Secondary | ICD-10-CM | POA: Diagnosis not present

## 2021-08-28 DIAGNOSIS — K5900 Constipation, unspecified: Secondary | ICD-10-CM
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
## 2021-08-28 NOTE — Patient Instructions (Signed)
-  I Would like to get you scheduled for colonscopy given your low iron -Please continue iron pills -Make me aware of worsening fatigue, sob, dizziness, rectal bleeding or episodes of passing out   - please try to get physical activity as you are able  - Reduce salt intake to <2 g per day - Can take Tylenol max of 2 g per day (650 mg q8h) for pain - Avoid NSAIDs for pain - Avoid eating raw oysters/shellfish - Ensure every night before going to sleep -.Start taking Miralax 1 capful every day for one week. If bowel movements do not improve, increase to 1 capful every 12 hours. If after two weeks there is no improvement, increase to 1 capful every 8 hours -let me know if you would like referral to general surgery here in [place] -Continue to follow with nutritionist -will repeat liver labs and [place] in November   Follow up 4 months

## 2021-09-02 ENCOUNTER — Encounter: Payer: 59 | Attending: Nurse Practitioner | Admitting: Nutrition

## 2021-09-17 ENCOUNTER — Ambulatory Visit: Payer: 59 | Admitting: Internal Medicine
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
## 2021-09-23 ENCOUNTER — Ambulatory Visit: Payer: 59 | Admitting: Neurology

## 2021-10-27 ENCOUNTER — Ambulatory Visit: Payer: 59 | Admitting: Nurse Practitioner

## 2021-10-29 ENCOUNTER — Telehealth (INDEPENDENT_AMBULATORY_CARE_PROVIDER_SITE_OTHER): Payer: Self-pay

## 2021-10-29 NOTE — Telephone Encounter (Signed)
I called and left [name] a voicemail on 08/28/21 and one on 09/10/21 to schedule a screening colonoscopy and have not heard back yet

## 2021-11-17 ENCOUNTER — Other Ambulatory Visit: Payer: Self-pay | Admitting: Urology

## 2021-11-17 DIAGNOSIS — N3021 Other chronic cystitis with hematuria: Secondary | ICD-10-CM

## 2021-11-27 ENCOUNTER — Ambulatory Visit: Payer: 59 | Admitting: Nurse Practitioner

## 2021-11-27 NOTE — Patient Instructions (Incomplete)

## 2021-12-06 ENCOUNTER — Other Ambulatory Visit: Payer: Self-pay | Admitting: Interventional Cardiology

## 2021-12-08 ENCOUNTER — Ambulatory Visit (INDEPENDENT_AMBULATORY_CARE_PROVIDER_SITE_OTHER): Payer: 59 | Admitting: Gastroenterology

## 2022-01-22 ENCOUNTER — Ambulatory Visit (INDEPENDENT_AMBULATORY_CARE_PROVIDER_SITE_OTHER): Payer: 59 | Admitting: Gastroenterology

## 2022-02-12 ENCOUNTER — Other Ambulatory Visit: Payer: Self-pay | Admitting: Physician Assistant

## 2022-02-18 ENCOUNTER — Encounter: Payer: Self-pay | Admitting: Nutrition
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
## 2022-02-24 ENCOUNTER — Ambulatory Visit: Payer: 59 | Admitting: Neurology

## 2022-03-09 ENCOUNTER — Ambulatory Visit: Payer: 59 | Admitting: Cardiovascular Disease
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
## 2022-03-11 ENCOUNTER — Ambulatory Visit: Payer: 59 | Admitting: Cardiovascular Disease

## 2022-03-26 ENCOUNTER — Ambulatory Visit (INDEPENDENT_AMBULATORY_CARE_PROVIDER_SITE_OTHER): Payer: 59 | Admitting: Gastroenterology

## 2022-05-13 ENCOUNTER — Other Ambulatory Visit: Payer: Self-pay

## 2022-05-13 MED ORDER — AMLODIPINE BESYLATE 5 MG PO TABS: 5.0000 mg | ORAL_TABLET | Freq: Every day | ORAL | 1 refills | Status: DC

## 2022-06-03 ENCOUNTER — Other Ambulatory Visit: Payer: Self-pay

## 2022-06-03 MED ORDER — ROSUVASTATIN CALCIUM 40 MG PO TABS: 40.0000 mg | ORAL_TABLET | Freq: Every day | ORAL | 1 refills | Status: DC
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
## 2022-06-24 ENCOUNTER — Ambulatory Visit: Payer: 59 | Admitting: Neurology

## 2022-07-15 ENCOUNTER — Encounter: Payer: Self-pay | Admitting: Cardiovascular Disease

## 2022-09-21 ENCOUNTER — Ambulatory Visit: Payer: 59 | Admitting: Physician Assistant

## 2022-10-02 ENCOUNTER — Ambulatory Visit: Payer: 59 | Admitting: Cardiovascular Disease

## 2022-10-02 ENCOUNTER — Other Ambulatory Visit: Payer: Self-pay

## 2022-10-02 DIAGNOSIS — I25110 Atherosclerotic heart disease of native coronary artery with unstable angina pectoris: Secondary | ICD-10-CM

## 2022-10-02 DIAGNOSIS — I10 Essential (primary) hypertension: Secondary | ICD-10-CM

## 2022-10-02 DIAGNOSIS — E785 Hyperlipidemia, unspecified: Secondary | ICD-10-CM

## 2022-10-02 DIAGNOSIS — I6381 Other cerebral infarction due to occlusion or stenosis of small artery: Secondary | ICD-10-CM

## 2022-10-02 DIAGNOSIS — Z79899 Other long term (current) drug therapy: Secondary | ICD-10-CM

## 2022-10-02 DIAGNOSIS — R079 Chest pain, unspecified: Secondary | ICD-10-CM

## 2022-10-02 DIAGNOSIS — Z8673 Personal history of transient ischemic attack (TIA), and cerebral infarction without residual deficits: Secondary | ICD-10-CM

## 2022-10-02 MED ORDER — FUROSEMIDE 40 MG PO TABS: 60.0000 mg | ORAL_TABLET | Freq: Every day | ORAL | 1 refills | Status: DC

## 2022-10-28 ENCOUNTER — Other Ambulatory Visit: Payer: Self-pay | Admitting: Cardiovascular Disease

## 2022-10-28 DIAGNOSIS — I6381 Other cerebral infarction due to occlusion or stenosis of small artery: Secondary | ICD-10-CM

## 2022-10-28 DIAGNOSIS — Z79899 Other long term (current) drug therapy: Secondary | ICD-10-CM

## 2022-10-28 DIAGNOSIS — I25110 Atherosclerotic heart disease of native coronary artery with unstable angina pectoris: Secondary | ICD-10-CM

## 2022-10-28 DIAGNOSIS — E785 Hyperlipidemia, unspecified: Secondary | ICD-10-CM

## 2022-10-28 DIAGNOSIS — Z8673 Personal history of transient ischemic attack (TIA), and cerebral infarction without residual deficits: Secondary | ICD-10-CM

## 2022-10-28 DIAGNOSIS — R079 Chest pain, unspecified: Secondary | ICD-10-CM

## 2022-10-28 DIAGNOSIS — I10 Essential (primary) hypertension: Secondary | ICD-10-CM

## 2022-11-14 ENCOUNTER — Other Ambulatory Visit: Payer: Self-pay | Admitting: Cardiovascular Disease

## 2022-11-14 ENCOUNTER — Other Ambulatory Visit: Payer: Self-pay | Admitting: Urology

## 2022-11-14 DIAGNOSIS — N3021 Other chronic cystitis with hematuria: Secondary | ICD-10-CM

## 2022-11-18 ENCOUNTER — Ambulatory Visit: Payer: 59 | Admitting: Cardiovascular Disease

## 2022-11-27 ENCOUNTER — Other Ambulatory Visit: Payer: Self-pay | Admitting: Cardiovascular Disease

## 2022-11-27 DIAGNOSIS — R079 Chest pain, unspecified: Secondary | ICD-10-CM

## 2022-11-27 DIAGNOSIS — E785 Hyperlipidemia, unspecified: Secondary | ICD-10-CM

## 2022-11-27 DIAGNOSIS — I10 Essential (primary) hypertension: Secondary | ICD-10-CM

## 2022-11-27 DIAGNOSIS — Z79899 Other long term (current) drug therapy: Secondary | ICD-10-CM

## 2022-11-27 DIAGNOSIS — Z8673 Personal history of transient ischemic attack (TIA), and cerebral infarction without residual deficits: Secondary | ICD-10-CM

## 2022-11-27 DIAGNOSIS — I25110 Atherosclerotic heart disease of native coronary artery with unstable angina pectoris: Secondary | ICD-10-CM

## 2022-11-27 DIAGNOSIS — I6381 Other cerebral infarction due to occlusion or stenosis of small artery: Secondary | ICD-10-CM

## 2022-12-02 ENCOUNTER — Other Ambulatory Visit: Payer: Self-pay | Admitting: Cardiovascular Disease

## 2022-12-16 ENCOUNTER — Other Ambulatory Visit: Payer: Self-pay | Admitting: Cardiovascular Disease

## 2022-12-29 ENCOUNTER — Other Ambulatory Visit: Payer: Self-pay | Admitting: Cardiovascular Disease

## 2022-12-29 DIAGNOSIS — Z8673 Personal history of transient ischemic attack (TIA), and cerebral infarction without residual deficits: Secondary | ICD-10-CM

## 2022-12-29 DIAGNOSIS — I25110 Atherosclerotic heart disease of native coronary artery with unstable angina pectoris: Secondary | ICD-10-CM

## 2022-12-29 DIAGNOSIS — Z79899 Other long term (current) drug therapy: Secondary | ICD-10-CM

## 2022-12-29 DIAGNOSIS — I10 Essential (primary) hypertension: Secondary | ICD-10-CM

## 2022-12-29 DIAGNOSIS — I6381 Other cerebral infarction due to occlusion or stenosis of small artery: Secondary | ICD-10-CM

## 2022-12-29 DIAGNOSIS — R079 Chest pain, unspecified: Secondary | ICD-10-CM

## 2022-12-29 DIAGNOSIS — E785 Hyperlipidemia, unspecified: Secondary | ICD-10-CM

## 2022-12-30 ENCOUNTER — Ambulatory Visit: Payer: 59 | Admitting: Cardiovascular Disease

## 2022-12-31 ENCOUNTER — Other Ambulatory Visit: Payer: Self-pay | Admitting: Cardiovascular Disease

## 2023-03-03 ENCOUNTER — Other Ambulatory Visit: Payer: Self-pay | Admitting: Physician Assistant

## 2023-03-03 ENCOUNTER — Ambulatory Visit: Payer: 59 | Admitting: Nurse Practitioner

## 2023-03-07 ENCOUNTER — Other Ambulatory Visit: Payer: Self-pay | Admitting: Cardiovascular Disease

## 2023-03-09 NOTE — Telephone Encounter (Signed)
This is a former Dr. Katrinka Fields Pt. Now she is with Dr. Clifton James. She has cancelled or NoShow'd numerous appts. She has a followup scheduled for May 2025. Does Dr. Clifton James want to refill or should the Pt be seen first? Please advise.

## 2023-03-11 NOTE — Telephone Encounter (Signed)
 Patient has not been seen in cardiology since March 2023.  She may be getting medication from primary care provider.   Not appropriate for refill at this time.  We can fill when she comes for appointment.

## 2023-04-01 ENCOUNTER — Other Ambulatory Visit: Payer: Self-pay | Admitting: Cardiovascular Disease

## 2023-04-01 DIAGNOSIS — I25110 Atherosclerotic heart disease of native coronary artery with unstable angina pectoris: Secondary | ICD-10-CM

## 2023-04-01 DIAGNOSIS — I10 Essential (primary) hypertension: Secondary | ICD-10-CM

## 2023-04-01 DIAGNOSIS — R079 Chest pain, unspecified: Secondary | ICD-10-CM

## 2023-04-01 DIAGNOSIS — Z79899 Other long term (current) drug therapy: Secondary | ICD-10-CM

## 2023-04-01 DIAGNOSIS — I6381 Other cerebral infarction due to occlusion or stenosis of small artery: Secondary | ICD-10-CM

## 2023-04-01 DIAGNOSIS — E785 Hyperlipidemia, unspecified: Secondary | ICD-10-CM

## 2023-04-01 DIAGNOSIS — Z8673 Personal history of transient ischemic attack (TIA), and cerebral infarction without residual deficits: Secondary | ICD-10-CM

## 2023-04-01 MED ORDER — FUROSEMIDE 40 MG PO TABS: 60.0000 mg | ORAL_TABLET | Freq: Every day | ORAL | 0 refills | Status: DC

## 2023-05-05 ENCOUNTER — Encounter (INDEPENDENT_AMBULATORY_CARE_PROVIDER_SITE_OTHER): Payer: Self-pay | Admitting: *Deleted

## 2023-05-31 ENCOUNTER — Ambulatory Visit (INDEPENDENT_AMBULATORY_CARE_PROVIDER_SITE_OTHER): Admitting: Gastroenterology

## 2023-05-31 ENCOUNTER — Encounter (INDEPENDENT_AMBULATORY_CARE_PROVIDER_SITE_OTHER): Payer: Self-pay | Admitting: Gastroenterology

## 2023-05-31 VITALS — BP 148/83 | HR 67 | Temp 97.3°F | Ht 64.0 in | Wt 217.2 lb

## 2023-05-31 DIAGNOSIS — K7460 Unspecified cirrhosis of liver: Secondary | ICD-10-CM | POA: Diagnosis not present

## 2023-05-31 DIAGNOSIS — K439 Ventral hernia without obstruction or gangrene: Secondary | ICD-10-CM | POA: Diagnosis not present

## 2023-05-31 DIAGNOSIS — Z85038 Personal history of other malignant neoplasm of large intestine: Secondary | ICD-10-CM | POA: Insufficient documentation

## 2023-05-31 DIAGNOSIS — D509 Iron deficiency anemia, unspecified: Secondary | ICD-10-CM

## 2023-05-31 DIAGNOSIS — K7581 Nonalcoholic steatohepatitis (NASH): Secondary | ICD-10-CM

## 2023-05-31 DIAGNOSIS — D649 Anemia, unspecified: Secondary | ICD-10-CM | POA: Diagnosis not present

## 2023-05-31 NOTE — Patient Instructions (Addendum)
-   Schedule EGD and colonoscopy - Perform blood workup - Schedule liver US  - Reduce salt intake to <2 g per day - Can take Tylenol  max of 2 g per day (650 mg q8h) for pain - Avoid NSAIDs for pain - Avoid eating raw oysters/shellfish - Protein shake (Ensure or Boost) every night before going to sleep - Continue stool softeners daily - Obtain hepatitis A and B vaccination
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
## 2023-06-01 ENCOUNTER — Other Ambulatory Visit (INDEPENDENT_AMBULATORY_CARE_PROVIDER_SITE_OTHER): Payer: Self-pay | Admitting: Gastroenterology

## 2023-06-01 DIAGNOSIS — D509 Iron deficiency anemia, unspecified: Secondary | ICD-10-CM

## 2023-06-01 LAB — B12 AND FOLATE PANEL
Folate: 10.4 ng/mL
Vitamin B-12: 419 pg/mL (ref 200–1100)

## 2023-06-01 LAB — CBC WITH DIFFERENTIAL/PLATELET
Absolute Lymphocytes: 1256 {cells}/uL (ref 850–3900)
Absolute Monocytes: 299 {cells}/uL (ref 200–950)
Basophils Absolute: 9 {cells}/uL (ref 0–200)
Basophils Relative: 0.2 %
Eosinophils Absolute: 170 {cells}/uL (ref 15–500)
Eosinophils Relative: 3.7 %
HCT: 33 % — ABNORMAL LOW (ref 35.0–45.0)
Hemoglobin: 10.8 g/dL — ABNORMAL LOW (ref 11.7–15.5)
MCH: 28.4 pg (ref 27.0–33.0)
MCHC: 32.7 g/dL (ref 32.0–36.0)
MCV: 86.8 fL (ref 80.0–100.0)
MPV: 11.2 fL (ref 7.5–12.5)
Monocytes Relative: 6.5 %
Neutro Abs: 2866 {cells}/uL (ref 1500–7800)
Neutrophils Relative %: 62.3 %
Platelets: 126 10*3/uL — ABNORMAL LOW (ref 140–400)
RBC: 3.8 10*6/uL (ref 3.80–5.10)
RDW: 15.6 % — ABNORMAL HIGH (ref 11.0–15.0)
Total Lymphocyte: 27.3 %
WBC: 4.6 10*3/uL (ref 3.8–10.8)

## 2023-06-01 LAB — PROTIME-INR
INR: 0.9
Prothrombin Time: 10.3 s (ref 9.0–11.5)

## 2023-06-01 LAB — COMPREHENSIVE METABOLIC PANEL WITH GFR
AG Ratio: 2 (calc) (ref 1.0–2.5)
ALT: 20 U/L (ref 6–29)
AST: 23 U/L (ref 10–35)
Albumin: 3.9 g/dL (ref 3.6–5.1)
Alkaline phosphatase (APISO): 65 U/L (ref 37–153)
BUN: 14 mg/dL (ref 7–25)
CO2: 29 mmol/L (ref 20–32)
Calcium: 8.2 mg/dL — ABNORMAL LOW (ref 8.6–10.4)
Chloride: 104 mmol/L (ref 98–110)
Creat: 0.89 mg/dL (ref 0.50–1.05)
Globulin: 2 g/dL (ref 1.9–3.7)
Glucose, Bld: 184 mg/dL — ABNORMAL HIGH (ref 65–99)
Potassium: 3.8 mmol/L (ref 3.5–5.3)
Sodium: 142 mmol/L (ref 135–146)
Total Bilirubin: 0.4 mg/dL (ref 0.2–1.2)
Total Protein: 5.9 g/dL — ABNORMAL LOW (ref 6.1–8.1)
eGFR: 71 mL/min/{1.73_m2} (ref >=60)

## 2023-06-01 LAB — IRON,TIBC AND FERRITIN PANEL
%SAT: 18 % (ref 16–45)
Ferritin: 19 ng/mL (ref 16–288)
Iron: 68 ug/dL (ref 45–160)
TIBC: 382 ug/dL (ref 250–450)

## 2023-06-01 LAB — AFP TUMOR MARKER: AFP-Tumor Marker: 3.6 ng/mL

## 2023-06-02 ENCOUNTER — Telehealth (INDEPENDENT_AMBULATORY_CARE_PROVIDER_SITE_OTHER): Payer: Self-pay | Admitting: Gastroenterology

## 2023-06-02 ENCOUNTER — Encounter (INDEPENDENT_AMBULATORY_CARE_PROVIDER_SITE_OTHER): Payer: Self-pay

## 2023-06-02 MED ORDER — FERROUS SULFATE 325 (65 FE) MG PO TABS: 325.0000 mg | ORAL_TABLET | Freq: Every day | ORAL | 3 refills | Status: AC

## 2023-06-02 NOTE — Telephone Encounter (Signed)
 Ultrasound scheduled for 06/11/23 at 12:30pm;pt to arrive at 12:15pm. NPO 6-8 hours prior. Meds can be taken with a small sip of water if needed. Left message to return call. Also need to get prescriber name of Effient . Will also send my chart message

## 2023-06-03 ENCOUNTER — Telehealth (INDEPENDENT_AMBULATORY_CARE_PROVIDER_SITE_OTHER): Payer: Self-pay | Admitting: Gastroenterology

## 2023-06-03 NOTE — Telephone Encounter (Signed)
    06/03/23  [name] 1955-05-09  What type of surgery is being performed? EGD/Colonoscopy  When is surgery scheduled? TBD  What type of clearance is required (medical or pharmacy to hold medication or both? Pharmacy to hold medication  Are there any medications that need to be held prior to surgery and how long? Clearance to hold Effient  x7 days prior   Name of physician performing surgery?  Dr. Samantha Cress [facility] Gastroenterology at [facility] Phone: [phone] Fax: [phone]  Anethesia type (none, local, MAC, general)? Choice

## 2023-06-04 NOTE — Telephone Encounter (Signed)
   Name: [name]  DOB: 08-04-1955  MRN: [id number]  Primary Cardiologist: Mickiel Albany, MD (Inactive)  Chart reviewed as part of pre-operative protocol coverage. The patient has an upcoming visit scheduled with Dr. Abel Hoe on 06/21/2023 at which time clearance can be addressed in case there are any issues that would impact surgical recommendations.  Patient has not been seen in office since 04/08/2021 she will need an office visit for medical clearance as well as pharmacy clearance.  EGD/colonoscopy is not scheduled until TBD as below. I added preop FYI to appointment note so that provider is aware to address at time of outpatient visit.  Per office protocol the cardiology provider should forward their finalized clearance decision and recommendations regarding antiplatelet therapy to the requesting party below.    This message will also be routed to pharmacy pool and/or Dr Abel Hoe for input on holding Effient  as requested below so that this information is available to the clearing provider at time of patient's appointment.   I will route this message as FYI to requesting party and remove this message from the preop box as separate preop APP input not needed at this time.   Please call with any questions.  Ava Boatman, NP  06/04/2023, 10:32 AM

## 2023-06-05 ENCOUNTER — Other Ambulatory Visit: Payer: Self-pay | Admitting: Cardiovascular Disease

## 2023-06-07 ENCOUNTER — Encounter (INDEPENDENT_AMBULATORY_CARE_PROVIDER_SITE_OTHER): Payer: Self-pay

## 2023-06-09 NOTE — Telephone Encounter (Signed)
 Pt called into office and states that she could not get on her mychart to see her message. Gave pt lab results. Pt was aware of US  appt. Informed pt that we could not schedule procedures until after 5/19 when she sees cardiology. Pt verbalized understanding.

## 2023-06-11 ENCOUNTER — Ambulatory Visit (HOSPITAL_COMMUNITY)
Admission: RE | Admit: 2023-06-11 | Discharge: 2023-06-11 | Disposition: A | Discharge status: Home / Self Care | Source: Ambulatory Visit | Attending: Gastroenterology | Admitting: Gastroenterology

## 2023-06-11 DIAGNOSIS — K7581 Nonalcoholic steatohepatitis (NASH): Secondary | ICD-10-CM | POA: Diagnosis present

## 2023-06-11 DIAGNOSIS — K7460 Unspecified cirrhosis of liver: Secondary | ICD-10-CM | POA: Diagnosis present

## 2023-06-21 ENCOUNTER — Ambulatory Visit: Payer: 59 | Attending: Cardiovascular Disease | Admitting: Cardiovascular Disease

## 2023-06-21 ENCOUNTER — Encounter: Payer: Self-pay | Admitting: Cardiovascular Disease

## 2023-06-21 VITALS — BP 134/66 | HR 87 | Ht 64.0 in | Wt 214.6 lb

## 2023-06-21 DIAGNOSIS — I10 Essential (primary) hypertension: Secondary | ICD-10-CM | POA: Diagnosis not present

## 2023-06-21 DIAGNOSIS — E782 Mixed hyperlipidemia: Secondary | ICD-10-CM

## 2023-06-21 DIAGNOSIS — I25110 Atherosclerotic heart disease of native coronary artery with unstable angina pectoris: Secondary | ICD-10-CM
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
## 2023-06-21 NOTE — Patient Instructions (Addendum)
 Medication Instructions:  No changes *If you need a refill on your cardiac medications before your next appointment, please call your pharmacy*  Lab Work: none If you have labs (blood work) drawn today and your tests are completely normal, you will receive your results only by: MyChart Message (if you have MyChart) OR A paper copy in the mail If you have any lab test that is abnormal or we need to change your treatment, we will call you to review the results.  Testing/Procedures: Your physician has requested that you have a cardiac catheterization. Cardiac catheterization is used to diagnose and/or treat various heart conditions. Doctors may recommend this procedure for a number of different reasons. The most common reason is to evaluate chest pain. Chest pain can be a symptom of coronary artery disease (CAD), and cardiac catheterization can show whether plaque is narrowing or blocking your heart's arteries. This procedure is also used to evaluate the valves, as well as measure the blood flow and oxygen  levels in different parts of your heart. For further information please visit [email or web address] Please follow instruction sheet, as given.   Follow-Up: At [facility], you and your health needs are our priority.  As part of our continuing mission to provide you with exceptional heart care, our providers are all part of one team.  This team includes your primary Cardiologist (physician) and Advanced Practice Providers or APPs (Physician Assistants and Nurse Practitioners) who all work together to provide you with the care you need, when you need it.  Your next appointment:   3 week(s)  Provider:   Dr. Abel Hoe or an Advanced Practice Practitioner   Other Instructions       Cardiac/Peripheral Catheterization   You are scheduled for a Cardiac Catheterization on Wednesday, May 21 with Dr. Antoinette Batman.  1. Please arrive at the [facility] (Main Entrance A) at [facility]: [address] [place], [place] [address] at 6:30 AM (This time is TWO hour(s) before your procedure to ensure your preparation).   Free valet parking service is available. You will check in at ADMITTING. The support person will be asked to wait in the waiting room.  It is OK to have someone drop you off and come back when you are ready to be discharged.        Special note: Every effort is made to have your procedure done on time. Please understand that emergencies sometimes delay scheduled procedures.  2. Diet: Do not eat solid foods after midnight.  You may have clear liquids until 5 AM the day of the procedure.  3. Labs: completed 05/31/23  4. Medication instructions in preparation for your procedure:   Contrast Allergy: No -- iodine  listed on allergy list w note stating patient requests removal - has had scans with no issues since swelling, which was in the joint where the contrast was injected into her shoulder 30 years ago  No Lasix  (furosemide ) or losartan  day of procedure  Take only 7 units of insulin  the night before your procedure. Do not take any insulin  on the day of the procedure.   On the morning of your procedure, take Aspirin  81 mg and Effient /Prasugrel  and any morning medicines NOT listed above.  You may use sips of water.  5. Plan to go home the same day, you will only stay overnight if medically necessary. 6. You MUST have a responsible adult to drive you home. 7. An adult MUST be with you the first 24 hours  after you arrive home. 8. Bring a current list of your medications, and the last time and date medication taken. 9. Bring ID and current insurance cards. 10.Please wear clothes that are easy to get on and off and wear slip-on shoes.  Thank you for allowing us  to care for you!   -- [facility]

## 2023-06-22 ENCOUNTER — Telehealth: Payer: Self-pay | Admitting: *Deleted

## 2023-06-22 NOTE — Telephone Encounter (Addendum)
 Cardiac Catheterization scheduled at [facility] for: Wednesday Jun 23, 2023 8:30 AM Arrival time [name] Main Entrance A at: 6:30 AM  Nothing to eat after midnight prior to procedure, clear liquids until 5 AM day of procedure.  Contrast allergy-see allergy notes.  Patient tells me that she does not have allergy to IV contrast.   Medication instructions: -Hold:  Insulin -AM of procedure/1/2 usual Insulin  dose HS prior to procedure  Lasix /KCl-AM of procedure -Other usual morning medications can be taken with sips of water including aspirin  81 mg and Effient  10 mg.  Plan to go home the same day, you will only stay overnight if medically necessary.  You must have responsible adult to drive you home.  Someone must be with you the first 24 hours after you arrive home.  Reviewed procedure instructions with patient.

## 2023-06-23 ENCOUNTER — Other Ambulatory Visit: Payer: Self-pay

## 2023-06-23 ENCOUNTER — Ambulatory Visit (HOSPITAL_COMMUNITY)
Admission: RE | Disposition: A | Discharge status: Home / Self Care | Payer: Self-pay | Source: Home / Self Care | Attending: Cardiovascular Disease

## 2023-06-23 ENCOUNTER — Telehealth (INDEPENDENT_AMBULATORY_CARE_PROVIDER_SITE_OTHER): Payer: Self-pay

## 2023-06-23 ENCOUNTER — Ambulatory Visit (HOSPITAL_COMMUNITY)
Admission: RE | Admit: 2023-06-23 | Discharge: 2023-06-23 | Disposition: A | Discharge status: Home / Self Care | Attending: Cardiovascular Disease | Admitting: Cardiovascular Disease

## 2023-06-23 ENCOUNTER — Ambulatory Visit (INDEPENDENT_AMBULATORY_CARE_PROVIDER_SITE_OTHER): Payer: Self-pay | Admitting: Gastroenterology

## 2023-06-23 DIAGNOSIS — Z955 Presence of coronary angioplasty implant and graft: Secondary | ICD-10-CM | POA: Diagnosis not present

## 2023-06-23 DIAGNOSIS — I25118 Atherosclerotic heart disease of native coronary artery with other forms of angina pectoris: Secondary | ICD-10-CM | POA: Insufficient documentation

## 2023-06-23 DIAGNOSIS — I25110 Atherosclerotic heart disease of native coronary artery with unstable angina pectoris: Secondary | ICD-10-CM

## 2023-06-23 DIAGNOSIS — Z7902 Long term (current) use of antithrombotics/antiplatelets: Secondary | ICD-10-CM | POA: Diagnosis not present

## 2023-06-23 DIAGNOSIS — I10 Essential (primary) hypertension: Secondary | ICD-10-CM | POA: Insufficient documentation

## 2023-06-23 DIAGNOSIS — E7800 Pure hypercholesterolemia, unspecified: Secondary | ICD-10-CM | POA: Insufficient documentation

## 2023-06-23 HISTORY — PX: LEFT HEART CATH AND CORONARY ANGIOGRAPHY: CATH118249

## 2023-06-23 LAB — GLUCOSE, CAPILLARY: Glucose-Capillary: 220 mg/dL — ABNORMAL HIGH (ref 70–99)

## 2023-06-23 SURGERY — LEFT HEART CATH AND CORONARY ANGIOGRAPHY
Anesthesia: LOCAL

## 2023-06-23 MED ORDER — SODIUM CHLORIDE 0.9 % IV SOLN: INTRAVENOUS | Status: DC

## 2023-06-23 MED ORDER — VERAPAMIL HCL 2.5 MG/ML IV SOLN
INTRAVENOUS | Status: AC
Start: 2023-06-23 — End: ?
  Filled 2023-06-23: qty 2

## 2023-06-23 MED ORDER — HEPARIN SODIUM (PORCINE) 1000 UNIT/ML IJ SOLN
INTRAMUSCULAR | Status: AC
  Filled 2023-06-23: qty 10

## 2023-06-23 MED ORDER — MIDAZOLAM HCL 2 MG/2ML IJ SOLN
INTRAMUSCULAR | Status: AC
  Filled 2023-06-23: qty 2

## 2023-06-23 MED ORDER — IOHEXOL 350 MG/ML SOLN
INTRAVENOUS | Status: DC | PRN
  Administered 2023-06-23: 35 mL

## 2023-06-23 MED ORDER — HYDRALAZINE HCL 20 MG/ML IJ SOLN: 10.0000 mg | INTRAMUSCULAR | Status: DC | PRN

## 2023-06-23 MED ORDER — SODIUM CHLORIDE 0.9 % IV SOLN
INTRAVENOUS | Status: DC | PRN
  Administered 2023-06-23: 20 mL/h via INTRAVENOUS

## 2023-06-23 MED ORDER — SODIUM CHLORIDE 0.9 % IV SOLN: 250.0000 mL | INTRAVENOUS | Status: DC | PRN

## 2023-06-23 MED ORDER — LABETALOL HCL 5 MG/ML IV SOLN: 10.0000 mg | INTRAVENOUS | Status: DC | PRN

## 2023-06-23 MED ORDER — ONDANSETRON HCL 4 MG/2ML IJ SOLN: 4.0000 mg | Freq: Four times a day (QID) | INTRAMUSCULAR | Status: DC | PRN

## 2023-06-23 MED ORDER — SODIUM CHLORIDE 0.9% FLUSH: 3.0000 mL | Freq: Two times a day (BID) | INTRAVENOUS | Status: DC

## 2023-06-23 MED ORDER — SODIUM CHLORIDE 0.9 % WEIGHT BASED INFUSION: 3.0000 mL/kg/h | INTRAVENOUS | Status: AC

## 2023-06-23 MED ORDER — SODIUM CHLORIDE 0.9% FLUSH: 3.0000 mL | INTRAVENOUS | Status: DC | PRN

## 2023-06-23 MED ORDER — ASPIRIN 81 MG PO CHEW: 81.0000 mg | CHEWABLE_TABLET | ORAL | Status: DC

## 2023-06-23 MED ORDER — LIDOCAINE HCL (PF) 1 % IJ SOLN
INTRAMUSCULAR | Status: DC | PRN
  Administered 2023-06-23: 2 mL

## 2023-06-23 MED ORDER — MIDAZOLAM HCL 2 MG/2ML IJ SOLN
INTRAMUSCULAR | Status: DC | PRN
  Administered 2023-06-23 (×2): 1 mg via INTRAVENOUS

## 2023-06-23 MED ORDER — ACETAMINOPHEN 325 MG PO TABS: 650.0000 mg | ORAL_TABLET | ORAL | Status: DC | PRN

## 2023-06-23 MED ORDER — FENTANYL CITRATE (PF) 100 MCG/2ML IJ SOLN
INTRAMUSCULAR | Status: AC
  Filled 2023-06-23: qty 2

## 2023-06-23 MED ORDER — SODIUM CHLORIDE 0.9 % WEIGHT BASED INFUSION: 1.0000 mL/kg/h | INTRAVENOUS | Status: DC

## 2023-06-23 MED ORDER — HEPARIN (PORCINE) IN NACL 1000-0.9 UT/500ML-% IV SOLN
INTRAVENOUS | Status: DC | PRN
  Administered 2023-06-23 (×2): 500 mL

## 2023-06-23 MED ORDER — FENTANYL CITRATE (PF) 100 MCG/2ML IJ SOLN
INTRAMUSCULAR | Status: DC | PRN
  Administered 2023-06-23 (×2): 25 ug via INTRAVENOUS

## 2023-06-23 SURGICAL SUPPLY — 11 items
CATH INFINITI 5FR MULTPACK ANG (CATHETERS) IMPLANT
CLOSURE MYNX CONTROL 5F (Vascular Products) IMPLANT
ELECT DEFIB PAD ADLT CADENCE (PAD) IMPLANT
KIT ENCORE 26 ADVANTAGE (KITS) IMPLANT
KIT MICROPUNCTURE NIT STIFF (SHEATH) IMPLANT
KIT SYRINGE INJ CVI SPIKEX1 (MISCELLANEOUS) IMPLANT
PACK CARDIAC CATHETERIZATION (CUSTOM PROCEDURE TRAY) ×1 IMPLANT
SET ATX-X65L (MISCELLANEOUS) IMPLANT
SHEATH PINNACLE 5F 10CM (SHEATH) IMPLANT
SHEATH PROBE COVER 6X72 (BAG) IMPLANT
WIRE EMERALD 3MM-J .035X150CM (WIRE) IMPLANT

## 2023-06-23 NOTE — Discharge Instructions (Signed)
 Please take Lasix  40 mg po BID for 4 days (Increased dosage due to slightly elevated pressures in the heart which may be from slight volume overload)

## 2023-06-23 NOTE — Interval H&P Note (Signed)
 History and Physical Interval Note:  06/23/2023 8:33 AM  Lynann Sandman  has presented today for surgery, with the diagnosis of unstable angina.  The various methods of treatment have been discussed with the patient and family. After consideration of risks, benefits and other options for treatment, the patient has consented to  Procedure(s): LEFT HEART CATH AND CORONARY ANGIOGRAPHY (N/A) as a surgical intervention.  The patient's history has been reviewed, patient examined, no change in status, stable for surgery.  I have reviewed the patient's chart and labs.  Questions were answered to the patient's satisfaction.    Cath Lab Visit (complete for each Cath Lab visit)  Clinical Evaluation Leading to the Procedure:   ACS: No.  Non-ACS:    Anginal Classification: CCS III  Anti-ischemic medical therapy: Maximal Therapy (2 or more classes of medications)  Non-Invasive Test Results: No non-invasive testing performed  Prior CABG: No previous CABG    Antoinette Batman

## 2023-06-23 NOTE — Telephone Encounter (Signed)
 I called and left a message on patient vm, asked that the patient please return call to the office.

## 2023-06-23 NOTE — Telephone Encounter (Signed)
 Patient calling for her US  results from 06/11/2023. Please advise.

## 2023-06-24 ENCOUNTER — Encounter (HOSPITAL_COMMUNITY): Payer: Self-pay | Admitting: Cardiovascular Disease

## 2023-06-24 NOTE — Telephone Encounter (Signed)
 I called and left a message on vm, asked that the patient please return call or respond through My Chart.

## 2023-06-25 MED FILL — Verapamil HCl IV Soln 2.5 MG/ML: INTRAVENOUS | Qty: 2 | Status: AC

## 2023-06-29 ENCOUNTER — Other Ambulatory Visit: Payer: Self-pay | Admitting: Cardiovascular Disease

## 2023-06-29 NOTE — Telephone Encounter (Signed)
 Patient read the My Chart message 06/29/2023 at 7:18 am. (See below).  Hello, I tried calling you this morning to let you know per Gayle Kava, NP,     Please let patient know that US  looks stable, no obvious masses or lesions, will repeat again in 6 months.  Last read by Lynann Sandman at 7:18AM on 06/29/2023.

## 2023-07-03 ENCOUNTER — Other Ambulatory Visit: Payer: Self-pay | Admitting: Cardiovascular Disease

## 2023-07-05 ENCOUNTER — Telehealth: Payer: Self-pay | Admitting: Cardiovascular Disease

## 2023-07-05 NOTE — Telephone Encounter (Signed)
 Patient contacted 3x with no success, will send reminder letter to contact office

## 2023-07-05 NOTE — Telephone Encounter (Signed)
-----   Message from Nurse Manley Seeds sent at 06/21/2023  4:53 PM EDT ----- Regarding: LEFT HEART CATH - 06/23/23 ---  FOLLOW UP APPOINTMENT IN CLINIC Hey, Patient scheduled for left heart cath this Wed 06/23/23 with Dr. Abel Hoe 8:30 am. Dx: unstable angina.   Anne-See note in instructions re: listed iodine  allergy  Scheduling, can you please work her back in for follow up in about 3 weeks?  Thank you! Michalene

## 2023-07-06 NOTE — Telephone Encounter (Signed)
   Name: DEYSY SCHABEL  DOB: 01/29/1956  MRN: [id number]  Primary Cardiologist: Antoinette Batman, MD  Chart reviewed as part of pre-operative protocol coverage. Because of Rogina Schiano England's past medical history and time since last visit, she will require a follow-up in-office visit in order to better assess preoperative cardiovascular risk.  On chart review Dr. Abel Hoe has requested that patient have a 3-week post cath follow-up.  Preoperative cardiac evaluation can be addressed at that time.  Pre-op covering staff: - Please schedule appointment and call patient to inform them. If patient already had an upcoming appointment within acceptable timeframe, please add "pre-op clearance" to the appointment notes so provider is aware. - Please contact requesting surgeon's office via preferred method (i.e, phone, fax) to inform them of need for appointment prior to surgery.  Per Dr. Abel Hoe if patient has remained stable on follow-up visit she can hold Effient  for 7 days prior to procedure.  Haydn Cush D Tiffanye Hartmann, NP  07/06/2023, 11:51 AM

## 2023-07-06 NOTE — Telephone Encounter (Signed)
 Left message for the pt to call back to schedule a f/u appt s/p heart cath as preop clearance is also needed.

## 2023-07-08 NOTE — Telephone Encounter (Signed)
 2nd attempt to reach the pt to schedule an appt in the office for post cath procedure as well as she need preop clearance also.

## 2023-07-09 NOTE — Telephone Encounter (Signed)
 3rd attempt to reach the pt; see previous notes. Pt recently had heart cath 06/23/23 and needs f/u post cath as well as for preop clearance.   I will update the requesting office pt needs to call [phone] and schedule appt in office with Dr. Abel Hoe or his team for appt.   Will removed from the preop call back .

## 2023-07-10 NOTE — Telephone Encounter (Signed)
 Thanks Sheryle Donning.  Hi Mindy/Tammy, please see thread. If she wants to schedule a procedure, she will need to call the cardiology office and make and appointment to get clearance. Once she is cleared, she will need to call us  to schedule her procedure.  Thanks,  Samantha Cress, MD Gastroenterology and Hepatology [facility] Gastroenterology

## 2023-07-12 NOTE — Telephone Encounter (Signed)
 Called pt, LMOVM advising one we receive clearance we will schedule her

## 2023-07-12 NOTE — Telephone Encounter (Signed)
 Thanks

## 2023-07-12 NOTE — Telephone Encounter (Signed)
 Looks like scheduled a follow up 6/17 for heart cath follow up.

## 2023-07-12 NOTE — Telephone Encounter (Signed)
 Thanks, after she is seen by their office and obtained clearance, we can proceed with scheduling. Please notify her about this.

## 2023-07-12 NOTE — Telephone Encounter (Signed)
 Pt ha appt 07/20/23 with Palmer Bobo, NP for post cath f/u and preop clearance. I will update the requesting office of the appt 07/20/23.

## 2023-07-20 ENCOUNTER — Encounter: Payer: Self-pay | Admitting: Emergency Medicine

## 2023-07-20 ENCOUNTER — Ambulatory Visit: Attending: Emergency Medicine | Admitting: Emergency Medicine

## 2023-07-20 VITALS — BP 122/60 | HR 83 | Ht 63.0 in | Wt 214.0 lb

## 2023-07-20 DIAGNOSIS — I2510 Atherosclerotic heart disease of native coronary artery without angina pectoris: Secondary | ICD-10-CM | POA: Diagnosis not present

## 2023-07-20 DIAGNOSIS — E782 Mixed hyperlipidemia: Secondary | ICD-10-CM | POA: Diagnosis not present

## 2023-07-20 DIAGNOSIS — I200 Unstable angina: Secondary | ICD-10-CM

## 2023-07-20 DIAGNOSIS — I10 Essential (primary) hypertension: Secondary | ICD-10-CM | POA: Diagnosis not present

## 2023-07-20 DIAGNOSIS — Z01810 Encounter for preprocedural cardiovascular examination: Secondary | ICD-10-CM

## 2023-07-20 DIAGNOSIS — I25110 Atherosclerotic heart disease of native coronary artery with unstable angina pectoris: Secondary | ICD-10-CM

## 2023-07-20 NOTE — Patient Instructions (Signed)
 Medication Instructions:  NO CHANGES   Lab Work: BMET TO BE DONE TODAY.   Testing/Procedures: NONE  Follow-Up: At [facility], you and your health needs are our priority.  As part of our continuing mission to provide you with exceptional heart care, our providers are all part of one team.  This team includes your primary Cardiologist (physician) and Advanced Practice Providers or APPs (Physician Assistants and Nurse Practitioners) who all work together to provide you with the care you need, when you need it.  Your next appointment:   6 MONTHS  Provider:   Antoinette Batman, MD OR Palmer Bobo, [place]

## 2023-07-20 NOTE — Progress Notes (Signed)
 Cardiology Office Note:    Date:  07/20/2023  ID:  [name], DOB 1956/01/20, MRN [id number] PCP: Forest Idol, NP  [facility] Providers Cardiologist:  Antoinette Batman, MD       Patient Profile:       Chief Complaint: Follow-up cardiac catheterization History of Present Illness:  [name] is a 68 y.o. female with visit-pertinent history of GERD, colon cancer, NASH cirrhosis, coronary artery disease, type 2 diabetes, hypertension, hyperlipidemia, hypothyroidism, mitral regurgitation and sleep apnea  She had a PCI of her LAD in 2005 with her placement of overlapping Cypher drug-eluting stents in proximal LAD.  She had restenosis of the stents treated with PTCA/scoring balloon angioplasty in May 2019 and repeat PCI in August 2019 with placement drug-eluting stent in the ostial/proximal LAD.  Repeat catheterization October 2019 with patent LAD stents.  She had recurrent angina in November 2022 and had severe stenosis in mid LAD treated with a drug-eluting stent.  She is a Plavix  nonresponder.  She did not tolerate Brilinta .  She has been on Effient .  Echocardiogram in 2019 showed LVEF 60 to 65%, mild MR.  Patient was last seen in office on 06/21/2023.  She is having exertional chest pain that is similar to her prior angina.  She underwent cardiac catheterization on 06/23/2023 showing patent proximal-mid LAD stents without restenosis, no obstructive disease in circumflex or intermediate branch, the RCA has moderate stenosis in the mid vessel is unchanged from last cath and does not appear to be flow-limiting, normal LV systolic function.   Discussed the use of AI scribe software for clinical note transcription with the patient, who gave verbal consent to proceed.  History of Present Illness Today patient notes she is doing well overall.  No new concerns or complaints.  She experiences chest pain and heaviness, described as a warm sensation rising through her throat,  particularly at night when lying down. These symptoms are associated with acid reflux, which began after her gallbladder removal years ago. She previously managed this with pantoprazole  but has been without it for about a year.  She denies any exertional chest pains, dyspnea, orthopnea, PND, leg swelling, palpitations, syncope, presyncope.  She has diabetes and was previously on Mounjaro, which helped her lose 25 pounds but was discontinued due to unexplained bleeding and subsequent weight gain of 15 pounds. She is currently on insulin .  She reports current anemia with a hemoglobin level of 10, feeling weak. She had one episode of bloody stool and is awaiting a colonoscopy and endoscopy to investigate the source of bleeding.  She does have prior history of colon cancer.  Review of systems:  Please see the history of present illness. All other systems are reviewed and otherwise negative.      Studies Reviewed:    EKG Interpretation Date/Time:  Tuesday July 20 2023 14:09:13 EDT Ventricular Rate:  83 PR Interval:  150 QRS Duration:  144 QT Interval:  420 QTC Calculation: 493 R Axis:   -71  Text Interpretation: Normal sinus rhythm Right bundle branch block Left anterior fascicular block Bifascicular block Minimal voltage criteria for LVH, may be normal variant ( R in aVL ) Septal infarct , age undetermined Confirmed by Palmer Bobo [phone]) on 07/20/2023 2:22:58 PM    Cardiac catheterization 06/23/2023 Patent proximal and mid LAD stents without restenosis No obstructive disease in the Circumflex or intermediate branch The RCA has moderate stenosis in the mid vessel that is unchanged from last cath and does not  appear to be flow limiting.  Normal LV systolic function Diagnostic Dominance: Right  Risk Assessment/Calculations:              Physical Exam:   VS:  BP 122/60 (BP Location: Right Arm, Patient Position: Sitting, Cuff Size: Small)   Pulse 83   Ht 5' 3 (1.6 m)   Wt 214 lb  (97.1 kg)   BMI 37.91 kg/m    Wt Readings from Last 3 Encounters:  07/20/23 214 lb (97.1 kg)  06/23/23 214 lb (97.1 kg)  06/21/23 214 lb 9.6 oz (97.3 kg)    GEN: Well nourished, well developed in no acute distress NECK: No JVD; No carotid bruits CARDIAC: RRR, no murmurs, rubs, gallops RESPIRATORY:  Clear to auscultation without rales, wheezing or rhonchi  ABDOMEN: Soft, non-tender, non-distended EXTREMITIES:  No edema; No acute deformity      Assessment and Plan:  Coronary artery disease LHC 06/2023 with patent proximal and mid LAD stents without restenosis and no obstructive disease in circumflex or intermediate branch.  RCA with moderate 50% stenosis in the mid vessel is unchanged from last cath is not on flow-limiting - She describes chest pains as a warm sensation rising up through her throat particularly at night when lying down which started after her gallbladder removal years ago.  Her description is likely GERD.  Will refill her pantoprazole  today and she will follow-up with GI. - Today she denies any anginal symptoms, no indication for further ischemic evaluation at this time - Continue Effient  10 mg daily, aspirin  81 mg daily, rosuvastatin  40 mg daily, metoprolol  XL 100 mg daily, losartan  25 mg daily, fenofibrate 160 mg daily, nitroglycerin  as needed  Hyperlipidemia, LDL goal <70 LDL 83 on 11/2022 LDL not well-controlled She will work on heart healthy dieting and physical exercise and will repeat fasting lipid panel at follow-up visit.  Can consider Zetia if LDL not at goal - Continue rosuvastatin  40 mg daily - Recommend DASH diet (high in vegetables, fruits, low-fat dairy products, whole grains, poultry, fish, and nuts and low in sweets, sugar-sweetened beverages, and red meats), salt restriction and increase physical activity.   Hypertension Blood pressure is well-controlled at 122/60 - Continue amlodipine  5 mg daily, Lasix  60 mg daily, losartan  25 mg daily, metoprolol  XL  100 mg daily  History of CVA Diagnosed with CVA on 12/12/2020 at [facility] - Remains asymptomatic - No changes to current therapy  History of colon cancer S/p laparoscopic hemicolectomy in 2021 Pending EGD/colonoscopy  Preoperative cardiovascular exam EGD/colonoscopy on date TBD with [facility] gastroenterology at [facility] by Dr. Sammi Crick  According to the Revised Cardiac Risk Index (RCRI), her Perioperative Risk of Major Cardiac Event is (%): 6.6. Her Functional Capacity in METs is: 5.62 according to the Duke Activity Status Index (DASI). Therefore, based on ACC/AHA guidelines, patient would be at acceptable risk for the planned procedure without further cardiovascular testing. I will route this recommendation to the requesting party via Epic fax function.  She may hold Effient  for 7 days prior to procedure. Please resume Effient  as soon as possible postprocedure, at the discretion of the surgeon.        Dispo:  Return in about 6 months (around 01/19/2024).  Signed, Ava Boatman, NP

## 2023-07-21 ENCOUNTER — Encounter: Payer: Self-pay | Admitting: Emergency Medicine

## 2023-07-21 ENCOUNTER — Ambulatory Visit: Payer: Self-pay | Admitting: Emergency Medicine

## 2023-07-21 LAB — BASIC METABOLIC PANEL WITH GFR
BUN/Creatinine Ratio: 22 (ref 12–28)
BUN: 29 mg/dL — ABNORMAL HIGH (ref 8–27)
CO2: 22 mmol/L (ref 20–29)
Calcium: 10.1 mg/dL (ref 8.7–10.3)
Chloride: 96 mmol/L (ref 96–106)
Creatinine, Ser: 1.32 mg/dL — ABNORMAL HIGH (ref 0.57–1.00)
Glucose: 322 mg/dL — ABNORMAL HIGH (ref 70–99)
Potassium: 4.7 mmol/L (ref 3.5–5.2)
Sodium: 140 mmol/L (ref 134–144)
eGFR: 44 mL/min/{1.73_m2} — ABNORMAL LOW (ref >59)

## 2023-07-26 NOTE — Telephone Encounter (Signed)
Please advise regarding clearance  

## 2023-07-26 NOTE — Telephone Encounter (Signed)
 Please schedule the patient, okay to hold Effient  for 7 days.

## 2023-07-26 NOTE — Telephone Encounter (Signed)
 [facility], NP's note to [facility] for clearance.

## 2023-07-27 NOTE — Telephone Encounter (Signed)
 LMOVM to call back to schedule TCS/EGD, room 3

## 2023-07-28 NOTE — Telephone Encounter (Signed)
 Lmovm to call back

## 2023-08-03 ENCOUNTER — Encounter (INDEPENDENT_AMBULATORY_CARE_PROVIDER_SITE_OTHER): Payer: Self-pay | Admitting: *Deleted

## 2023-08-03 ENCOUNTER — Other Ambulatory Visit (INDEPENDENT_AMBULATORY_CARE_PROVIDER_SITE_OTHER): Payer: Self-pay | Admitting: *Deleted

## 2023-08-03 MED ORDER — PEG 3350-KCL-NA BICARB-NACL 420 G PO SOLR: 4000.0000 mL | Freq: Once | ORAL | 0 refills | Status: AC

## 2023-08-03 NOTE — Telephone Encounter (Signed)
 LMOVM to return call.

## 2023-08-03 NOTE — Telephone Encounter (Signed)
 Pt has been scheduled for 08/24/23. Instructions mailed and prep sent to pharmacy.

## 2023-08-04 ENCOUNTER — Encounter: Payer: Self-pay | Admitting: *Deleted

## 2023-08-19 NOTE — Patient Instructions (Signed)
 [name]  08/19/2023     @PREFPERIOPPHARMACY @   Your procedure is scheduled on 08/24/2023.   Report to [facility] at   0805 A.M.   Call this number if you have problems the morning of surgery:  [phone]  If you experience any cold or flu symptoms such as cough, fever, chills, shortness of breath, etc. between now and your scheduled surgery, please notify us  at the above number.   Remember:        Your last dose of effient  should be on 08/16/2023.         Take 1/2 of your usual insulin  dosage the night before your procedure.         DO NOT take any medications for diabetes the morning of your procedure.          Use your inhaler before you come and bring your rescue inhaler with you.   Follow the diet and prep instructions given to you by the office.    You may drink clear liquids until  0600 am on 08/24/2023.    Clear liquids allowed are:                    Water, Juice (No red color; non-citric and without pulp; diabetics please choose diet or no sugar options), Carbonated beverages (diabetics please choose diet or no sugar options), Clear Tea (No creamer, milk, or cream, including half & half and powdered creamer), Black Coffee Only (No creamer, milk or cream, including half & half and powdered creamer), and Clear Sports drink (No red color; diabetics please choose diet or no sugar options)    Take these medicines the morning of surgery with A SIP OF WATER        amlodipine , diazepam , gabapentin , hydroxyzine , levothyroxine , metoprolol , oxycodone (if needed), pantoprazole , sertraline .    Do not wear jewelry, make-up or nail polish, including gel polish,  artificial nails, or any other type of covering on natural nails (fingers and  toes).  Do not wear lotions, powders, or perfumes, or deodorant.  Do not shave 48 hours prior to surgery.  Men may shave face and neck.  Do not bring valuables to the hospital.  [facility] is not responsible for any belongings or  valuables.  Contacts, dentures or bridgework may not be worn into surgery.  Leave your suitcase in the car.  After surgery it may be brought to your room.  For patients admitted to the hospital, discharge time will be determined by your treatment team.  Patients discharged the day of surgery will not be allowed to drive home and must have someone with them for 24 hours.    Special instructions:   DO NOT smoke tobacco or vape for 24 hours before your procedure.  Please read over the following fact sheets that you were given. Anesthesia Post-op Instructions and Care and Recovery After Surgery          Upper Endoscopy, Adult, Care After After the procedure, it is common to have a sore throat. It is also common to have: Mild stomach pain or discomfort. Bloating. Nausea. Follow these instructions at home: The instructions below may help you care for yourself at home. Your health care provider may give you more instructions. If you have questions, ask your health care provider. If you were given a sedative during the procedure, it can affect you for several hours. Do not drive or operate machinery until your health care provider says that  it is safe. If you will be going home right after the procedure, plan to have a responsible adult: Take you home from the hospital or clinic. You will not be allowed to drive. Care for you for the time you are told. Follow instructions from your health care provider about what you may eat and drink. Return to your normal activities as told by your health care provider. Ask your health care provider what activities are safe for you. Take over-the-counter and prescription medicines only as told by your health care provider. Contact a health care provider if you: Have a sore throat that lasts longer than one day. Have trouble swallowing. Have a fever. Get help right away if you: Vomit blood or your vomit looks like coffee grounds. Have bloody, black,  or tarry stools. Have a very bad sore throat or you cannot swallow. Have difficulty breathing or very bad pain in your chest or abdomen. These symptoms may be an emergency. Get help right away. Call 911. Do not wait to see if the symptoms will go away. Do not drive yourself to the hospital. Summary After the procedure, it is common to have a sore throat, mild stomach discomfort, bloating, and nausea. If you were given a sedative during the procedure, it can affect you for several hours. Do not drive until your health care provider says that it is safe. Follow instructions from your health care provider about what you may eat and drink. Return to your normal activities as told by your health care provider. This information is not intended to replace advice given to you by your health care provider. Make sure you discuss any questions you have with your health care provider. Document Revised: 04/30/2021 Document Reviewed: 04/30/2021 Elsevier Patient Education  2024 [employer].Colonoscopy, Adult, Care After The following information offers guidance on how to care for yourself after your procedure. Your health care provider may also give you more specific instructions. If you have problems or questions, contact your health care provider. What can I expect after the procedure? After the procedure, it is common to have: A small amount of blood in your stool for 24 hours after the procedure. Some gas. Mild cramping or bloating of your abdomen. Follow these instructions at home: Eating and drinking  Drink enough fluid to keep your urine pale yellow. Follow instructions from your health care provider about eating or drinking restrictions. Resume your normal diet as told by your health care provider. Avoid heavy or fried foods that are hard to digest. Activity Rest as told by your health care provider. Avoid sitting for a long time without moving. Get up to take short walks every 1-2 hours. This  is important to improve blood flow and breathing. Ask for help if you feel weak or unsteady. Return to your normal activities as told by your health care provider. Ask your health care provider what activities are safe for you. Managing cramping and bloating  Try walking around when you have cramps or feel bloated. If directed, apply heat to your abdomen as told by your health care provider. Use the heat source that your health care provider recommends, such as a moist heat pack or a heating pad. Place a towel between your skin and the heat source. Leave the heat on for 20-30 minutes. Remove the heat if your skin turns bright red. This is especially important if you are unable to feel pain, heat, or cold. You have a greater risk of getting burned. General instructions  If you were given a sedative during the procedure, it can affect you for several hours. Do not drive or operate machinery until your health care provider says that it is safe. For the first 24 hours after the procedure: Do not sign important documents. Do not drink alcohol. Do your regular daily activities at a slower pace than normal. Eat soft foods that are easy to digest. Take over-the-counter and prescription medicines only as told by your health care provider. Keep all follow-up visits. This is important. Contact a health care provider if: You have blood in your stool 2-3 days after the procedure. Get help right away if: You have more than a small spotting of blood in your stool. You have large blood clots in your stool. You have swelling of your abdomen. You have nausea or vomiting. You have a fever. You have increasing pain in your abdomen that is not relieved with medicine. These symptoms may be an emergency. Get help right away. Call 911. Do not wait to see if the symptoms will go away. Do not drive yourself to the hospital. Summary After the procedure, it is common to have a small amount of blood in your stool.  You may also have mild cramping and bloating of your abdomen. If you were given a sedative during the procedure, it can affect you for several hours. Do not drive or operate machinery until your health care provider says that it is safe. Get help right away if you have a lot of blood in your stool, nausea or vomiting, a fever, or increased pain in your abdomen. This information is not intended to replace advice given to you by your health care provider. Make sure you discuss any questions you have with your health care provider. Document Revised: 03/03/2022 Document Reviewed: 09/11/2020 Elsevier Patient Education  2024 Elsevier Inc.General Anesthesia, Adult, Care After The following information offers guidance on how to care for yourself after your procedure. Your health care provider may also give you more specific instructions. If you have problems or questions, contact your health care provider. What can I expect after the procedure? After the procedure, it is common for people to: Have pain or discomfort at the IV site. Have nausea or vomiting. Have a sore throat or hoarseness. Have trouble concentrating. Feel cold or chills. Feel weak, sleepy, or tired (fatigue). Have soreness and body aches. These can affect parts of the body that were not involved in surgery. Follow these instructions at home: For the time period you were told by your health care provider:  Rest. Do not participate in activities where you could fall or become injured. Do not drive or use machinery. Do not drink alcohol. Do not take sleeping pills or medicines that cause drowsiness. Do not make important decisions or sign legal documents. Do not take care of children on your own. General instructions Drink enough fluid to keep your urine pale yellow. If you have sleep apnea, surgery and certain medicines can increase your risk for breathing problems. Follow instructions from your health care provider about wearing  your sleep device: Anytime you are sleeping, including during daytime naps. While taking prescription pain medicines, sleeping medicines, or medicines that make you drowsy. Return to your normal activities as told by your health care provider. Ask your health care provider what activities are safe for you. Take over-the-counter and prescription medicines only as told by your health care provider. Do not use any products that contain nicotine or tobacco. These products include  cigarettes, chewing tobacco, and vaping devices, such as e-cigarettes. These can delay incision healing after surgery. If you need help quitting, ask your health care provider. Contact a health care provider if: You have nausea or vomiting that does not get better with medicine. You vomit every time you eat or drink. You have pain that does not get better with medicine. You cannot urinate or have bloody urine. You develop a skin rash. You have a fever. Get help right away if: You have trouble breathing. You have chest pain. You vomit blood. These symptoms may be an emergency. Get help right away. Call 911. Do not wait to see if the symptoms will go away. Do not drive yourself to the hospital. Summary After the procedure, it is common to have a sore throat, hoarseness, nausea, vomiting, or to feel weak, sleepy, or fatigue. For the time period you were told by your health care provider, do not drive or use machinery. Get help right away if you have difficulty breathing, have chest pain, or vomit blood. These symptoms may be an emergency. This information is not intended to replace advice given to you by your health care provider. Make sure you discuss any questions you have with your health care provider. Document Revised: 04/18/2021 Document Reviewed: 04/18/2021 Elsevier Patient Education  2024 [employer].

## 2023-08-20 ENCOUNTER — Encounter (HOSPITAL_COMMUNITY): Payer: Self-pay

## 2023-08-20 ENCOUNTER — Other Ambulatory Visit: Payer: Self-pay

## 2023-08-20 ENCOUNTER — Ambulatory Visit (INDEPENDENT_AMBULATORY_CARE_PROVIDER_SITE_OTHER): Payer: Self-pay | Admitting: Gastroenterology

## 2023-08-20 ENCOUNTER — Encounter (HOSPITAL_COMMUNITY)
Admission: RE | Admit: 2023-08-20 | Discharge: 2023-08-20 | Disposition: A | Discharge status: Home / Self Care | Source: Ambulatory Visit | Attending: Gastroenterology | Admitting: Gastroenterology

## 2023-08-20 VITALS — BP 144/65 | HR 86 | Resp 18 | Ht 63.0 in | Wt 214.1 lb

## 2023-08-20 DIAGNOSIS — Z01812 Encounter for preprocedural laboratory examination: Secondary | ICD-10-CM | POA: Insufficient documentation

## 2023-08-20 DIAGNOSIS — K7460 Unspecified cirrhosis of liver: Secondary | ICD-10-CM | POA: Insufficient documentation

## 2023-08-20 DIAGNOSIS — K7581 Nonalcoholic steatohepatitis (NASH): Secondary | ICD-10-CM | POA: Diagnosis not present

## 2023-08-20 HISTORY — DX: Cerebral infarction, unspecified: I63.9

## 2023-08-20 LAB — PROTIME-INR
INR: 1 (ref 0.8–1.2)
Prothrombin Time: 13.5 s (ref 11.4–15.2)

## 2023-08-20 LAB — CBC WITH DIFFERENTIAL/PLATELET
Abs Immature Granulocytes: 0.01 K/uL (ref 0.00–0.07)
Basophils Absolute: 0 K/uL (ref 0.0–0.1)
Basophils Relative: 0 %
Eosinophils Absolute: 0.2 K/uL (ref 0.0–0.5)
Eosinophils Relative: 3 %
HCT: 40 % (ref 36.0–46.0)
Hemoglobin: 13.9 g/dL (ref 12.0–15.0)
Immature Granulocytes: 0 %
Lymphocytes Relative: 25 %
Lymphs Abs: 1.2 K/uL (ref 0.7–4.0)
MCH: 30.8 pg (ref 26.0–34.0)
MCHC: 34.8 g/dL (ref 30.0–36.0)
MCV: 88.7 fL (ref 80.0–100.0)
Monocytes Absolute: 0.4 K/uL (ref 0.1–1.0)
Monocytes Relative: 8 %
Neutro Abs: 3.2 K/uL (ref 1.7–7.7)
Neutrophils Relative %: 64 %
Platelets: 124 K/uL — ABNORMAL LOW (ref 150–400)
RBC: 4.51 MIL/uL (ref 3.87–5.11)
RDW: 13.3 % (ref 11.5–15.5)
WBC: 5 K/uL (ref 4.0–10.5)
nRBC: 0 % (ref 0.0–0.2)

## 2023-08-20 LAB — COMPREHENSIVE METABOLIC PANEL WITH GFR
ALT: 31 U/L (ref 0–44)
AST: 35 U/L (ref 15–41)
Albumin: 3.7 g/dL (ref 3.5–5.0)
Alkaline Phosphatase: 85 U/L (ref 38–126)
Anion gap: 14 (ref 5–15)
BUN: 11 mg/dL (ref 8–23)
CO2: 27 mmol/L (ref 22–32)
Calcium: 8.9 mg/dL (ref 8.9–10.3)
Chloride: 99 mmol/L (ref 98–111)
Creatinine, Ser: 1.08 mg/dL — ABNORMAL HIGH (ref 0.44–1.00)
GFR, Estimated: 56 mL/min — ABNORMAL LOW (ref >60)
Glucose, Bld: 239 mg/dL — ABNORMAL HIGH (ref 70–99)
Potassium: 3.7 mmol/L (ref 3.5–5.1)
Sodium: 140 mmol/L (ref 135–145)
Total Bilirubin: 0.7 mg/dL (ref 0.0–1.2)
Total Protein: 6.4 g/dL — ABNORMAL LOW (ref 6.5–8.1)

## 2023-08-24 ENCOUNTER — Ambulatory Visit (HOSPITAL_COMMUNITY)
Admission: RE | Admit: 2023-08-24 | Discharge: 2023-08-24 | Disposition: A | Discharge status: Home / Self Care | Attending: Gastroenterology | Admitting: Gastroenterology

## 2023-08-24 ENCOUNTER — Encounter (HOSPITAL_COMMUNITY)
Admission: RE | Disposition: A | Discharge status: Home / Self Care | Payer: Self-pay | Source: Home / Self Care | Attending: Gastroenterology

## 2023-08-24 ENCOUNTER — Encounter (HOSPITAL_COMMUNITY): Admitting: Anesthesiology

## 2023-08-24 ENCOUNTER — Ambulatory Visit (HOSPITAL_BASED_OUTPATIENT_CLINIC_OR_DEPARTMENT_OTHER): Admitting: Anesthesiology

## 2023-08-24 DIAGNOSIS — Z9049 Acquired absence of other specified parts of digestive tract: Secondary | ICD-10-CM | POA: Insufficient documentation

## 2023-08-24 DIAGNOSIS — K7460 Unspecified cirrhosis of liver: Secondary | ICD-10-CM | POA: Insufficient documentation

## 2023-08-24 DIAGNOSIS — E039 Hypothyroidism, unspecified: Secondary | ICD-10-CM | POA: Diagnosis not present

## 2023-08-24 DIAGNOSIS — K633 Ulcer of intestine: Secondary | ICD-10-CM

## 2023-08-24 DIAGNOSIS — Z794 Long term (current) use of insulin: Secondary | ICD-10-CM | POA: Diagnosis not present

## 2023-08-24 DIAGNOSIS — K269 Duodenal ulcer, unspecified as acute or chronic, without hemorrhage or perforation: Secondary | ICD-10-CM | POA: Diagnosis not present

## 2023-08-24 DIAGNOSIS — Z980 Intestinal bypass and anastomosis status: Secondary | ICD-10-CM | POA: Diagnosis not present

## 2023-08-24 DIAGNOSIS — Z8673 Personal history of transient ischemic attack (TIA), and cerebral infarction without residual deficits: Secondary | ICD-10-CM | POA: Insufficient documentation

## 2023-08-24 DIAGNOSIS — D509 Iron deficiency anemia, unspecified: Secondary | ICD-10-CM

## 2023-08-24 DIAGNOSIS — Z955 Presence of coronary angioplasty implant and graft: Secondary | ICD-10-CM | POA: Diagnosis not present

## 2023-08-24 DIAGNOSIS — D124 Benign neoplasm of descending colon: Secondary | ICD-10-CM | POA: Diagnosis not present

## 2023-08-24 DIAGNOSIS — F419 Anxiety disorder, unspecified: Secondary | ICD-10-CM | POA: Diagnosis not present

## 2023-08-24 DIAGNOSIS — I252 Old myocardial infarction: Secondary | ICD-10-CM | POA: Diagnosis not present

## 2023-08-24 DIAGNOSIS — Z79890 Hormone replacement therapy (postmenopausal): Secondary | ICD-10-CM | POA: Insufficient documentation

## 2023-08-24 DIAGNOSIS — K2970 Gastritis, unspecified, without bleeding: Secondary | ICD-10-CM | POA: Diagnosis not present

## 2023-08-24 DIAGNOSIS — J45909 Unspecified asthma, uncomplicated: Secondary | ICD-10-CM | POA: Insufficient documentation

## 2023-08-24 DIAGNOSIS — I2510 Atherosclerotic heart disease of native coronary artery without angina pectoris: Secondary | ICD-10-CM | POA: Diagnosis not present

## 2023-08-24 DIAGNOSIS — K3189 Other diseases of stomach and duodenum: Secondary | ICD-10-CM | POA: Diagnosis not present

## 2023-08-24 DIAGNOSIS — Z85038 Personal history of other malignant neoplasm of large intestine: Secondary | ICD-10-CM | POA: Diagnosis not present

## 2023-08-24 DIAGNOSIS — E1140 Type 2 diabetes mellitus with diabetic neuropathy, unspecified: Secondary | ICD-10-CM | POA: Diagnosis not present

## 2023-08-24 DIAGNOSIS — E7800 Pure hypercholesterolemia, unspecified: Secondary | ICD-10-CM | POA: Insufficient documentation

## 2023-08-24 DIAGNOSIS — K7581 Nonalcoholic steatohepatitis (NASH): Secondary | ICD-10-CM | POA: Diagnosis not present

## 2023-08-24 DIAGNOSIS — K635 Polyp of colon: Secondary | ICD-10-CM | POA: Diagnosis not present

## 2023-08-24 DIAGNOSIS — I5032 Chronic diastolic (congestive) heart failure: Secondary | ICD-10-CM | POA: Insufficient documentation

## 2023-08-24 DIAGNOSIS — K648 Other hemorrhoids: Secondary | ICD-10-CM | POA: Diagnosis not present

## 2023-08-24 DIAGNOSIS — Z7902 Long term (current) use of antithrombotics/antiplatelets: Secondary | ICD-10-CM | POA: Insufficient documentation

## 2023-08-24 DIAGNOSIS — K644 Residual hemorrhoidal skin tags: Secondary | ICD-10-CM | POA: Diagnosis not present

## 2023-08-24 DIAGNOSIS — K649 Unspecified hemorrhoids: Secondary | ICD-10-CM

## 2023-08-24 DIAGNOSIS — D122 Benign neoplasm of ascending colon: Secondary | ICD-10-CM | POA: Diagnosis not present

## 2023-08-24 DIAGNOSIS — Z79891 Long term (current) use of opiate analgesic: Secondary | ICD-10-CM | POA: Diagnosis not present

## 2023-08-24 DIAGNOSIS — K317 Polyp of stomach and duodenum: Secondary | ICD-10-CM | POA: Insufficient documentation

## 2023-08-24 DIAGNOSIS — I110 Hypertensive heart disease with heart failure: Secondary | ICD-10-CM | POA: Diagnosis not present

## 2023-08-24 DIAGNOSIS — G4730 Sleep apnea, unspecified: Secondary | ICD-10-CM | POA: Insufficient documentation

## 2023-08-24 DIAGNOSIS — I25119 Atherosclerotic heart disease of native coronary artery with unspecified angina pectoris: Secondary | ICD-10-CM

## 2023-08-24 HISTORY — PX: ESOPHAGOGASTRODUODENOSCOPY: SHX5428

## 2023-08-24 HISTORY — PX: COLONOSCOPY: SHX5424

## 2023-08-24 LAB — GLUCOSE, CAPILLARY: Glucose-Capillary: 128 mg/dL — ABNORMAL HIGH (ref 70–99)

## 2023-08-24 LAB — HM COLONOSCOPY

## 2023-08-24 SURGERY — COLONOSCOPY
Anesthesia: Choice

## 2023-08-24 MED ORDER — PANTOPRAZOLE SODIUM 40 MG PO TBEC: 40.0000 mg | DELAYED_RELEASE_TABLET | Freq: Two times a day (BID) | ORAL | 3 refills | Status: AC

## 2023-08-24 MED ORDER — PROPOFOL 500 MG/50ML IV EMUL
INTRAVENOUS | Status: DC | PRN
  Administered 2023-08-24: 100 ug/kg/min via INTRAVENOUS

## 2023-08-24 MED ORDER — LACTATED RINGERS IV SOLN: INTRAVENOUS | Status: DC | PRN

## 2023-08-24 MED ORDER — STERILE WATER FOR IRRIGATION IR SOLN
Status: DC | PRN
  Administered 2023-08-24: 50 mL

## 2023-08-24 MED ORDER — ACETAMINOPHEN 325 MG PO TABS
ORAL_TABLET | ORAL | Status: AC
  Filled 2023-08-24: qty 2

## 2023-08-24 MED ORDER — LIDOCAINE 2% (20 MG/ML) 5 ML SYRINGE
INTRAMUSCULAR | Status: DC | PRN
  Administered 2023-08-24: 80 mg via INTRAVENOUS

## 2023-08-24 MED ORDER — PROPOFOL 10 MG/ML IV BOLUS
INTRAVENOUS | Status: DC | PRN
Start: 2023-08-24 — End: 2023-08-24
  Administered 2023-08-24: 50 mg via INTRAVENOUS

## 2023-08-24 MED ORDER — ACETAMINOPHEN 325 MG PO TABS
650.0000 mg | ORAL_TABLET | Freq: Once | ORAL | Status: AC
  Administered 2023-08-24: 650 mg via ORAL

## 2023-08-24 MED ORDER — ONDANSETRON HCL 4 MG/2ML IJ SOLN
INTRAMUSCULAR | Status: DC | PRN
  Administered 2023-08-24: 4 mg via INTRAVENOUS

## 2023-08-24 NOTE — Discharge Instructions (Addendum)
 You are being discharged to home.  Resume your previous diet.  We are waiting for your pathology results.  Take Protonix  (pantoprazole ) 40 mg by mouth twice a day.  Do not take any ibuprofen (including Advil, Motrin or Nuprin), naproxen, or other non-steroidal anti-inflammatory drugs.  Your physician has recommended a repeat upper endoscopy in two years for surveillance.  Your physician has recommended a repeat colonoscopy for surveillance based on pathology results.  Continue oral iron.

## 2023-08-24 NOTE — Op Note (Signed)
 [facility] Patient Name: [name] Procedure Date: 08/24/2023 11:18 AM MRN: [id number] Date of Birth: 03/25/1955 Attending MD: Toribio Fortune , , [id number] CSN: [id number] Age: 68 Admit Type: Outpatient Procedure:                Colonoscopy Indications:              Iron deficiency anemia, Follow-up of colorectal                            cancer Providers:                Toribio Fortune, Jon LABOR. Gerome RN, RN, Bascom Blush Referring MD:              Medicines:                Monitored Anesthesia Care Complications:            No immediate complications. Estimated Blood Loss:     Estimated blood loss: none. Procedure:                Pre-Anesthesia Assessment:                           - Prior to the procedure, a History and Physical                            was performed, and patient medications, allergies                            and sensitivities were reviewed. The patient's                            tolerance of previous anesthesia was reviewed.                           - The risks and benefits of the procedure and the                            sedation options and risks were discussed with the                            patient. All questions were answered and informed                            consent was obtained.                           After obtaining informed consent, the colonoscope                            was passed under direct vision. Throughout the                            procedure, the patient's blood pressure, pulse, and  oxygen  saturations were monitored continuously. The                            [id number]-HQ190L ([id number]) scope was introduced through                            the anus and advanced to the the terminal ileum.                            The colonoscopy was performed without difficulty.                            The patient tolerated the procedure well. The                             quality of the bowel preparation was good. Scope In: 11:51:37 AM Scope Out: 12:06:37 PM Scope Withdrawal Time: 0 hours 11 minutes 30 seconds  Total Procedure Duration: 0 hours 15 minutes 0 seconds  Findings:      Hemorrhoids were found on perianal exam.      The neo-terminal ileum appeared normal.      There was evidence of a prior end-to-side ileo-colonic anastomosis in       the ascending colon. This was patent and was characterized by healthy       appearing mucosa. The anastomosis was traversed.      A 4 mm polyp was found in the ascending colon. The polyp was sessile.       The polyp was removed with a cold snare. Resection and retrieval were       complete.      A 1 mm polyp was found in the ascending colon. The polyp was sessile.       The polyp was removed with a cold biopsy forceps. Resection and       retrieval were complete.      Two sessile polyps were found in the descending colon. The polyps were 2       to 3 mm in size. These polyps were removed with a cold snare. Resection       and retrieval were complete.      Non-bleeding external and internal hemorrhoids were found during       retroflexion and during perianal exam. The hemorrhoids were small. Impression:               - Hemorrhoids found on perianal exam.                           - The examined portion of the ileum was normal.                           - Patent end-to-side ileo-colonic anastomosis,                            characterized by healthy appearing mucosa.                           - One 4 mm polyp in the ascending colon, removed  with a cold snare. Resected and retrieved.                           - One 1 mm polyp in the ascending colon, removed                            with a cold biopsy forceps. Resected and retrieved.                           - Two 2 to 3 mm polyps in the descending colon,                            removed with a cold snare. Resected and  retrieved.                           - Non-bleeding external and internal hemorrhoids. Moderate Sedation:      Per Anesthesia Care Recommendation:           - Discharge patient to home (ambulatory).                           - Resume previous diet.                           - Await pathology results.                           - Repeat colonoscopy for surveillance based on                            pathology results.                           - Continue oral iron. Procedure Code(s):        --- Professional ---                           [phone], Colonoscopy, flexible; with removal of                            tumor(s), polyp(s), or other lesion(s) by snare                            technique                           45380, 59, Colonoscopy, flexible; with biopsy,                            single or multiple Diagnosis Code(s):        --- Professional ---                           X35.1, Other hemorrhoids                           Z98.0, Intestinal bypass  and anastomosis status                           D12.2, Benign neoplasm of ascending colon                           D12.4, Benign neoplasm of descending colon                           D50.9, Iron deficiency anemia, unspecified                           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 All rights reserved. The codes documented in this report are preliminary and upon coder review may  be revised to meet current compliance requirements. Toribio Fortune, MD Toribio Fortune,  08/24/2023 12:14:54 PM This report has been signed electronically. Number of Addenda: 0

## 2023-08-24 NOTE — Transfer of Care (Signed)
 Immediate Anesthesia Transfer of Care Note  Patient: [name]  Procedure(s) Performed: COLONOSCOPY EGD (ESOPHAGOGASTRODUODENOSCOPY)  Patient Location: PACU  Anesthesia Type:MAC  Level of Consciousness: awake and alert   Airway & Oxygen  Therapy: Patient Spontanous Breathing and Patient connected to nasal cannula oxygen   Post-op Assessment: Report given to RN and Post -op Vital signs reviewed and stable  Post vital signs: Reviewed and stable  Last Vitals:  Vitals Value Taken Time  BP 118/46 08/24/23 12:13  Temp 36.5 C 08/24/23 12:13  Pulse 75 08/24/23 12:13  Resp 13 08/24/23 12:13  SpO2 99 % 08/24/23 12:13    Last Pain:  Vitals:   08/24/23 1213  TempSrc: Axillary  PainSc: 0-No pain      Patients Stated Pain Goal: 5 (08/24/23 0841)  Complications: No notable events documented.

## 2023-08-24 NOTE — Op Note (Signed)
 [facility] Patient Name: [name] Procedure Date: 08/24/2023 11:20 AM MRN: [id number] Date of Birth: 03-Jan-1956 Attending MD: Toribio Fortune , , [id number] CSN: [id number] Age: 68 Admit Type: Outpatient Procedure:                Upper GI endoscopy Indications:              Iron deficiency anemia Providers:                Toribio Fortune, Jon LABOR. Gerome RN, RN, Bascom Blush Referring MD:              Medicines:                Monitored Anesthesia Care Complications:            No immediate complications. Estimated Blood Loss:     Estimated blood loss: none. Procedure:                Pre-Anesthesia Assessment:                           - Prior to the procedure, a History and Physical                            was performed, and patient medications, allergies                            and sensitivities were reviewed. The patient's                            tolerance of previous anesthesia was reviewed.                           - The risks and benefits of the procedure and the                            sedation options and risks were discussed with the                            patient. All questions were answered and informed                            consent was obtained.                           - ASA Grade Assessment: III - A patient with severe                            systemic disease.                           After obtaining informed consent, the endoscope was                            passed under direct vision. Throughout the  procedure, the patient's blood pressure, pulse, and                            oxygen  saturations were monitored continuously. The                            [id number]-[id number] ([id number]) scope was introduced through the                            mouth, and advanced to the second part of duodenum.                            The upper GI endoscopy was accomplished without                             difficulty. The patient tolerated the procedure                            well. Scope In: 11:33:39 AM Scope Out: 11:46:46 AM Total Procedure Duration: 0 hours 13 minutes 7 seconds  Findings:      The examined esophagus was normal.      The Z-line was regular and was found 40 cm from the incisors.      The entire examined stomach was normal. Biopsies were taken with a cold       forceps for Helicobacter pylori testing. Notably, the greater curvature       lip of the pylorus was everted towards the dudodenum. No masses were       seen upon inspection after depression was performed with the forceps.      A single 3 mm sessile polyp was found at the pylorus. The polyp was       removed with a cold biopsy forceps. Resection and retrieval were       complete.      One non-bleeding superficial duodenal ulcer was found in the first       portion of the duodenum. The lesion was 5 mm in largest dimension. The       normal small bowel was biopsied with a cold forceps for histology. Impression:               - Normal esophagus.                           - Z-line regular, 40 cm from the incisors.                           - Normal stomach. Biopsied.                           - A single gastric polyp. Resected and retrieved.                           - Non-bleeding duodenal ulcer. Biopsied normal                            mucosa. Moderate Sedation:      Per Anesthesia Care Recommendation:           -  Discharge patient to home (ambulatory).                           - Resume previous diet.                           - Await pathology results.                           - Use Protonix  (pantoprazole ) 40 mg PO BID.                           - No ibuprofen, naproxen, or other non-steroidal                            anti-inflammatory drugs.                           - Repeat upper endoscopy in 2 years for                            surveillance. Procedure Code(s):        --- Professional  ---                           [phone], Esophagogastroduodenoscopy, flexible,                            transoral; with biopsy, single or multiple Diagnosis Code(s):        --- Professional ---                           K31.7, Polyp of stomach and duodenum                           K26.9, Duodenal ulcer, unspecified as acute or                            chronic, without hemorrhage or perforation                           D50.9, Iron deficiency anemia, unspecified CPT copyright 2022 American Medical Association. All rights reserved. The codes documented in this report are preliminary and upon coder review may  be revised to meet current compliance requirements. Toribio Fortune, MD Toribio Fortune,  08/24/2023 12:09:43 PM This report has been signed electronically. Number of Addenda: 0
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
## 2023-08-25 ENCOUNTER — Encounter (INDEPENDENT_AMBULATORY_CARE_PROVIDER_SITE_OTHER): Payer: Self-pay | Admitting: *Deleted

## 2023-08-25 ENCOUNTER — Encounter (HOSPITAL_COMMUNITY): Payer: Self-pay | Admitting: Gastroenterology

## 2023-08-25 NOTE — Anesthesia Preprocedure Evaluation (Signed)
 Anesthesia Evaluation  Patient identified by MRN, date of birth, ID band Patient awake    Reviewed: Allergy & Precautions, H&P , NPO status , Patient's Chart, lab work & pertinent test results, reviewed documented beta blocker date and time   History of Anesthesia Complications (+) PONV and history of anesthetic complications  Airway Mallampati: II  TM Distance: >3 FB Neck ROM: full    Dental no notable dental hx.    Pulmonary neg pulmonary ROS, asthma , sleep apnea    Pulmonary exam normal breath sounds clear to auscultation       Cardiovascular Exercise Tolerance: Good hypertension, + angina  + CAD and + Past MI  negative cardio ROS  Rhythm:regular Rate:Normal     Neuro/Psych  PSYCHIATRIC DISORDERS Anxiety Depression    CVA negative neurological ROS  negative psych ROS   GI/Hepatic negative GI ROS, Neg liver ROS,GERD  ,,(+) Hepatitis -  Endo/Other  negative endocrine ROSdiabetesHypothyroidism    Renal/GU Renal diseasenegative Renal ROS  negative genitourinary   Musculoskeletal   Abdominal   Peds  Hematology negative hematology ROS (+) Blood dyscrasia, anemia   Anesthesia Other Findings   Reproductive/Obstetrics negative OB ROS                              Anesthesia Physical Anesthesia Plan  ASA: 3  Anesthesia Plan: General   Post-op Pain Management:    Induction:   PONV Risk Score and Plan: Propofol  infusion  Airway Management Planned:   Additional Equipment:   Intra-op Plan:   Post-operative Plan:   Informed Consent: I have reviewed the patients History and Physical, chart, labs and discussed the procedure including the risks, benefits and alternatives for the proposed anesthesia with the patient or authorized representative who has indicated his/her understanding and acceptance.     Dental Advisory Given  Plan Discussed with: CRNA  Anesthesia Plan Comments:          Anesthesia Quick Evaluation

## 2023-08-25 NOTE — Anesthesia Postprocedure Evaluation (Signed)
 Anesthesia Post Note  Patient: VIRLEE STROSCHEIN  Procedure(s) Performed: COLONOSCOPY EGD (ESOPHAGOGASTRODUODENOSCOPY)  Patient location during evaluation: Phase II Anesthesia Type: General Level of consciousness: awake Pain management: pain level controlled Vital Signs Assessment: post-procedure vital signs reviewed and stable Respiratory status: spontaneous breathing and respiratory function stable Cardiovascular status: blood pressure returned to baseline and stable Postop Assessment: no headache and no apparent nausea or vomiting Anesthetic complications: no Comments: Late entry   No notable events documented.   Last Vitals:  Vitals:   08/24/23 0841 08/24/23 1213  BP: (!) 161/69 (!) 118/46  Pulse: 64 75  Resp: 16 13  Temp: 36.8 C 36.5 C  SpO2: 97% 99%    Last Pain:  Vitals:   08/24/23 1213  TempSrc: Axillary  PainSc: 0-No pain                 Yvonna JINNY Bosworth

## 2023-08-26 LAB — SURGICAL PATHOLOGY

## 2023-08-27 ENCOUNTER — Ambulatory Visit (INDEPENDENT_AMBULATORY_CARE_PROVIDER_SITE_OTHER): Payer: Self-pay | Admitting: Gastroenterology

## 2023-08-27 NOTE — Progress Notes (Signed)
 5 yr TCS noted in recall 2 yr EGD noted in recall Patient result letter mailed procedure note and pathology result faxed to PCP

## 2023-09-08 ENCOUNTER — Other Ambulatory Visit: Payer: Self-pay

## 2023-09-08 DIAGNOSIS — I10 Essential (primary) hypertension: Secondary | ICD-10-CM

## 2023-09-08 DIAGNOSIS — Z79899 Other long term (current) drug therapy: Secondary | ICD-10-CM

## 2023-09-08 DIAGNOSIS — R079 Chest pain, unspecified: Secondary | ICD-10-CM

## 2023-09-08 DIAGNOSIS — I25110 Atherosclerotic heart disease of native coronary artery with unstable angina pectoris: Secondary | ICD-10-CM

## 2023-09-08 DIAGNOSIS — Z8673 Personal history of transient ischemic attack (TIA), and cerebral infarction without residual deficits: Secondary | ICD-10-CM

## 2023-09-08 DIAGNOSIS — I6381 Other cerebral infarction due to occlusion or stenosis of small artery: Secondary | ICD-10-CM

## 2023-09-08 DIAGNOSIS — E785 Hyperlipidemia, unspecified: Secondary | ICD-10-CM

## 2023-09-08 MED ORDER — FUROSEMIDE 40 MG PO TABS: 60.0000 mg | ORAL_TABLET | Freq: Every day | ORAL | 3 refills | Status: AC

## 2023-09-27 ENCOUNTER — Encounter (INDEPENDENT_AMBULATORY_CARE_PROVIDER_SITE_OTHER): Payer: Self-pay | Admitting: Gastroenterology

## 2023-09-27 ENCOUNTER — Ambulatory Visit (INDEPENDENT_AMBULATORY_CARE_PROVIDER_SITE_OTHER): Admitting: Gastroenterology

## 2023-09-27 VITALS — BP 146/73 | HR 67 | Temp 97.5°F | Ht 63.0 in | Wt 209.6 lb

## 2023-09-27 DIAGNOSIS — K7460 Unspecified cirrhosis of liver: Secondary | ICD-10-CM

## 2023-09-27 DIAGNOSIS — R109 Unspecified abdominal pain: Secondary | ICD-10-CM | POA: Insufficient documentation

## 2023-09-27 DIAGNOSIS — K219 Gastro-esophageal reflux disease without esophagitis: Secondary | ICD-10-CM | POA: Diagnosis not present

## 2023-09-27 DIAGNOSIS — R197 Diarrhea, unspecified: Secondary | ICD-10-CM | POA: Insufficient documentation

## 2023-09-27 DIAGNOSIS — R1012 Left upper quadrant pain: Secondary | ICD-10-CM

## 2023-09-27 DIAGNOSIS — R112 Nausea with vomiting, unspecified: Secondary | ICD-10-CM

## 2023-09-27 DIAGNOSIS — K7581 Nonalcoholic steatohepatitis (NASH): Secondary | ICD-10-CM

## 2023-09-27 DIAGNOSIS — R142 Eructation: Secondary | ICD-10-CM | POA: Diagnosis not present

## 2023-09-27 MED ORDER — DICYCLOMINE HCL 10 MG PO CAPS: 10.0000 mg | ORAL_CAPSULE | Freq: Two times a day (BID) | ORAL | 0 refills | Status: AC | PRN

## 2023-09-27 NOTE — Patient Instructions (Addendum)
-   Perform stool workup - Start Bentyl  1 tablet q12h as needed for abdominal pain or abdominal discomfort - Reduce salt intake to <2 g per day - Can take Tylenol  max of 2 g per day (650 mg q8h) for pain - Avoid NSAIDs for pain - Avoid eating raw oysters/shellfish - Protein shake (Ensure or Boost) every night before going to sleep
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
## 2023-11-10 ENCOUNTER — Encounter (INDEPENDENT_AMBULATORY_CARE_PROVIDER_SITE_OTHER): Payer: Self-pay | Admitting: *Deleted

## 2023-11-17 ENCOUNTER — Encounter (INDEPENDENT_AMBULATORY_CARE_PROVIDER_SITE_OTHER): Payer: Self-pay | Admitting: Gastroenterology

## 2023-11-21 ENCOUNTER — Other Ambulatory Visit: Payer: Self-pay | Admitting: Urology

## 2023-11-21 DIAGNOSIS — N3021 Other chronic cystitis with hematuria: Secondary | ICD-10-CM

## 2023-11-22 ENCOUNTER — Other Ambulatory Visit: Payer: Self-pay | Admitting: Cardiovascular Disease

## 2023-11-22 ENCOUNTER — Other Ambulatory Visit (HOSPITAL_COMMUNITY): Payer: Self-pay

## 2023-11-22 MED ORDER — METOPROLOL SUCCINATE ER 100 MG PO TB24
100.0000 mg | ORAL_TABLET | Freq: Every day | ORAL | 3 refills | Status: AC
Start: 2023-11-22 — End: ?
  Filled 2023-11-22: qty 90, 90d supply, fill #0

## 2023-11-22 MED ORDER — LOSARTAN POTASSIUM 25 MG PO TABS
25.0000 mg | ORAL_TABLET | Freq: Every day | ORAL | 3 refills | Status: AC
  Filled 2023-11-22: qty 90, 90d supply, fill #0

## 2023-11-22 MED ORDER — PRASUGREL HCL 10 MG PO TABS
10.0000 mg | ORAL_TABLET | Freq: Every day | ORAL | 3 refills | Status: AC
  Filled 2023-11-22: qty 90, 90d supply, fill #0

## 2023-11-22 NOTE — Telephone Encounter (Signed)
*  STAT* If patient is at the pharmacy, call can be transferred to refill team.   1. Which medications need to be refilled? (please list name of each medication and dose if known)  losartan  (COZAAR ) 25 MG tablet potassium chloride  SA (K-DUR,KLOR-CON ) 20 MEQ tablet prasugrel  (EFFIENT ) 10 MG TABS tablet metoprolol  succinate (TOPROL -XL) 100 MG 24 hr tablet  2. Which pharmacy/location (including street and city if local pharmacy) is medication to be sent to? [id number] - [place], [place] - [address]  3. Do they need a 30 day or 90 day supply?   90 day supply

## 2023-11-22 NOTE — Telephone Encounter (Signed)
 Pt of Dr. Verlin. This RX was not prescribed or refilled by Dr. Verlin. Is this something that Dr. Verlin wants to refill? Please advise.

## 2023-11-23 NOTE — Telephone Encounter (Signed)
 Potassium refill should be sent to patient's PCP.  [facility] spoke with [facility] and they will send refill request to PCP

## 2023-12-01 ENCOUNTER — Other Ambulatory Visit (HOSPITAL_COMMUNITY): Payer: Self-pay
